# Patient Record
Sex: Female | Born: 1995
Health system: Southern US, Community
[De-identification: ages and names within clinical notes are randomized; demographics above are authoritative.]

## PROBLEM LIST (undated history)

## (undated) ENCOUNTER — Ambulatory Visit (HOSPITAL_COMMUNITY): Disposition: A | Discharge status: Home / Self Care | Payer: Medicaid Other

## (undated) DIAGNOSIS — M2142 Flat foot [pes planus] (acquired), left foot: Secondary | ICD-10-CM

## (undated) DIAGNOSIS — N83209 Unspecified ovarian cyst, unspecified side: Secondary | ICD-10-CM

## (undated) DIAGNOSIS — F4310 Post-traumatic stress disorder, unspecified: Secondary | ICD-10-CM

## (undated) DIAGNOSIS — F329 Major depressive disorder, single episode, unspecified: Secondary | ICD-10-CM

## (undated) DIAGNOSIS — F32A Depression, unspecified: Secondary | ICD-10-CM

## (undated) DIAGNOSIS — M2141 Flat foot [pes planus] (acquired), right foot: Secondary | ICD-10-CM

## (undated) DIAGNOSIS — Z9109 Other allergy status, other than to drugs and biological substances: Secondary | ICD-10-CM

## (undated) DIAGNOSIS — F1290 Cannabis use, unspecified, uncomplicated: Secondary | ICD-10-CM

## (undated) DIAGNOSIS — J4540 Moderate persistent asthma, uncomplicated: Secondary | ICD-10-CM

## (undated) DIAGNOSIS — R1115 Cyclical vomiting syndrome unrelated to migraine: Secondary | ICD-10-CM

## (undated) HISTORY — PX: NOSE SURGERY: SHX723

## (undated) HISTORY — DX: Flat foot (pes planus) (acquired), right foot: M21.41

## (undated) HISTORY — DX: Moderate persistent asthma, uncomplicated: J45.40

## (undated) HISTORY — DX: Flat foot (pes planus) (acquired), right foot: M21.42

## (undated) HISTORY — DX: Cannabis use, unspecified, uncomplicated: F12.90

## (undated) HISTORY — DX: Other allergy status, other than to drugs and biological substances: Z91.09

---
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
## 2014-05-07 ENCOUNTER — Emergency Department (HOSPITAL_COMMUNITY)
Admission: EM | Admit: 2014-05-07 | Discharge: 2014-05-07 | Disposition: A | Discharge status: Home / Self Care | Payer: Medicaid Other | Attending: Emergency Medicine | Admitting: Emergency Medicine

## 2014-05-07 ENCOUNTER — Encounter (HOSPITAL_COMMUNITY): Payer: Self-pay | Admitting: Emergency Medicine

## 2014-05-07 DIAGNOSIS — Z8739 Personal history of other diseases of the musculoskeletal system and connective tissue: Secondary | ICD-10-CM | POA: Diagnosis not present

## 2014-05-07 DIAGNOSIS — Z8659 Personal history of other mental and behavioral disorders: Secondary | ICD-10-CM | POA: Diagnosis not present

## 2014-05-07 DIAGNOSIS — Y939 Activity, unspecified: Secondary | ICD-10-CM | POA: Diagnosis not present

## 2014-05-07 DIAGNOSIS — Y929 Unspecified place or not applicable: Secondary | ICD-10-CM | POA: Insufficient documentation

## 2014-05-07 DIAGNOSIS — Z87891 Personal history of nicotine dependence: Secondary | ICD-10-CM | POA: Insufficient documentation

## 2014-05-07 DIAGNOSIS — S70361A Insect bite (nonvenomous), right thigh, initial encounter: Secondary | ICD-10-CM

## 2014-05-07 DIAGNOSIS — J45909 Unspecified asthma, uncomplicated: Secondary | ICD-10-CM | POA: Insufficient documentation

## 2014-05-07 DIAGNOSIS — T148 Other injury of unspecified body region: Secondary | ICD-10-CM | POA: Diagnosis present

## 2014-05-07 DIAGNOSIS — S90569A Insect bite (nonvenomous), unspecified ankle, initial encounter: Secondary | ICD-10-CM | POA: Diagnosis not present

## 2014-05-07 DIAGNOSIS — W57XXXA Bitten or stung by nonvenomous insect and other nonvenomous arthropods, initial encounter: Principal | ICD-10-CM

## 2014-05-07 MED ORDER — SULFAMETHOXAZOLE-TRIMETHOPRIM 800-160 MG PO TABS: 1.0000 | ORAL_TABLET | Freq: Two times a day (BID) | ORAL | Status: DC

## 2014-05-07 MED ORDER — CEPHALEXIN 500 MG PO CAPS: ORAL_CAPSULE | ORAL | Status: DC

## 2014-05-07 NOTE — ED Notes (Signed)
Patient states she might have a spider bite to the right thigh at the tatoo  States it is bruising and it hurts

## 2014-05-07 NOTE — ED Provider Notes (Signed)
CSN: [id number]     Arrival date & time 05/07/14  1921 History   This chart was scribed for non-physician provider Allen DerryMercedes Camprubi-Soms, PA-C, working with Shanna CiscoMegan E Docherty, MD by Phillis HaggisGabriella Gaje, ED Scribe. This patient was seen in room TR08C/TR08C and patient care was started at 7:55 PM.     Chief Complaint  Patient presents with  . Insect Bite   Patient is a 18 y.o. female presenting with animal bite. The history is provided by the patient. No language interpreter was used.  Animal Bite Contact animal:  Insect Location:  Leg Leg injury location:  R upper leg Time since incident:  1 day Pain details:    Quality:  Stinging   Severity:  Mild   Timing:  Constant   Progression:  Worsening Incident location:  Unable to specify Relieved by:  None tried Worsened by:  Nothing tried Ineffective treatments: alcohol rub. Associated symptoms: no fever, no numbness, no rash and no swelling    HPI Comments: Mia Johnson is a 18 y.o. female who presents to the Emergency Department complaining of an insect bite on her right upper thigh onset one day ago, reporting some redness and tenderness to the area. She reports that she does not remember being bit, and noticed the area yesterday, although the pain started only today. She said that the area has spread slightly over the past day. She reports the pain is 5/10, feels like it is "stinging", non-radiating. She states that the pain is only worsened when the area is palpated. She states that she rubbed alcohol on the affected area to no relief. She denies fevers/chills, abd pain, N/V/D, numbness, weakness, headache, blurry vision, myalgias or arthralgias. Denies warmth to the area or itching.  She has a tattoo at the area and was concerned that it might be infected. She denies any other medical problems.    Past Medical History  Diagnosis Date  . Asthma, moderate persistent   . Pes planus (flat feet)   . Environmental allergies   . Marijuana  smoker   . Abdominal pain   . Depression   . PTSD (post-traumatic stress disorder)    Past Surgical History  Procedure Laterality Date  . Nose surgery     Family History  Problem Relation Age of Onset  . Obesity Mother    History  Substance Use Topics  . Smoking status: Former Smoker    Quit date: 03/07/2014  . Smokeless tobacco: Never Used     Comment: smokes marajuana at times  . Alcohol Use: Yes     Comment: ocassionally   OB History   Grav Para Term Preterm Abortions TAB SAB Ect Mult Living                 Review of Systems  Constitutional: Negative for fever and chills.  Eyes: Negative for pain and visual disturbance.  Respiratory: Negative for chest tightness, shortness of breath and wheezing.   Cardiovascular: Negative for chest pain and leg swelling.  Gastrointestinal: Negative for nausea, vomiting, abdominal pain and diarrhea.  Musculoskeletal: Negative for arthralgias, back pain, joint swelling and myalgias.  Skin: Negative for rash.  Neurological: Negative for weakness, numbness and headaches.      Allergies  Shellfish-derived products  Home Medications   Prior to Admission medications   Medication Sig Start Date End Date Taking? Authorizing Provider  cephALEXin (KEFLEX) 500 MG capsule 2 caps po bid x 7 days 05/07/14   Donnita FallsMercedes Strupp Camprubi-Soms, PA-C  pseudoephedrine (  SUDAFED) 60 MG tablet Take 1 tablet (60 mg total) by mouth every 6 (six) hours as needed for congestion. 12/14/13   Vale HavenKeli L Beck, MD  sulfamethoxazole-trimethoprim (BACTRIM DS,SEPTRA DS) 800-160 MG per tablet Take 1 tablet by mouth 2 (two) times daily. 05/07/14   Benedetto Ryder Strupp Camprubi-Soms, PA-C   BP 146/78  Pulse 57  Temp(Src) 98.7 F (37.1 C) (Oral)  Resp 18  Ht 5\' 9"  (1.753 m)  Wt 132 lb 6 oz (60.045 kg)  BMI 19.54 kg/m2  SpO2 98%  LMP 04/22/2014 Physical Exam  Nursing note and vitals reviewed. Constitutional: She is oriented to person, place, and time. Vital signs are  normal. She appears well-developed and well-nourished. No distress.  HENT:  Head: Normocephalic and atraumatic.  Mouth/Throat: Mucous membranes are normal.  Eyes: Conjunctivae and EOM are normal. Pupils are equal, round, and reactive to light.  Neck: Normal range of motion. Neck supple.  Cardiovascular: Normal rate and normal pulses.   Pulmonary/Chest: Effort normal. No respiratory distress. She exhibits no tenderness.  Abdominal: Normal appearance.  Musculoskeletal: Normal range of motion.  FROM intact in all extremities  Neurological: She is alert and oriented to person, place, and time. She has normal strength. No sensory deficit.  Skin: Skin is warm, dry and intact. There is erythema.     Small area <1cm on mid-upper right thigh of trace erythema, with no edema, heat or TTP. No fluctuance or induration. No urticaria, pustules, or vesicles. No other rashes over exposed areas. No bite mark or puncture marks.  Psychiatric: She has a normal mood and affect. Her behavior is normal.    ED Course  Procedures (including critical care time) DIAGNOSTIC STUDIES: Oxygen Saturation is 98% on room air, normal by my interpretation.    COORDINATION OF CARE: 8:00 PM-Discussed treatment plan which includes antibiotics and f/u with PCP with pt at bedside and pt agreed to plan.    Labs Review Labs Reviewed - No data to display  Imaging Review No results found.   EKG Interpretation None      MDM   Final diagnoses:  Insect bite of thigh, right, initial encounter    Mia Johnson is a 18 y.o. female presenting s/p unknown insect bite to R thigh 1 day ago. Subjective reports of increased redness. Exam unremarkable for puncture wound, there is mild trace erythema to area with no TTP and with no obvious abscess. Will tx as cellulitis given pt concern with infection due to location near tattoo.Will have pt f/up with PCP. Pt not itching, therefore doubt scabies, and no need for  hydrocortisone now. I explained the diagnosis and have given explicit precautions to return to the ER including for any other new or worsening symptoms. The patient understands and accepts the medical plan as it's been dictated and I have answered their questions. Discharge instructions concerning home care and prescriptions have been given. The patient is STABLE and is discharged to home in good condition.  I personally performed the services described in this documentation, which was scribed in my presence. The recorded information has been reviewed and is accurate.  BP 146/78  Pulse 57  Temp(Src) 98.7 F (37.1 C) (Oral)  Resp 18  Ht 5\' 9"  (1.753 m)  Wt 132 lb 6 oz (60.045 kg)  BMI 19.54 kg/m2  SpO2 98%  LMP 04/22/2014    Donnita FallsMercedes Strupp Camprubi-Soms, PA-C 05/08/14 [phone]

## 2014-05-07 NOTE — Discharge Instructions (Signed)
Keep area clean and dry. Apply warm compresses to affected area throughout the day. Take antibiotic until it is finished. Followup with your Primary Care doctor in 7 days for wound recheck.  Return to emergency department for emergent changing or worsening symptoms.   Insect Bite Mosquitoes, flies, fleas, bedbugs, and other insects can bite. Insect bites are different from insect stings. The bite may be red, puffy (swollen), and itchy for 2 to 4 days. Most bites get better on their own. HOME CARE   Do not scratch the bite.  Keep the bite clean and dry. Wash the bite with soap and water.  Put ice on the bite.  Put ice in a plastic bag.  Place a towel between your skin and the bag.  Leave the ice on for 20 minutes, 4 times a day. Do this for the first 2 to 3 days, or as told by your doctor.  You may use medicated lotions or creams to lessen itching as told by your doctor.  Only take medicines as told by your doctor.  If you are given medicines (antibiotics), take them as told. Finish them even if you start to feel better. You may need a tetanus shot if:  You cannot remember when you had your last tetanus shot.  You have never had a tetanus shot.  The injury broke your skin. If you need a tetanus shot and you choose not to have one, you may get tetanus. Sickness from tetanus can be serious. GET HELP RIGHT AWAY IF:   You have more pain, redness, or puffiness.  You see a red line on the skin coming from the bite.  You have a fever.  You have joint pain.  You have a headache or neck pain.  You feel weak.  You have a rash.  You have chest pain, or you are short of breath.  You have belly (abdominal) pain.  You feel sick to your stomach (nauseous) or throw up (vomit).  You feel very tired or sleepy. MAKE SURE YOU:   Understand these instructions.  Will watch your condition.  Will get help right away if you are not doing well or get worse. Document Released:  10/18/2000 Document Revised: 01/13/2012 Document Reviewed: 05/22/2011 [facility] Patient Information 2015 [employer] [place]. This information is not intended to replace advice given to you by your health care provider. Make sure you discuss any questions you have with your health care provider.

## 2014-05-08 ENCOUNTER — Telehealth: Payer: Self-pay | Admitting: Family Medicine

## 2014-05-08 NOTE — ED Provider Notes (Signed)
Medical screening examination/treatment/procedure(s) were performed by non-physician practitioner and as supervising physician I was immediately available for consultation/collaboration.  Shanna CiscoMegan E Kilynn Fitzsimmons, MD 05/08/14 [phone]

## 2014-05-08 NOTE — Telephone Encounter (Signed)
Mia Johnson is a 18 y.o. female calls because she feels her tattoo may be infected. She states her tattoo is 531 1/19 years old and she gave it to herself.  She states she was seen in the ED yesterday and was told it was not infected and then given 2 antibiotics. She has concerns that she has advanced to a blood stream infection because it is starting to "bruise" through her tattoo (more than when seen yesterday). She endorses no appetite, with nausea, vomit when she attempts to eat and chills. She denies diarrhea.  - With new onset of symptoms mild concern for infection advancement. encouraged pt to be seen in an urgent care or ED and they will perform blood work and re-evaluate her skin infection.  Kuneff, Renee DO

## 2014-07-20 ENCOUNTER — Encounter: Payer: Self-pay | Admitting: Family Medicine

## 2014-08-02 ENCOUNTER — Ambulatory Visit (INDEPENDENT_AMBULATORY_CARE_PROVIDER_SITE_OTHER): Payer: Medicaid Other | Admitting: Family Medicine

## 2014-08-02 ENCOUNTER — Encounter: Payer: Self-pay | Admitting: Family Medicine

## 2014-08-02 VITALS — BP 106/64 | HR 87 | Temp 98.2°F | Wt 126.0 lb

## 2014-08-02 DIAGNOSIS — Z23 Encounter for immunization: Secondary | ICD-10-CM | POA: Diagnosis not present

## 2014-08-02 DIAGNOSIS — G43909 Migraine, unspecified, not intractable, without status migrainosus: Secondary | ICD-10-CM

## 2014-08-02 DIAGNOSIS — Z0000 Encounter for general adult medical examination without abnormal findings: Secondary | ICD-10-CM | POA: Diagnosis not present

## 2014-08-02 DIAGNOSIS — Z00129 Encounter for routine child health examination without abnormal findings: Secondary | ICD-10-CM

## 2014-08-02 NOTE — Progress Notes (Signed)
Patient ID: Mia Johnson, female   DOB: 05/17/1996, 18 y.o.   MRN: [id number]   Subjective:    Patient ID: Mia Johnson, female    DOB: 09/09/1996, 18 y.o.   MRN: [id number]  HPI  CC: to be "checked all over"  # Annual:  Due for flu shot  Sexually active with female only (girlfriend), declines STD testing as she says she was negative before dating her current GF and they are "on the same page" so she is not worried about infection ROS: no CP, no SOB, +myalgias x 1 day back of legs, no dysuria  # Headaches / eyes rolling  Has long history of migraines and seen before for this  Location: primarily right front behind eye.  Associated symptoms: right ear tinnitus, photophobia, phonophobia  Currently takes only ibuprofen  Had episode 2 days ago where her "eyes rolled in the back of my head" when she was trying to go to sleep  2 weeks ago had "lazy" eye: girlfriend says her left eye deviated left and when she told patient it returned to normal. Patient denies any diplopia or vision changes when this occurred. ROS: no slurred speech, no syncope, no seizure, no weakness/numbness of extremities, no changes in vision, +possible change in taste (no longer enjoys sweets)  Review of Systems   See HPI for ROS. All other systems reviewed and are negative.  Past medical history, surgical, family, and social history reviewed and updated in the EMR as appropriate. Objective:  BP 106/64  Pulse 87  Temp(Src) 98.2 F (36.8 C) (Oral)  Wt 126 lb (57.153 kg)  LMP 07/14/2014 Vitals reviewed  General: NAD, thin appearing AA female HEENT: PERRL, EOMI. MMM.  Neck: supple, FROM CV: RRR, normal s1 and s2, no murmurs. 2+ radial and PT pulses bilat Resp: CTAB, normal effort Abdomen: thin, soft, nontender, nondistended, normal bowel sounds Ext: no edema or cyanosis, WWP. Neuro: alert and oriented. CN2-12 normal. Finger to nose, heel-to-shin normal. No pronator drift. Strength 5/5  bilaterally in: arm flexion/extension, finger abduction/adduction, leg extension/flexion. Gait is normal.  Assessment & Plan:  See Problem List Documentation

## 2014-08-02 NOTE — Assessment & Plan Note (Signed)
Pt with strong history of migraine and endorsing continued symptoms, with also some recent ocular sequelae that may be related. She is concerned about possible brain tumor, however her neuro exam is completely normal today. I have asked her to take a daily headache log and f/u in 1 month to discuss this and possibly initiate daily prophylaxis. On chart review it appears she has taken imitrex once in the past and trazodone as well.

## 2014-08-02 NOTE — Patient Instructions (Signed)
Headache diary:  Date, time of headache Location of headache, type of pain Associated symptoms (ear ringing, changes in vision, etc) How long it lasts Medications you take or things that help make it better Anything that brings it on  Do this for about 1 month and then reschedule a visit with me.

## 2014-08-12 ENCOUNTER — Ambulatory Visit (INDEPENDENT_AMBULATORY_CARE_PROVIDER_SITE_OTHER): Payer: Medicaid Other | Admitting: Family Medicine

## 2014-08-12 ENCOUNTER — Telehealth: Payer: Self-pay | Admitting: Family Medicine

## 2014-08-12 ENCOUNTER — Encounter: Payer: Self-pay | Admitting: Family Medicine

## 2014-08-12 VITALS — BP 125/47 | HR 73 | Temp 98.4°F | Ht 69.0 in | Wt 126.0 lb

## 2014-08-12 DIAGNOSIS — N898 Other specified noninflammatory disorders of vagina: Secondary | ICD-10-CM | POA: Diagnosis not present

## 2014-08-12 DIAGNOSIS — R300 Dysuria: Secondary | ICD-10-CM

## 2014-08-12 LAB — POCT URINALYSIS DIPSTICK
Bilirubin, UA: NEGATIVE
Blood, UA: NEGATIVE
GLUCOSE UA: NEGATIVE
Ketones, UA: NEGATIVE
Leukocytes, UA: NEGATIVE
Nitrite, UA: NEGATIVE
PH UA: 6.5
Protein, UA: NEGATIVE
SPEC GRAV UA: 1.02
Urobilinogen, UA: 1

## 2014-08-12 LAB — POCT WET PREP (WET MOUNT): CLUE CELLS WET PREP WHIFF POC: NEGATIVE

## 2014-08-12 MED ORDER — METRONIDAZOLE 500 MG PO TABS: 500.0000 mg | ORAL_TABLET | Freq: Two times a day (BID) | ORAL | Status: DC

## 2014-08-12 NOTE — Assessment & Plan Note (Signed)
Patient with negative urinalysis today. Wet prep collected, will call patient with results.

## 2014-08-12 NOTE — Telephone Encounter (Signed)
Please cal pt, she has a mild bacterial infection. I have called in metronidazole for her to take for 7 days. Thank you.

## 2014-08-12 NOTE — Progress Notes (Signed)
   Subjective:    Patient ID: Mia Johnson, female    DOB: 07/21/1996, 18 y.o.   MRN: [id number]  HPI Mia Johnson ia a 18 y.o. female presents to family medicine clinic for same-day visit  Dysuria: Patient presents today with 5 days of dysuria. She states that it feels like a pressure pain when she attempts to void. She is voiding more often, with incomplete emptying of bladder. She has a history of UTIs, and feels this feels the same as her other UTIs. She has attempted to increase her volume of water intake, but this has not helped. She denies fever, but endorses right sided abdominal pain. She states that her pain hurts worse when she is running. She denies changes in color of her urine, but states that she feels it has an odor. She is eating and drinking okay, it has been slightly diminished because she states she is feeling more depressed lately. She is currently on her menses. Patient's last menstrual period was 08/10/2014. Patient's mother has history of kidney stones.  Review of Systems Per history of present illness    Objective:   Physical Exam BP 125/47  Pulse 73  Temp(Src) 98.4 F (36.9 C) (Oral)  Ht 5\' 9"  (1.753 m)  Wt 117 lb 12.8 oz (53.434 kg)  BMI 17.39 kg/m2  LMP 07/14/2014 Gen: Afebrile, pleasant, cooperative female, no acute distress, nontoxic in appearance, well-developed, well-nourished. Abd: Soft. Flat NTND. BS present. No Masses palpated.  MSK: No CVA tenderness bilaterally  GYN:  External genitalia within normal limits.  Vaginal mucosa pink, moist, normal rugae.  Nonfriable cervix without lesions, no discharge. Patient currently on her menses.   Bimanual exam revealed normal, nongravid uterus.  No cervical motion tenderness. No adnexal masses bilaterally.        Assessment & Plan:

## 2014-08-12 NOTE — Telephone Encounter (Signed)
Spoke with patient and informed her of below 

## 2014-08-12 NOTE — Patient Instructions (Signed)
Safe Sex Safe sex is about reducing the risk of giving or getting a sexually transmitted disease (STD). STDs are spread through sexual contact involving the genitals, mouth, or rectum. Some STDs can be cured and others cannot. Safe sex can also prevent unintended pregnancies.  WHAT ARE SOME SAFE SEX PRACTICES?  Limit your sexual activity to only one partner who is having sex with only you.  Talk to your partner about his or her past partners, past STDs, and drug use.  Use a condom every time you have sexual intercourse. This includes vaginal, oral, and anal sexual activity. Both females and males should wear condoms during oral sex. Only use latex or polyurethane condoms and water-based lubricants. Using petroleum-based lubricants or oils to lubricate a condom will weaken the condom and increase the chance that it will break. The condom should be in place from the beginning to the end of sexual activity. Wearing a condom reduces, but does not completely eliminate, your risk of getting or giving an STD. STDs can be spread by contact with infected body fluids and skin.  Get vaccinated for hepatitis B and HPV.  Avoid alcohol and recreational drugs, which can affect your judgment. You may forget to use a condom or participate in high-risk sex.  For females, avoid douching after sexual intercourse. Douching can spread an infection farther into the reproductive tract.  Check your body for signs of sores, blisters, rashes, or unusual discharge. See your health care provider if you notice any of these signs.  Avoid sexual contact if you have symptoms of an infection or are being treated for an STD. If you or your partner has herpes, avoid sexual contact when blisters are present. Use condoms at all other times.  If you are at risk of being infected with HIV, it is recommended that you take a prescription medicine daily to prevent HIV infection. This is called pre-exposure prophylaxis (PrEP). You are  considered at risk if:  You are a man who has sex with other men (MSM).  You are a heterosexual man or woman who is sexually active with more than one partner.  You take drugs by injection.  You are sexually active with a partner who has HIV.  Talk with your health care provider about whether you are at high risk of being infected with HIV. If you choose to begin PrEP, you should first be tested for HIV. You should then be tested every 3 months for as long as you are taking PrEP.  See your health care provider for regular screenings, exams, and tests for other STDs. Before having sex with a new partner, each of you should be screened for STDs and should talk about the results with each other. WHAT ARE THE BENEFITS OF SAFE SEX?   There is less chance of getting or giving an STD.  You can prevent unwanted or unintended pregnancies.  By discussing safe sex concerns with your partner, you may increase feelings of intimacy, comfort, trust, and honesty between the two of you. Document Released: 11/28/2004 Document Revised: 03/07/2014 Document Reviewed: 04/13/2012 [facility] [employer] Patient Information 2015 [employer] [place]. This information is not intended to replace advice given to you by your health care provider. Make sure you discuss any questions you have with your health care provider.  Your urine looked completely normal today. We will call you with the results of your wet prep this afternoon.

## 2014-08-18 ENCOUNTER — Emergency Department (HOSPITAL_COMMUNITY): Payer: Medicaid Other

## 2014-08-18 ENCOUNTER — Emergency Department (HOSPITAL_COMMUNITY)
Admission: EM | Admit: 2014-08-18 | Discharge: 2014-08-18 | Disposition: A | Discharge status: Home / Self Care | Payer: Medicaid Other | Attending: Emergency Medicine | Admitting: Emergency Medicine

## 2014-08-18 ENCOUNTER — Encounter (HOSPITAL_COMMUNITY): Payer: Self-pay | Admitting: Emergency Medicine

## 2014-08-18 DIAGNOSIS — Z8659 Personal history of other mental and behavioral disorders: Secondary | ICD-10-CM | POA: Diagnosis not present

## 2014-08-18 DIAGNOSIS — S0011XA Contusion of right eyelid and periocular area, initial encounter: Secondary | ICD-10-CM

## 2014-08-18 DIAGNOSIS — S0993XA Unspecified injury of face, initial encounter: Secondary | ICD-10-CM | POA: Insufficient documentation

## 2014-08-18 DIAGNOSIS — R6884 Jaw pain: Secondary | ICD-10-CM

## 2014-08-18 DIAGNOSIS — Z87891 Personal history of nicotine dependence: Secondary | ICD-10-CM | POA: Insufficient documentation

## 2014-08-18 DIAGNOSIS — J45909 Unspecified asthma, uncomplicated: Secondary | ICD-10-CM | POA: Insufficient documentation

## 2014-08-18 DIAGNOSIS — S0010XA Contusion of unspecified eyelid and periocular area, initial encounter: Secondary | ICD-10-CM | POA: Insufficient documentation

## 2014-08-18 MED ORDER — IBUPROFEN 800 MG PO TABS: 800.0000 mg | ORAL_TABLET | Freq: Three times a day (TID) | ORAL | Status: DC
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
## 2014-08-21 NOTE — ED Provider Notes (Signed)
Medical screening examination/treatment/procedure(s) were performed by non-physician practitioner and as supervising physician I was immediately available for consultation/collaboration.     Suzi RootsKevin E Lelah Rennaker, MD 08/21/14 [id number]

## 2014-08-22 ENCOUNTER — Telehealth: Payer: Self-pay | Admitting: Family Medicine
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
## 2014-09-16 ENCOUNTER — Other Ambulatory Visit: Payer: Self-pay | Admitting: Family Medicine

## 2014-09-16 NOTE — Telephone Encounter (Signed)
Refill request for nebulizer solution and albuterol inhaler. Pls call mother once sent to pharmacy.

## 2014-09-19 MED ORDER — ALBUTEROL SULFATE (2.5 MG/3ML) 0.083% IN NEBU: 2.5000 mg | INHALATION_SOLUTION | Freq: Four times a day (QID) | RESPIRATORY_TRACT | Status: DC | PRN

## 2014-09-19 MED ORDER — ALBUTEROL SULFATE HFA 108 (90 BASE) MCG/ACT IN AERS: 2.0000 | INHALATION_SPRAY | Freq: Four times a day (QID) | RESPIRATORY_TRACT | Status: DC | PRN

## 2014-09-19 NOTE — Telephone Encounter (Signed)
It does not appear asthma has been an active issue for patient (Epic chart reviewed up to 2012, there is no mention of asthma and it is not listed as a problem. Under history it is listed there, though). Refill given for 1 month, she will need an appointment for further refills as this issue has not been discussed for >3 years. -Dr. Waynetta SandyWight

## 2014-09-20 NOTE — Telephone Encounter (Signed)
LM for patient to call back.  Please inform of refill for 1 month.  Please help her schedule an appt to follow up with MD. Burnard HawthorneJazmin Hartsell,CMA

## 2014-09-22 ENCOUNTER — Emergency Department (HOSPITAL_COMMUNITY)
Admission: EM | Admit: 2014-09-22 | Discharge: 2014-09-22 | Disposition: A | Discharge status: Home / Self Care | Payer: Medicaid Other | Attending: Emergency Medicine | Admitting: Emergency Medicine

## 2014-09-22 ENCOUNTER — Emergency Department (HOSPITAL_COMMUNITY): Payer: Medicaid Other

## 2014-09-22 ENCOUNTER — Encounter (HOSPITAL_COMMUNITY): Payer: Self-pay | Admitting: Emergency Medicine

## 2014-09-22 DIAGNOSIS — Z8739 Personal history of other diseases of the musculoskeletal system and connective tissue: Secondary | ICD-10-CM | POA: Insufficient documentation

## 2014-09-22 DIAGNOSIS — R0789 Other chest pain: Secondary | ICD-10-CM | POA: Insufficient documentation

## 2014-09-22 DIAGNOSIS — R079 Chest pain, unspecified: Secondary | ICD-10-CM | POA: Diagnosis present

## 2014-09-22 DIAGNOSIS — R42 Dizziness and giddiness: Secondary | ICD-10-CM | POA: Insufficient documentation

## 2014-09-22 DIAGNOSIS — Z8659 Personal history of other mental and behavioral disorders: Secondary | ICD-10-CM | POA: Insufficient documentation

## 2014-09-22 DIAGNOSIS — J4540 Moderate persistent asthma, uncomplicated: Secondary | ICD-10-CM | POA: Diagnosis not present

## 2014-09-22 DIAGNOSIS — Z87891 Personal history of nicotine dependence: Secondary | ICD-10-CM | POA: Insufficient documentation

## 2014-09-22 DIAGNOSIS — Z79899 Other long term (current) drug therapy: Secondary | ICD-10-CM | POA: Insufficient documentation

## 2014-09-22 LAB — BASIC METABOLIC PANEL
Anion gap: 12 (ref 5–15)
BUN: 12 mg/dL (ref 6–23)
CALCIUM: 9.4 mg/dL (ref 8.4–10.5)
CO2: 26 mEq/L (ref 19–32)
Chloride: 103 mEq/L (ref 96–112)
Creatinine, Ser: 0.83 mg/dL (ref 0.50–1.10)
GFR calc Af Amer: >90 mL/min (ref >90)
Glucose, Bld: 96 mg/dL (ref 70–99)
POTASSIUM: 4 meq/L (ref 3.7–5.3)
SODIUM: 141 meq/L (ref 137–147)

## 2014-09-22 LAB — CBC
HCT: 38.5 % (ref 36.0–46.0)
Hemoglobin: 12.8 g/dL (ref 12.0–15.0)
MCH: 27.7 pg (ref 26.0–34.0)
MCHC: 33.2 g/dL (ref 30.0–36.0)
MCV: 83.3 fL (ref 78.0–100.0)
PLATELETS: 235 10*3/uL (ref 150–400)
RBC: 4.62 MIL/uL (ref 3.87–5.11)
RDW: 12.6 % (ref 11.5–15.5)
WBC: 4.9 10*3/uL (ref 4.0–10.5)

## 2014-09-22 LAB — I-STAT TROPONIN, ED: Troponin i, poc: 0 ng/mL (ref 0.00–0.08)

## 2014-09-22 MED ORDER — IBUPROFEN 800 MG PO TABS: 800.0000 mg | ORAL_TABLET | Freq: Three times a day (TID) | ORAL | Status: DC | PRN

## 2014-09-22 MED ORDER — IBUPROFEN 800 MG PO TABS
800.0000 mg | ORAL_TABLET | Freq: Once | ORAL | Status: AC
  Administered 2014-09-22: 800 mg via ORAL
  Filled 2014-09-22: qty 1

## 2014-09-22 NOTE — ED Notes (Signed)
Patient states started having chest pain x 20 minutes ago.   Patient states no radiation.   Patient states she did get dizzy when she started having chest pain but denies other symptoms.

## 2014-09-22 NOTE — ED Provider Notes (Signed)
TIME SEEN: 3:45 PM  CHIEF COMPLAINT: Chest pain  HPI: Pt is a 18 y.o. F with h/o of asthma who presents to the emergency department with left sharp chest pain that started around 2:15 today. Patient denies any aggravating or relieving factors. No radiation of pain. No shortness of breath, nausea, vomiting or diarrhea. No diaphoresis. She did have one episode of lightheadedness. She also reports earlier today she was experiencing a "allergy attack". She states that she felt congested, sneezed multiple times, throat felt itchy and felt like it was closing. She is not sure what causes this but took 50 mg of Benadryl and reports feeling better.  Denies any recent fevers, cough or illness.  Patient denies a history of PE or DVT, recent prolonged immobilization such as long flight or hospitalization, fracture, surgery, trauma, exogenous hormone use or tobacco use. No prior history of hypertension, diabetes, hyperlipidemia or family history of premature CAD.  ROS: See HPI Constitutional: no fever  Eyes: no drainage  ENT: no runny nose   Cardiovascular:   chest pain  Resp: no SOB  GI: no vomiting GU: no dysuria Integumentary: no rash  Allergy: no hives  Musculoskeletal: no leg swelling  Neurological: no slurred speech ROS otherwise negative  PAST MEDICAL HISTORY/PAST SURGICAL HISTORY:  Past Medical History  Diagnosis Date  . Asthma, moderate persistent   . Pes planus (flat feet)   . Environmental allergies   . Marijuana smoker   . Abdominal pain   . Depression   . PTSD (post-traumatic stress disorder)     MEDICATIONS:  Prior to Admission medications   Medication Sig Start Date End Date Taking? Authorizing Provider  albuterol (PROVENTIL HFA;VENTOLIN HFA) 108 (90 BASE) MCG/ACT inhaler Inhale 2 puffs into the lungs every 6 (six) hours as needed for wheezing or shortness of breath. 09/19/14   Nani RavensAndrew M Wight, MD  albuterol (PROVENTIL) (2.5 MG/3ML) 0.083% nebulizer solution Take 3 mLs (2.5 mg  total) by nebulization every 6 (six) hours as needed for wheezing or shortness of breath. 09/19/14   Nani RavensAndrew M Wight, MD  ibuprofen (ADVIL,MOTRIN) 800 MG tablet Take 1 tablet (800 mg total) by mouth 3 (three) times daily. 08/18/14   Santiago GladHeather Laisure, PA-C    ALLERGIES:  Allergies  Allergen Reactions  . Shellfish-Derived Products     Positive allergy test    SOCIAL HISTORY:  History  Substance Use Topics  . Smoking status: Former Smoker    Quit date: 03/07/2014  . Smokeless tobacco: Never Used     Comment: smokes marajuana at times  . Alcohol Use: Yes     Comment: ocassionally    FAMILY HISTORY: Family History  Problem Relation Age of Onset  . Obesity Mother     EXAM: BP 118/57 mmHg  Pulse 72  Temp(Src) 98.7 F (37.1 C) (Oral)  Resp 22  SpO2 99%  LMP 08/13/2014 CONSTITUTIONAL: Alert and oriented and responds appropriately to questions. Well-appearing; well-nourished HEAD: Normocephalic EYES: Conjunctivae clear, PERRL ENT: normal nose; no rhinorrhea; moist mucous membranes; pharynx without lesions noted NECK: Supple, no meningismus, no LAD  CARD: RRR; S1 and S2 appreciated; no murmurs, no clicks, no rubs, no gallops CHEST:  Patient's chest wall is tender to palpation over the left side without crepitus or ecchymosis or deformity, no lesions noted over the chest wall RESP: Normal chest excursion without splinting or tachypnea; breath sounds clear and equal bilaterally; no wheezes, no rhonchi, no rales, no hypoxia or respiratory distress, lungs are clear to auscultation with  good aeration diffusely ABD/GI: Normal bowel sounds; non-distended; soft, non-tender, no rebound, no guarding BACK:  The back appears normal and is non-tender to palpation, there is no CVA tenderness EXT: Normal ROM in all joints; non-tender to palpation; no edema; normal capillary refill; no cyanosis; no calf tenderness or swelling    SKIN: Normal color for age and race; warm NEURO: Moves all  extremities equally PSYCH: The patient's mood and manner are appropriate. Grooming and personal hygiene are appropriate.  MEDICAL DECISION MAKING: Pt here with what appears to be chest wall pain. She has no risk factors for ACS or pulmonary embolus. She is PERC negative and has a HEART score of 0.  EKG does show some J-point elevation and mild ST elevation may be secondary to early repolarization versus pericarditis. She has no friction rub exam, denies any recent URI symptoms, no change in pain with changing of position. Discussed with patient that this could be pericarditis versus chest wall pain but that either way both are treated with high-dose NSAIDs. I feel she is safe to be discharged home. Labs ordered in triage of been unremarkable. EKG is nonischemic.  ED PROGRESS: Chest x-ray clear. We'll discharge home with prescription for ibuprofen, return precautions and supportive care instructions. She verbalized standing and is comfortable with plan.     EKG Interpretation  Date/Time:  Thursday September 22 2014 14:55:00 EST Ventricular Rate:  84 PR Interval:  128 QRS Duration: 94 QT Interval:  362 QTC Calculation: 427 R Axis:   90 Text Interpretation:  Normal sinus rhythm Rightward axis ST elevation, consider early repolarization, pericarditis, or injury Abnormal ECG No old tracing to compare Confirmed by Vaudie Engebretsen,  Mia Johnson, Adore Kithcart [phone] on 09/22/2014 3:42:05 PM         Mia MawKristen N Dianey Suchy, Mia Johnson 09/22/14 1640

## 2014-09-22 NOTE — Discharge Instructions (Signed)
Your labs and chest x-ray were normal today. Her pain may be secondary to chest wall pain from a muscle strain versus pericarditis. Both of these conditions may cause chest pain and her treated with high-dose anti-inflammatories such as ibuprofen.   Chest Wall Pain Chest wall pain is pain in or around the bones and muscles of your chest. It may take up to 6 weeks to get better. It may take longer if you must stay physically active in your work and activities.  CAUSES  Chest wall pain may happen on its own. However, it may be caused by:  A viral illness like the flu.  Injury.  Coughing.  Exercise.  Arthritis.  Fibromyalgia.  Shingles. HOME CARE INSTRUCTIONS   Avoid overtiring physical activity. Try not to strain or perform activities that cause pain. This includes any activities using your chest or your abdominal and side muscles, especially if heavy weights are used.  Put ice on the sore area.  Put ice in a plastic bag.  Place a towel between your skin and the bag.  Leave the ice on for 15-20 minutes per hour while awake for the first 2 days.  Only take over-the-counter or prescription medicines for pain, discomfort, or fever as directed by your caregiver. SEEK IMMEDIATE MEDICAL CARE IF:   Your pain increases, or you are very uncomfortable.  You have a fever.  Your chest pain becomes worse.  You have new, unexplained symptoms.  You have nausea or vomiting.  You feel sweaty or lightheaded.  You have a cough with phlegm (sputum), or you cough up blood. MAKE SURE YOU:   Understand these instructions.  Will watch your condition.  Will get help right away if you are not doing well or get worse. Document Released: 10/21/2005 Document Revised: 01/13/2012 Document Reviewed: 06/17/2011 [facility] Patient Information 2015 [employer], [place]. This information is not intended to replace advice given to you by your health care provider. Make sure you discuss any questions you  have with your health care provider.   Possible Pericarditis Pericarditis is swelling (inflammation) of the pericardium. The pericardium is a thin, double-layered, fluid-filled tissue sac that surrounds the heart. The purpose of the pericardium is to contain the heart in the chest cavity and keep the heart from overexpanding. Different types of pericarditis can occur, such as:  Acute pericarditis. Inflammation can develop suddenly in acute pericarditis.  Chronic pericarditis. Inflammation develops gradually and is long-lasting in chronic pericarditis.  Constrictive pericarditis. In this type of pericarditis, the layers of the pericardium stiffen and develop scar tissue. The scar tissue thickens and sticks together. This makes it difficult for the heart to pump and work as it normally does. CAUSES  Pericarditis can be caused from different conditions, such as:  A bacterial, fungal or viral infection.  After a heart attack (myocardial infarction).  After open-heart surgery (coronary bypass graft surgery).  Auto-immune conditions such as lupus, rheumatoid arthritis or scleroderma.  Kidney failure.  Low thyroid condition (hypothyroidism).  Cancer from another part of the body that has spread (metastasized) to the pericardium.  Chest injury or trauma.  After radiation treatment.  Certain medicines. SYMPTOMS  Symptoms of pericarditis can include:  Chest pain. Chest pain symptoms may increase when laying down and may be relieved when sitting up and leaning forward.  A chronic, dry cough.  Heart palpitations. These may feel like rapid, fluttering or pounding heart beats.  Chest pain may be worse when swallowing.  Dizziness or fainting.  Tiredness, fatigue  or lethargy.  Fever. DIAGNOSIS  Pericarditis is diagnosed by the following:  A physical exam. A heart sound called a pericardial friction rub may be heard when your caregiver listens to your heart.  Blood work. Blood  may be drawn to check for an infection and to look at your blood chemistry.  Electrocardiography. During electrocardiography your heart's electrical activity is monitored and recorded with a tracing on paper (electrocardiogram [ECG]).  Echocardiography.  Computed tomography (CT).  Magnetic resonance image (MRI). TREATMENT  To treat pericarditis, it is important to know the cause of it. The cause of pericarditis determines the treatment.   If the cause of pericarditis is due to an infection, treatment is based on the type of infection. If an infection is suspected in the pericardial fluid, a procedure called a pericardial fluid culture and biopsy may be done. This takes a sample of the pericardial fluid. The sample is sent to a lab which runs tests on the pericardial fluid to check for an infection.  If the autoimmune disease is the cause, treatment of the autoimmune condition will help improve the pericarditis.  If the cause of pericarditis is not known, anti-inflammatory medicines may be used to help decrease the inflammation.  Surgery may be needed. The following are types of surgeries or procedures that may be done to treat pericarditis:  Pericardial window. A pericardial window makes a cut (incision) into the pericardial sac. This allows excess fluid in the pericardium to drain.  Pericardiocentesis. A pericardiocentesis is also known as a pericardial tap. This procedure uses a needle that is guided by X-ray to drain (aspirate) excess fluid from the pericardium.  Pericardiectomy. A pericardiectomy removes part or all of the pericardium. HOME CARE INSTRUCTIONS   Do not smoke. If you smoke, quit. Your caregiver can help you quit smoking.  Maintain a healthy weight.  Follow an exercise program as told by your caregiver.  If you drink alcohol, do so in moderation.  Eat a heart healthy diet. A registered dietician can help you learn about healthy food choices.  Keep a list of all  your medicines with you at all times. Include the name, dose, how often it is taken and how it is taken. SEEK IMMEDIATE MEDICAL CARE IF:   You have chest pain or feelings of chest pressure.  You have sweating (diaphoresis) when at rest.  You have irregular heartbeats (palpitations).  You have rapid, racing heart beats.  You have unexplained fainting episodes.  You feel sick to your stomach (nausea) or vomiting without cause.  You have unexplained weakness. If you develop any of the symptoms which originally made you seek care, call for local emergency medical help. Do not drive yourself to the hospital. Document Released: 04/16/2001 Document Revised: 01/13/2012 Document Reviewed: 10/23/2011 [facility] Patient Information 2015 [place], [place]. This information is not intended to replace advice given to you by your health care provider. Make sure you discuss any questions you have with your health care provider.

## 2014-09-27 NOTE — Telephone Encounter (Signed)
Mother is calling because her daughter needs a epi pen. She also wanted the doctor to go over some test results that were done in the ER. Her mother wants to know if her daughter has fluid around her heart or what the issue is. Mia Jacobsonjw

## 2014-09-27 NOTE — Telephone Encounter (Signed)
Will forward to MD.  Pt has an appt next week.  Bhavya Grand,CMA

## 2014-09-28 ENCOUNTER — Encounter: Payer: Self-pay | Admitting: Family Medicine

## 2014-09-28 ENCOUNTER — Other Ambulatory Visit: Payer: Self-pay | Admitting: *Deleted

## 2014-09-28 NOTE — Telephone Encounter (Signed)
Pt needs a refill on her epipen.  I don't see one on her medication list. Jazmin Hartsell,CMA

## 2014-09-28 NOTE — Progress Notes (Signed)
[facility]  She needs this before going back to school on Monday.

## 2014-09-28 NOTE — Progress Notes (Signed)
Form placed in provider's box.  Mclean Moya,CMA  

## 2014-09-28 NOTE — Progress Notes (Signed)
Mother dropped off forms to be filled out for meds to be given at school.  She needs this by Monday or will not be able to go to school.  Please call when completed.

## 2014-09-28 NOTE — Progress Notes (Signed)
Will forward to MD.  Refill encounter made. Migdalia Olejniczak,CMA

## 2014-09-30 MED ORDER — EPINEPHRINE 0.3 MG/0.3ML IJ SOAJ: 0.3000 mg | Freq: Once | INTRAMUSCULAR | Status: DC

## 2014-10-03 NOTE — Telephone Encounter (Signed)
I called mother and let her know epi pen prescription sent, school forms filled out. Mother of patient had multiple questions regarding recent visits which were answered to best of my ability (I was not the provider for these encounters), and mother agreed we could discuss this at their upcoming visits. Forms for epipen and albuterol for school filled out and left with Tamika. -Dr. Waynetta SandyWight

## 2014-10-03 NOTE — Progress Notes (Signed)
Forms placed up front for parent pick up. Clovis PuMartin, Callee Rohrig L, RN'

## 2014-10-03 NOTE — Progress Notes (Signed)
Forms filled out, I called patient's mother to let her know they were available for pickup, forms left with Tamika. -Dr. Waynetta SandyWight

## 2014-10-04 ENCOUNTER — Ambulatory Visit: Payer: Self-pay | Admitting: Family Medicine

## 2014-10-05 ENCOUNTER — Ambulatory Visit: Payer: Self-pay | Admitting: Family Medicine

## 2014-10-12 ENCOUNTER — Ambulatory Visit (INDEPENDENT_AMBULATORY_CARE_PROVIDER_SITE_OTHER): Payer: Medicaid Other | Admitting: Family Medicine

## 2014-10-12 VITALS — BP 114/72 | HR 60 | Temp 98.1°F | Ht 69.0 in | Wt 124.0 lb

## 2014-10-12 DIAGNOSIS — M79645 Pain in left finger(s): Secondary | ICD-10-CM

## 2014-10-12 DIAGNOSIS — M79646 Pain in unspecified finger(s): Secondary | ICD-10-CM | POA: Insufficient documentation

## 2014-10-12 DIAGNOSIS — R0782 Intercostal pain: Secondary | ICD-10-CM

## 2014-10-12 DIAGNOSIS — R079 Chest pain, unspecified: Secondary | ICD-10-CM | POA: Insufficient documentation

## 2014-10-12 NOTE — Progress Notes (Signed)
   Subjective:    Patient ID: Mia Johnson, female    DOB: 05/25/1996, 18 y.o.   MRN: [id number]  HPI  CC: thumb pain  # Left thumb pain:  Started a few days ago, pulled out hangnail yesterday and it has gotten worse.  Feels warm, very tender, can feel pulse in it, some numbness  Has this issue with same thumb often, at one time had it I&D'd. She does not want to do that today  Did not bleed after pulling out nail, no drainage or pus ROS: no fevers or chills, no nausea/vomiting.  # ED follow up  Went to ED for worsened chest pain (has chronic issue). Did EKG and though musculoskeletal or pericarditis.  Pain is much improved now  Left side of upper chest and radiates underneath left breast  Does not know anything that makes it better or worse ROS: no SOB, no difficulty breathing, no heartburn  Review of Systems   See HPI for ROS. All other systems reviewed and are negative.  Past medical history, surgical, family, and social history reviewed and updated in the EMR as appropriate. Objective:  BP 114/72 mmHg  Pulse 60  Temp(Src) 98.1 F (36.7 C) (Oral)  Ht 5\' 9"  (1.753 m)  Wt 124 lb (56.246 kg)  BMI 18.30 kg/m2  LMP 10/10/2014 (Exact Date) Vitals reviewed  General: NAD CV: RRR, normal heart sounds, no murmurs. 2+ radial and PT pulses bilaterally Resp: clear bilaterally, normal effort Abdomen: soft, nontender, nodistended Ext: left thumb mildly warm and erythematous and tender over medial edge of nail and palmar surface of thumb. No fluctuance, no streaking.        Assessment & Plan:  See Problem List Documentation

## 2014-10-12 NOTE — Assessment & Plan Note (Signed)
This is a chronic issue for her. She likely has musculoskeletal pain or costochondritis. Improved since ED visit. Continue current NSAIDs for pain relief. Consider repeat EKG at next visit to evaluate likely j-point elevation seen on EKG in ED.

## 2014-10-12 NOTE — Patient Instructions (Signed)
I think your thumb is inflammed from pulling the fingernail. This can be very painful for the first 3-5 days. I would recommend trying scheduled tylenol or ibuprofen (meaning take them every 4-6 hours, not necessarily as needed). Also try icing the area (don't put ice directly on the skin) for 15 minutes at a time every few hours.  Reasons to come back more soon include: red streaking coming down the thumb into the hand, you develop severe pain.

## 2014-10-12 NOTE — Assessment & Plan Note (Signed)
Left thumb appears inflammed following hang nail removal yesterday. Does not appear to be acutely infected with abscess that could be drained. Will treat conservatively with tylenol/ibuprofen for pain, cold compresses to help with swelling and inflammation. F/u in 1 week if not improving, or sooner if red flag symptoms appear that were discussed with patient.

## 2014-11-08 ENCOUNTER — Encounter (HOSPITAL_COMMUNITY): Payer: Self-pay

## 2014-11-08 ENCOUNTER — Emergency Department (HOSPITAL_COMMUNITY): Payer: Medicaid Other

## 2014-11-08 ENCOUNTER — Emergency Department (HOSPITAL_COMMUNITY)
Admission: EM | Admit: 2014-11-08 | Discharge: 2014-11-08 | Disposition: A | Discharge status: Home / Self Care | Payer: Medicaid Other | Attending: Emergency Medicine | Admitting: Emergency Medicine

## 2014-11-08 DIAGNOSIS — Z3202 Encounter for pregnancy test, result negative: Secondary | ICD-10-CM | POA: Insufficient documentation

## 2014-11-08 DIAGNOSIS — Z8659 Personal history of other mental and behavioral disorders: Secondary | ICD-10-CM | POA: Diagnosis not present

## 2014-11-08 DIAGNOSIS — J45909 Unspecified asthma, uncomplicated: Secondary | ICD-10-CM | POA: Insufficient documentation

## 2014-11-08 DIAGNOSIS — Z87891 Personal history of nicotine dependence: Secondary | ICD-10-CM | POA: Insufficient documentation

## 2014-11-08 DIAGNOSIS — R1031 Right lower quadrant pain: Secondary | ICD-10-CM | POA: Diagnosis present

## 2014-11-08 DIAGNOSIS — Z79899 Other long term (current) drug therapy: Secondary | ICD-10-CM | POA: Insufficient documentation

## 2014-11-08 DIAGNOSIS — N390 Urinary tract infection, site not specified: Secondary | ICD-10-CM | POA: Diagnosis not present

## 2014-11-08 DIAGNOSIS — N8320 Unspecified ovarian cysts: Secondary | ICD-10-CM | POA: Diagnosis not present

## 2014-11-08 DIAGNOSIS — R52 Pain, unspecified: Secondary | ICD-10-CM

## 2014-11-08 DIAGNOSIS — N83201 Unspecified ovarian cyst, right side: Secondary | ICD-10-CM

## 2014-11-08 LAB — PREGNANCY, URINE: PREG TEST UR: NEGATIVE

## 2014-11-08 LAB — COMPREHENSIVE METABOLIC PANEL
ALBUMIN: 4.6 g/dL (ref 3.5–5.2)
ALT: 17 U/L (ref 0–35)
AST: 20 U/L (ref 0–37)
Alkaline Phosphatase: 80 U/L (ref 39–117)
Anion gap: 6 (ref 5–15)
BILIRUBIN TOTAL: 0.7 mg/dL (ref 0.3–1.2)
BUN: 8 mg/dL (ref 6–23)
CHLORIDE: 106 meq/L (ref 96–112)
CO2: 28 mmol/L (ref 19–32)
Calcium: 9.8 mg/dL (ref 8.4–10.5)
Creatinine, Ser: 0.84 mg/dL (ref 0.50–1.10)
GFR calc Af Amer: >90 mL/min (ref >90)
GLUCOSE: 91 mg/dL (ref 70–99)
Potassium: 4.3 mmol/L (ref 3.5–5.1)
Sodium: 140 mmol/L (ref 135–145)
Total Protein: 7.3 g/dL (ref 6.0–8.3)

## 2014-11-08 LAB — URINALYSIS, ROUTINE W REFLEX MICROSCOPIC
Bilirubin Urine: NEGATIVE
Glucose, UA: NEGATIVE mg/dL
HGB URINE DIPSTICK: NEGATIVE
KETONES UR: NEGATIVE mg/dL
LEUKOCYTES UA: NEGATIVE
Nitrite: NEGATIVE
PH: 6.5 (ref 5.0–8.0)
PROTEIN: 30 mg/dL — AB
Specific Gravity, Urine: 1.024 (ref 1.005–1.030)
Urobilinogen, UA: 1 mg/dL (ref 0.0–1.0)

## 2014-11-08 LAB — URINE MICROSCOPIC-ADD ON

## 2014-11-08 LAB — CBC WITH DIFFERENTIAL/PLATELET
Basophils Absolute: 0 10*3/uL (ref 0.0–0.1)
Basophils Relative: 1 % (ref 0–1)
EOS ABS: 0.1 10*3/uL (ref 0.0–0.7)
Eosinophils Relative: 2 % (ref 0–5)
HCT: 42.6 % (ref 36.0–46.0)
Hemoglobin: 14.3 g/dL (ref 12.0–15.0)
LYMPHS ABS: 1.6 10*3/uL (ref 0.7–4.0)
Lymphocytes Relative: 28 % (ref 12–46)
MCH: 27.9 pg (ref 26.0–34.0)
MCHC: 33.6 g/dL (ref 30.0–36.0)
MCV: 83.2 fL (ref 78.0–100.0)
Monocytes Absolute: 0.5 10*3/uL (ref 0.1–1.0)
Monocytes Relative: 8 % (ref 3–12)
Neutro Abs: 3.7 10*3/uL (ref 1.7–7.7)
Neutrophils Relative %: 63 % (ref 43–77)
Platelets: 285 10*3/uL (ref 150–400)
RBC: 5.12 MIL/uL — AB (ref 3.87–5.11)
RDW: 12.9 % (ref 11.5–15.5)
WBC: 5.9 10*3/uL (ref 4.0–10.5)

## 2014-11-08 MED ORDER — CEPHALEXIN 250 MG PO CAPS
500.0000 mg | ORAL_CAPSULE | Freq: Once | ORAL | Status: AC
  Administered 2014-11-08: 500 mg via ORAL
  Filled 2014-11-08: qty 2

## 2014-11-08 MED ORDER — IBUPROFEN 800 MG PO TABS: 800.0000 mg | ORAL_TABLET | Freq: Three times a day (TID) | ORAL | Status: DC | PRN

## 2014-11-08 MED ORDER — HYDROMORPHONE HCL 1 MG/ML IJ SOLN
0.5000 mg | Freq: Once | INTRAMUSCULAR | Status: AC
  Administered 2014-11-08: 0.5 mg via INTRAVENOUS
  Filled 2014-11-08: qty 1

## 2014-11-08 MED ORDER — SODIUM CHLORIDE 0.9 % IV BOLUS (SEPSIS)
1000.0000 mL | Freq: Once | INTRAVENOUS | Status: AC
  Administered 2014-11-08: 1000 mL via INTRAVENOUS

## 2014-11-08 MED ORDER — ONDANSETRON HCL 4 MG/2ML IJ SOLN
4.0000 mg | Freq: Once | INTRAMUSCULAR | Status: AC
  Administered 2014-11-08: 4 mg via INTRAVENOUS
  Filled 2014-11-08: qty 2

## 2014-11-08 MED ORDER — CEPHALEXIN 500 MG PO CAPS: 500.0000 mg | ORAL_CAPSULE | Freq: Four times a day (QID) | ORAL | Status: DC

## 2014-11-08 MED ORDER — IOHEXOL 300 MG/ML  SOLN
80.0000 mL | Freq: Once | INTRAMUSCULAR | Status: AC | PRN
  Administered 2014-11-08: 80 mL via INTRAVENOUS

## 2014-11-08 MED ORDER — IOHEXOL 300 MG/ML  SOLN
25.0000 mL | Freq: Once | INTRAMUSCULAR | Status: AC | PRN
  Administered 2014-11-08: 25 mL via ORAL

## 2014-11-08 MED ORDER — ONDANSETRON 4 MG PO TBDP: ORAL_TABLET | ORAL | Status: DC

## 2014-11-08 MED ORDER — HYDROCODONE-ACETAMINOPHEN 5-325 MG PO TABS
1.0000 | ORAL_TABLET | Freq: Once | ORAL | Status: AC
  Administered 2014-11-08: 1 via ORAL
  Filled 2014-11-08: qty 1

## 2014-11-08 NOTE — ED Provider Notes (Signed)
CSN: [id number]     Arrival date & time 11/08/14  1503 History   First MD Initiated Contact with Patient 11/08/14 1830     Chief Complaint  Patient presents with  . Abdominal Pain     (Consider location/radiation/quality/duration/timing/severity/associated sxs/prior Treatment) Patient is a 19 y.o. female presenting with abdominal pain. The history is provided by the patient (the pt complains of right lower quadrant pain for one week.).  Abdominal Pain Pain location:  RLQ Pain quality: aching   Pain radiates to:  Does not radiate Pain severity:  Moderate Onset quality:  Gradual Timing:  Constant Progression:  Waxing and waning Chronicity:  New Associated symptoms: no chest pain, no cough, no diarrhea, no fatigue and no hematuria     Past Medical History  Diagnosis Date  . Asthma, moderate persistent   . Pes planus (flat feet)   . Environmental allergies   . Marijuana smoker   . Abdominal pain   . Depression   . PTSD (post-traumatic stress disorder)    Past Surgical History  Procedure Laterality Date  . Nose surgery     Family History  Problem Relation Age of Onset  . Obesity Mother    History  Substance Use Topics  . Smoking status: Former Smoker    Quit date: 03/07/2014  . Smokeless tobacco: Never Used     Comment: smokes marajuana at times  . Alcohol Use: Yes     Comment: ocassionally   OB History    No data available     Review of Systems  Constitutional: Negative for appetite change and fatigue.  HENT: Negative for congestion, ear discharge and sinus pressure.   Eyes: Negative for discharge.  Respiratory: Negative for cough.   Cardiovascular: Negative for chest pain.  Gastrointestinal: Positive for abdominal pain. Negative for diarrhea.  Genitourinary: Negative for frequency and hematuria.  Musculoskeletal: Negative for back pain.  Skin: Negative for rash.  Neurological: Negative for seizures and headaches.  Psychiatric/Behavioral: Negative for  hallucinations.      Allergies  Shellfish-derived products  Home Medications   Prior to Admission medications   Medication Sig Start Date End Date Taking? Authorizing Provider  albuterol (PROVENTIL HFA;VENTOLIN HFA) 108 (90 BASE) MCG/ACT inhaler Inhale 2 puffs into the lungs every 6 (six) hours as needed for wheezing or shortness of breath. 09/19/14  Yes Nani RavensAndrew M Wight, MD  albuterol (PROVENTIL) (2.5 MG/3ML) 0.083% nebulizer solution Take 3 mLs (2.5 mg total) by nebulization every 6 (six) hours as needed for wheezing or shortness of breath. 09/19/14  Yes Nani RavensAndrew M Wight, MD  EPINEPHrine (EPIPEN 2-PAK) 0.3 mg/0.3 mL IJ SOAJ injection Inject 0.3 mLs (0.3 mg total) into the muscle once. 09/30/14  Yes Nani RavensAndrew M Wight, MD  ibuprofen (ADVIL,MOTRIN) 800 MG tablet Take 1 tablet (800 mg total) by mouth every 8 (eight) hours as needed. 11/08/14   Benny LennertJoseph L Shandell Jallow, MD   BP 127/81 mmHg  Pulse 60  Temp(Src) 98 F (36.7 C) (Oral)  Resp 16  Ht 5\' 9"  (1.753 m)  Wt 124 lb (56.246 kg)  BMI 18.30 kg/m2  SpO2 100%  LMP 11/05/2014 Physical Exam  Constitutional: She is oriented to person, place, and time. She appears well-developed.  HENT:  Head: Normocephalic.  Eyes: Conjunctivae and EOM are normal. No scleral icterus.  Neck: Neck supple. No thyromegaly present.  Cardiovascular: Normal rate and regular rhythm.  Exam reveals no gallop and no friction rub.   No murmur heard. Pulmonary/Chest: No stridor. She has no  wheezes. She has no rales. She exhibits no tenderness.  Abdominal: She exhibits no distension. There is tenderness. There is no rebound.  Mild tender rlq  Musculoskeletal: Normal range of motion. She exhibits no edema.  Lymphadenopathy:    She has no cervical adenopathy.  Neurological: She is oriented to person, place, and time. She exhibits normal muscle tone. Coordination normal.  Skin: No rash noted. No erythema.  Psychiatric: She has a normal mood and affect. Her behavior is normal.     ED Course  Procedures (including critical care time) Labs Review Labs Reviewed  CBC WITH DIFFERENTIAL - Abnormal; Notable for the following:    RBC 5.12 (*)    All other components within normal limits  URINALYSIS, ROUTINE W REFLEX MICROSCOPIC - Abnormal; Notable for the following:    APPearance CLOUDY (*)    Protein, ur 30 (*)    All other components within normal limits  URINE MICROSCOPIC-ADD ON - Abnormal; Notable for the following:    Bacteria, UA MANY (*)    All other components within normal limits  COMPREHENSIVE METABOLIC PANEL  PREGNANCY, URINE    Imaging Review Ct Abdomen Pelvis W Contrast  11/08/2014   CLINICAL DATA:  Right lower quadrant pain for 5 days  EXAM: CT ABDOMEN AND PELVIS WITH CONTRAST  TECHNIQUE: Multidetector CT imaging of the abdomen and pelvis was performed using the standard protocol following bolus administration of intravenous contrast.  CONTRAST:  80mL OMNIPAQUE IOHEXOL 300 MG/ML  SOLN  COMPARISON:  01/09/2012  FINDINGS: Lung bases are free of acute infiltrate or sizable effusion.  The liver, spleen, adrenal glands and pancreas are within normal limits. The gallbladder is well distended and demonstrates some dependent density likely related to gallbladder sludge. No definitive stones are seen. The kidneys are well visualized bilaterally without renal calculi or obstructive changes.  The appendix is not well visualized although no right lower quadrant inflammatory changes to suggest appendicitis are noted. A 12 mm hypodensity is seen in the region of the right ovary likely representing a small cyst. Fluid is noted within the endometrial canal related to of the patient's current menstrual status. No significant free pelvic fluid is noted. No acute bony abnormality is seen.  IMPRESSION: Hypodensity in the region of the right adnexae likely representing a small right ovarian cyst.  The appendix is not well visualized although no inflammatory changes are seen.  Likely  gallbladder sludge.  No other focal abnormality is seen.   Electronically Signed   By: Alcide Clever M.D.   On: 11/08/2014 21:30     EKG Interpretation None      MDM   Final diagnoses:  Pain  Cyst of right ovary   Ovarian cyst,  Uti,   tx with keflex,  Motrin and zofran     Benny Lennert, MD 11/08/14 2159

## 2014-11-08 NOTE — Discharge Instructions (Signed)
[facility]

## 2014-11-08 NOTE — ED Notes (Signed)
Pt has had RLQ pain for the past 5 days and has not been able to keep any liquids down. Pt also reports some diarrhea with it today. Reports her stool has been black the past few days except for today.

## 2014-11-09 ENCOUNTER — Telehealth: Payer: Self-pay | Admitting: Family Medicine

## 2014-11-09 NOTE — Telephone Encounter (Signed)
error 

## 2014-11-10 ENCOUNTER — Encounter: Payer: Self-pay | Admitting: Family Medicine

## 2014-11-10 ENCOUNTER — Telehealth: Payer: Self-pay | Admitting: Family Medicine

## 2014-11-10 ENCOUNTER — Ambulatory Visit (INDEPENDENT_AMBULATORY_CARE_PROVIDER_SITE_OTHER): Payer: Medicaid Other | Admitting: Family Medicine

## 2014-11-10 VITALS — BP 112/72 | HR 69 | Temp 97.5°F | Ht 69.0 in | Wt 126.1 lb

## 2014-11-10 DIAGNOSIS — N83201 Unspecified ovarian cyst, right side: Secondary | ICD-10-CM | POA: Insufficient documentation

## 2014-11-10 DIAGNOSIS — N390 Urinary tract infection, site not specified: Secondary | ICD-10-CM | POA: Insufficient documentation

## 2014-11-10 DIAGNOSIS — N8320 Unspecified ovarian cysts: Secondary | ICD-10-CM

## 2014-11-10 LAB — URINE CULTURE: Colony Count: 5000

## 2014-11-10 NOTE — Patient Instructions (Signed)
It was nice to meet you today. For the pain from your ovarian cyst is important to take the ibuprofen regularly. Ovarian cysts will get better on their own.  As we discussed, if your pain greatly worsens and continues to escalate is important to seek medical care.  Finish Keflex as prescribed for urinary tract infection.  Ovarian Cyst An ovarian cyst is a fluid-filled sac that forms on an ovary. The ovaries are small organs that produce eggs in women. Various types of cysts can form on the ovaries. Most are not cancerous. Many do not cause problems, and they often go away on their own. Some may cause symptoms and require treatment. Common types of ovarian cysts include:  Functional cysts--These cysts may occur every month during the menstrual cycle. This is normal. The cysts usually go away with the next menstrual cycle if the woman does not get pregnant. Usually, there are no symptoms with a functional cyst.  Endometrioma cysts--These cysts form from the tissue that lines the uterus. They are also called "chocolate cysts" because they become filled with blood that turns brown. This type of cyst can cause pain in the lower abdomen during intercourse and with your menstrual period.  Cystadenoma cysts--This type develops from the cells on the outside of the ovary. These cysts can get very big and cause lower abdomen pain and pain with intercourse. This type of cyst can twist on itself, cut off its blood supply, and cause severe pain. It can also easily rupture and cause a lot of pain.  Dermoid cysts--This type of cyst is sometimes found in both ovaries. These cysts may contain different kinds of body tissue, such as skin, teeth, hair, or cartilage. They usually do not cause symptoms unless they get very big.  Theca lutein cysts--These cysts occur when too much of a certain hormone (human chorionic gonadotropin) is produced and overstimulates the ovaries to produce an egg. This is most common after  procedures used to assist with the conception of a baby (in vitro fertilization). CAUSES   Fertility drugs can cause a condition in which multiple large cysts are formed on the ovaries. This is called ovarian hyperstimulation syndrome.  A condition called polycystic ovary syndrome can cause hormonal imbalances that can lead to nonfunctional ovarian cysts. SIGNS AND SYMPTOMS  Many ovarian cysts do not cause symptoms. If symptoms are present, they may include:  Pelvic pain or pressure.  Pain in the lower abdomen.  Pain during sexual intercourse.  Increasing girth (swelling) of the abdomen.  Abnormal menstrual periods.  Increasing pain with menstrual periods.  Stopping having menstrual periods without being pregnant. DIAGNOSIS  These cysts are commonly found during a routine or annual pelvic exam. Tests may be ordered to find out more about the cyst. These tests may include:  Ultrasound.  X-ray of the pelvis.  CT scan.  MRI.  Blood tests. TREATMENT  Many ovarian cysts go away on their own without treatment. Your health care provider may want to check your cyst regularly for 2-3 months to see if it changes. For women in menopause, it is particularly important to monitor a cyst closely because of the higher rate of ovarian cancer in menopausal women. When treatment is needed, it may include any of the following:  A procedure to drain the cyst (aspiration). This may be done using a long needle and ultrasound. It can also be done through a laparoscopic procedure. This involves using a thin, lighted tube with a tiny camera on the  end (laparoscope) inserted through a small incision.  Surgery to remove the whole cyst. This may be done using laparoscopic surgery or an open surgery involving a larger incision in the lower abdomen.  Hormone treatment or birth control pills. These methods are sometimes used to help dissolve a cyst. HOME CARE INSTRUCTIONS   Only take over-the-counter or  prescription medicines as directed by your health care provider.  Follow up with your health care provider as directed.  Get regular pelvic exams and Pap tests. SEEK MEDICAL CARE IF:   Your periods are late, irregular, or painful, or they stop.  Your pelvic pain or abdominal pain does not go away.  Your abdomen becomes larger or swollen.  You have pressure on your bladder or trouble emptying your bladder completely.  You have pain during sexual intercourse.  You have feelings of fullness, pressure, or discomfort in your stomach.  You lose weight for no apparent reason.  You feel generally ill.  You become constipated.  You lose your appetite.  You develop acne.  You have an increase in body and facial hair.  You are gaining weight, without changing your exercise and eating habits.  You think you are pregnant. SEEK IMMEDIATE MEDICAL CARE IF:   You have increasing abdominal pain.  You feel sick to your stomach (nauseous), and you throw up (vomit).  You develop a fever that comes on suddenly.  You have abdominal pain during a bowel movement.  Your menstrual periods become heavier than usual. MAKE SURE YOU:  Understand these instructions.  Will watch your condition.  Will get help right away if you are not doing well or get worse. Document Released: 10/21/2005 Document Revised: 10/26/2013 Document Reviewed: 06/28/2013 [facility] Patient Information 2015 [place], [place]. This information is not intended to replace advice given to you by your health care provider. Make sure you discuss any questions you have with your health care provider.

## 2014-11-10 NOTE — Telephone Encounter (Signed)
Spoke with the patient.  She denies that she is upset with her care. She agrees to take ibuprofen for pain control.  She would like me to call her mother and talk to her about the care. She reports that I should likely call tomorrow evening though. Mother's phone number is [phone].  Will attempt to call patient's mother tomorrow evening.

## 2014-11-10 NOTE — Telephone Encounter (Signed)
Mother called upset about the care daughter received recently from provider.  Daughter came in to see someone regarding pain from cyst to ovary.  Was told to take ibuprofen for pain.  Mom upset because the medication isn't helping with pain.  Want to have someone call her to say what pain med can be given to daughter to remedy the problem

## 2014-11-10 NOTE — Progress Notes (Signed)
   Subjective:   Mia Johnson is a 19 y.o. female with a history of irregular menses here for ED follow-up.  Seen in ED on 11/08/14 for RLQ pain x1 wk. Found to have small (12mm) R ovarian cyst on CT and UTI (tx with keflex).  Reprots ongoing pain in RLQ that is intermittent.  Took Ibuprofen 800mg  once but not since.  N/V that was present prior to presentation to ED has resolved.  Denies dysuria, urinary frequency, urinary urgency.  Review of Systems:  Per HPI. All other systems reviewed and are negative.   PMH, PSH, Medications, Allergies, and FmHx reviewed and updated in EMR.  Social History: former smoker  Objective:  LMP 11/05/2014  Gen:  19 y.o. female in NAD Resp: Non-labored Abd: Soft, ND, BS present, no guarding or organomegaly, mildly TTP in RLQ Ext: WWP, no edema Neuro: Alert and oriented, speech normal   UCx: inadequate growth  Assessment:     Mia Johnson is a 19 y.o. female here for ED follow-up for right ovarian cyst and UTI.    Plan:     See problem list for problem-specific plans.   Shirlee LatchAngela Montrae Braithwaite, MD PGY-1,  [facility] 11/10/2014  1:46 PM

## 2014-11-10 NOTE — Assessment & Plan Note (Signed)
Urine culture with < 5000 colonies Patient reports she was symptomatic prior to ED visit Finish 5 day course of Keflex

## 2014-11-10 NOTE — Assessment & Plan Note (Signed)
Anticipatory guidance given Expects this will resolve on its own over time Advised patient to take ibuprofen regularly for better pain control No indication for narcotic pain medications [facility] Advised patient that if she is feeling better she may not need to keep this appointment

## 2014-11-12 NOTE — Telephone Encounter (Signed)
Called mother. No answer. Left VM stating that I spoke with patient and she was ok with plan of care.  Advised mom to call clinic with any further questions.  Shirlee LatchAngela Bacigalupo, MD, MPH PGY-1,  [facility] 11/12/2014 9:50 AM

## 2014-11-15 ENCOUNTER — Emergency Department (HOSPITAL_COMMUNITY): Payer: Medicaid Other

## 2014-11-15 ENCOUNTER — Emergency Department (HOSPITAL_COMMUNITY)
Admission: EM | Admit: 2014-11-15 | Discharge: 2014-11-15 | Disposition: A | Discharge status: Home / Self Care | Payer: Medicaid Other | Attending: Emergency Medicine | Admitting: Emergency Medicine

## 2014-11-15 ENCOUNTER — Encounter (HOSPITAL_COMMUNITY): Payer: Self-pay

## 2014-11-15 DIAGNOSIS — Z3202 Encounter for pregnancy test, result negative: Secondary | ICD-10-CM | POA: Insufficient documentation

## 2014-11-15 DIAGNOSIS — Z8742 Personal history of other diseases of the female genital tract: Secondary | ICD-10-CM | POA: Insufficient documentation

## 2014-11-15 DIAGNOSIS — Z87891 Personal history of nicotine dependence: Secondary | ICD-10-CM | POA: Diagnosis not present

## 2014-11-15 DIAGNOSIS — Z8659 Personal history of other mental and behavioral disorders: Secondary | ICD-10-CM | POA: Insufficient documentation

## 2014-11-15 DIAGNOSIS — Z79899 Other long term (current) drug therapy: Secondary | ICD-10-CM | POA: Diagnosis not present

## 2014-11-15 DIAGNOSIS — R1031 Right lower quadrant pain: Secondary | ICD-10-CM | POA: Insufficient documentation

## 2014-11-15 DIAGNOSIS — J4540 Moderate persistent asthma, uncomplicated: Secondary | ICD-10-CM | POA: Diagnosis not present

## 2014-11-15 DIAGNOSIS — Z8739 Personal history of other diseases of the musculoskeletal system and connective tissue: Secondary | ICD-10-CM | POA: Diagnosis not present

## 2014-11-15 HISTORY — DX: Unspecified ovarian cyst, unspecified side: N83.209

## 2014-11-15 LAB — WET PREP, GENITAL
Clue Cells Wet Prep HPF POC: NONE SEEN
Trich, Wet Prep: NONE SEEN
YEAST WET PREP: NONE SEEN

## 2014-11-15 LAB — URINALYSIS, ROUTINE W REFLEX MICROSCOPIC
Bilirubin Urine: NEGATIVE
Glucose, UA: NEGATIVE mg/dL
Hgb urine dipstick: NEGATIVE
Ketones, ur: NEGATIVE mg/dL
Leukocytes, UA: NEGATIVE
NITRITE: NEGATIVE
Protein, ur: NEGATIVE mg/dL
SPECIFIC GRAVITY, URINE: 1.014 (ref 1.005–1.030)
UROBILINOGEN UA: 0.2 mg/dL (ref 0.0–1.0)
pH: 7.5 (ref 5.0–8.0)

## 2014-11-15 LAB — PREGNANCY, URINE: Preg Test, Ur: NEGATIVE

## 2014-11-15 MED ORDER — NAPROXEN 250 MG PO TABS: 250.0000 mg | ORAL_TABLET | Freq: Two times a day (BID) | ORAL | Status: DC

## 2014-11-15 MED ORDER — KETOROLAC TROMETHAMINE 60 MG/2ML IM SOLN
30.0000 mg | Freq: Once | INTRAMUSCULAR | Status: AC
Start: 2014-11-15 — End: 2014-11-15
  Administered 2014-11-15: 30 mg via INTRAMUSCULAR
  Filled 2014-11-15: qty 2

## 2014-11-15 NOTE — ED Provider Notes (Signed)
CSN: [id number]     Arrival date & time 11/15/14  9562 History   First MD Initiated Contact with Patient 11/15/14 1000     Chief Complaint  Patient presents with  . Abdominal Pain   Mia Johnson is a 19 y.o. female who presents emergency department complaining of right lower quadrant abdominal pain for the past 2 and half weeks. The patient was seen in the emergency department on 11/08/2014 and diagnosed with a right ovarian cyst and a UTI. The patient was started on a five-day course of Keflex which is completed. Patient reports that she had some vaginal discharge until 2 days ago. Patient reports continued dysuria and urinary frequency since her last ER visit. The patient rates her pain at 10 out of 10 and worse with movement and walking. The patient's last Clovis Riley cycle was 10/30/2014. Patient reports she followed up with her primary care provider on 11/05/2014 for continued pain and was advised to continue with ibuprofen for pain. The patient reports she is sexually active with one female partner. Patient denies history of STDs. Patient denies fevers, chills, nausea, vomiting, diarrhea, urinary urgency, hematuria, hematochezia, numbness, tingling or weakness.  (Consider location/radiation/quality/duration/timing/severity/associated sxs/prior Treatment) HPI  Past Medical History  Diagnosis Date  . Asthma, moderate persistent   . Pes planus (flat feet)   . Environmental allergies   . Marijuana smoker   . Abdominal pain   . Depression   . PTSD (post-traumatic stress disorder)   . Ovarian cyst    Past Surgical History  Procedure Laterality Date  . Nose surgery     Family History  Problem Relation Age of Onset  . Obesity Mother    History  Substance Use Topics  . Smoking status: Former Smoker    Quit date: 03/07/2014  . Smokeless tobacco: Never Used     Comment: smokes marajuana at times  . Alcohol Use: Yes     Comment: ocassionally   OB History    No data available      Review of Systems  Constitutional: Negative for fever, chills and appetite change.  HENT: Negative for congestion and sore throat.   Eyes: Negative for visual disturbance.  Respiratory: Negative for cough, shortness of breath and wheezing.   Cardiovascular: Negative for chest pain and palpitations.  Gastrointestinal: Positive for abdominal pain. Negative for nausea, vomiting, diarrhea and blood in stool.  Genitourinary: Positive for dysuria and frequency. Negative for hematuria, flank pain, vaginal bleeding, difficulty urinating and genital sores.  Musculoskeletal: Negative for back pain and neck pain.  Skin: Negative for rash.  Neurological: Negative for weakness, light-headedness and headaches.      Allergies  Shellfish-derived products  Home Medications   Prior to Admission medications   Medication Sig Start Date End Date Taking? Authorizing Provider  albuterol (PROVENTIL HFA;VENTOLIN HFA) 108 (90 BASE) MCG/ACT inhaler Inhale 2 puffs into the lungs every 6 (six) hours as needed for wheezing or shortness of breath. 09/19/14   Nani Ravens, MD  albuterol (PROVENTIL) (2.5 MG/3ML) 0.083% nebulizer solution Take 3 mLs (2.5 mg total) by nebulization every 6 (six) hours as needed for wheezing or shortness of breath. 09/19/14   Nani Ravens, MD  cephALEXin (KEFLEX) 500 MG capsule Take 1 capsule (500 mg total) by mouth 4 (four) times daily. Patient not taking: Reported on 11/15/2014 11/08/14   Benny Lennert, MD  EPINEPHrine (EPIPEN 2-PAK) 0.3 mg/0.3 mL IJ SOAJ injection Inject 0.3 mLs (0.3 mg total) into the muscle once. 09/30/14  Nani Ravens, MD  naproxen (NAPROSYN) 250 MG tablet Take 1 tablet (250 mg total) by mouth 2 (two) times daily with a meal. 11/15/14   Einar Gip Alverta Caccamo, PA-C  ondansetron (ZOFRAN ODT) 4 MG disintegrating tablet  ODT q4 hours prn nausea/vomit Patient not taking: Reported on 11/15/2014 11/08/14   Benny Lennert, MD   BP 123/55 mmHg  Pulse 63   Temp(Src) 97.9 F (36.6 C) (Oral)  Resp 20  Ht  (1.753 m)  Wt 126 lb (57.153 kg)  BMI 18.60 kg/m2  SpO2 100%  LMP 11/05/2014 Physical Exam  Constitutional: She appears well-developed and well-nourished. No distress.  HENT:  Head: Normocephalic and atraumatic.  Right Ear: External ear normal.  Left Ear: External ear normal.  Mouth/Throat: Oropharynx is clear and moist. No oropharyngeal exudate.  Eyes: Conjunctivae are normal. Pupils are equal, round, and reactive to light. Right eye exhibits no discharge. Left eye exhibits no discharge.  Neck: Neck supple.  Cardiovascular: Normal rate, regular rhythm, normal heart sounds and intact distal pulses.  Exam reveals no gallop and no friction rub.   No murmur heard. Pulmonary/Chest: Effort normal and breath sounds normal. No respiratory distress. She has no wheezes. She has no rales.  Abdominal: Soft. Bowel sounds are normal. She exhibits no distension and no mass. There is tenderness. There is no rebound and no guarding. Hernia confirmed negative in the right inguinal area and confirmed negative in the left inguinal area.  Abdomen is soft. Bowel sounds are present. Patient has right lower quadrant abdominal tenderness to palpation. Negative Rovsing sign. Negative psoas and obturator sign  Genitourinary: There is no rash, tenderness, lesion or injury on the right labia. There is no rash, tenderness, lesion or injury on the left labia. Uterus is not deviated, not enlarged, not fixed and not tender. Cervix exhibits no motion tenderness and no friability. Right adnexum displays no mass, no tenderness and no fullness. Left adnexum displays no mass, no tenderness and no fullness. No erythema, tenderness or bleeding in the vagina. No foreign body around the vagina. No signs of injury around the vagina. No vaginal discharge found.  Pelvic exam performed by me with female RN chaperone. No external lesions or rashes noted. There is no vaginal bleeding  noted. Patient has a normal amount of white vaginal discharge. There is no adnexal tenderness or fullness noted. The patient's cervix is closed. There is no cervical motion tenderness. Patient's uterus is nontender and not enlarged.  Musculoskeletal: She exhibits no edema.  Lymphadenopathy:    She has no cervical adenopathy.       Right: No inguinal adenopathy present.       Left: No inguinal adenopathy present.  Neurological: She is alert. Coordination normal.  Skin: Skin is warm and dry. No rash noted. She is not diaphoretic. No erythema. No pallor.  Psychiatric: She has a normal mood and affect. Her behavior is normal.  Nursing note and vitals reviewed.   ED Course  Procedures (including critical care time) Labs Review Labs Reviewed  WET PREP, GENITAL - Abnormal; Notable for the following:    WBC, Wet Prep HPF POC FEW (*)    All other components within normal limits  GC/CHLAMYDIA PROBE AMP  URINALYSIS, ROUTINE W REFLEX MICROSCOPIC  PREGNANCY, URINE  HIV ANTIBODY (ROUTINE TESTING)  RPR    Imaging Review US Transvaginal Non-ob  11/15/2014   CLINICAL DATA:  Right lower quadrant abdominal pain x12 days  EXAM: TRANSABDOMINAL AND TRANSVAGINAL ULTRASOUND OF PELVIS  TECHNIQUE: Both transabdominal and transvaginal ultrasound examinations of the pelvis were performed. Transabdominal technique was performed for global imaging of the pelvis including uterus, ovaries, adnexal regions, and pelvic cul-de-sac. It was necessary to proceed with endovaginal exam following the transabdominal exam to visualize the endometrium.  COMPARISON:  None  FINDINGS: Uterus  Measurements: 6.3 x 2.8 x 3.6 cm. No fibroids or other mass visualized.  Endometrium  Thickness: 13 mm.  No focal abnormality visualized.  Right ovary  Measurements: 3.3 x 1.9 x 2.9 cm. 1.7 x 1.5 x 1.6 cm involuting corpus luteal cyst.  Left ovary  Measurements: 3.9 x 2.0 x 1.2 cm. Normal appearance/no adnexal mass.  Other findings  Small volume  pelvic ascites.  IMPRESSION: Negative pelvic ultrasound.   Electronically Signed   By: Charline BillsSriyesh  Krishnan M.D.   On: 11/15/2014 11:31   [place] Pelvis Complete  11/15/2014   CLINICAL DATA:  Right lower quadrant abdominal pain x12 days  EXAM: TRANSABDOMINAL AND TRANSVAGINAL ULTRASOUND OF PELVIS  TECHNIQUE: Both transabdominal and transvaginal ultrasound examinations of the pelvis were performed. Transabdominal technique was performed for global imaging of the pelvis including uterus, ovaries, adnexal regions, and pelvic cul-de-sac. It was necessary to proceed with endovaginal exam following the transabdominal exam to visualize the endometrium.  COMPARISON:  None  FINDINGS: Uterus  Measurements: 6.3 x 2.8 x 3.6 cm. No fibroids or other mass visualized.  Endometrium  Thickness: 13 mm.  No focal abnormality visualized.  Right ovary  Measurements: 3.3 x 1.9 x 2.9 cm. 1.7 x 1.5 x 1.6 cm involuting corpus luteal cyst.  Left ovary  Measurements: 3.9 x 2.0 x 1.2 cm. Normal appearance/no adnexal mass.  Other findings  Small volume pelvic ascites.  IMPRESSION: Negative pelvic ultrasound.   Electronically Signed   By: Charline BillsSriyesh  Krishnan M.D.   On: 11/15/2014 11:31     EKG Interpretation None      Filed Vitals:   11/15/14 1130 11/15/14 1215 11/15/14 1300 11/15/14 1314  BP: 120/57 124/60 123/53 123/55  Pulse:  64 71 63  Temp:      TempSrc:      Resp:    20  Height:      Weight:      SpO2:  100% 100% 100%    MDM   Meds given in ED:  Medications  ketorolac (TORADOL) injection 30 mg (30 mg Intramuscular Given 11/15/14 1129)    Discharge Medication List as of 11/15/2014  1:06 PM    START taking these medications   Details  naproxen (NAPROSYN) 250 MG tablet Take 1 tablet (250 mg total) by mouth 2 (two) times daily with a meal., Starting 11/15/2014, Until Discontinued, Print        Final diagnoses:  Right lower quadrant abdominal pain   Visit 19 year old female who presents [facility] complaining  of intermittent right lower quadrant abdominal pain for the past 2 weeks. Patient was seen in the emergency department on 11/08/2014 and diagnosed with a right ovarian cyst. Patient is also diagnosed the UTI and completed a 5 day course of Keflex. The patient reports continued pain, worse while walking. Patient has been taking ibuprofen with moderate relief. Patient had vaginal discharge until 2 days ago. The patient is afebrile and nontoxic-appearing. The patient's urinalysis is unremarkable. The patient has a negative urine pregnancy test. Patient's wet prep is unremarkable. The patient's pelvic exam is unremarkable. The patient's pelvic ultrasound is also unremarkable. Patient is likely having continued pain from her ovarian cyst. Reevaluation  the patient reports her pain is almost completely resolved with Toradol. Will discharge patient Naprosyn for pain control. Patient has a follow-up appointment with her OB/GYN on 11/23/2014. Advised her to keep this appointment. Strict return precautions are provided. I advised patient to return to the emergency department with new or worsening symptoms or new concerns. Patient verbalized understanding and agreement with plan.  This patient was discussed with and evaluated by Dr. Adriana Simas who agrees with assessment and plan.     Lawana Chambers, PA-C 11/15/14 1815  Donnetta Hutching, MD 11/19/14 [phone]

## 2014-11-15 NOTE — ED Notes (Signed)
Pt here for ovarian cyst last week and returns for the same pain to the right lower abd. Went to the follow up and wasn't given any other medication. No nausea or vomiting.

## 2014-11-15 NOTE — Discharge Instructions (Signed)
Abdominal Pain, Women  Abdominal (stomach, pelvic, or belly) pain can be caused by many things. It is important to tell your doctor:   The location of the pain.   Does it come and go or is it present all the time?   Are there things that start the pain (eating certain foods, exercise)?   Are there other symptoms associated with the pain (fever, nausea, vomiting, diarrhea)?  All of this is helpful to know when trying to find the cause of the pain.  CAUSES    Stomach: virus or bacteria infection, or ulcer.   Intestine: appendicitis (inflamed appendix), regional ileitis (Crohn's disease), ulcerative colitis (inflamed colon), irritable bowel syndrome, diverticulitis (inflamed diverticulum of the colon), or cancer of the stomach or intestine.   Gallbladder disease or stones in the gallbladder.   Kidney disease, kidney stones, or infection.   Pancreas infection or cancer.   Fibromyalgia (pain disorder).   Diseases of the female organs:   Uterus: fibroid (non-cancerous) tumors or infection.   Fallopian tubes: infection or tubal pregnancy.   Ovary: cysts or tumors.   Pelvic adhesions (scar tissue).   Endometriosis (uterus lining tissue growing in the pelvis and on the pelvic organs).   Pelvic congestion syndrome (female organs filling up with blood just before the menstrual period).   Pain with the menstrual period.   Pain with ovulation (producing an egg).   Pain with an IUD (intrauterine device, birth control) in the uterus.   Cancer of the female organs.   Functional pain (pain not caused by a disease, may improve without treatment).   Psychological pain.   Depression.  DIAGNOSIS   Your doctor will decide the seriousness of your pain by doing an examination.   Blood tests.   X-rays.   Ultrasound.   CT scan (computed tomography, special type of X-ray).   MRI (magnetic resonance imaging).   Cultures, for infection.   Barium enema (dye inserted in the large intestine, to better view it with  X-rays).   Colonoscopy (looking in intestine with a lighted tube).   Laparoscopy (minor surgery, looking in abdomen with a lighted tube).   Major abdominal exploratory surgery (looking in abdomen with a large incision).  TREATMENT   The treatment will depend on the cause of the pain.    Many cases can be observed and treated at home.   Over-the-counter medicines recommended by your caregiver.   Prescription medicine.   Antibiotics, for infection.   Birth control pills, for painful periods or for ovulation pain.   Hormone treatment, for endometriosis.   Nerve blocking injections.   Physical therapy.   Antidepressants.   Counseling with a psychologist or psychiatrist.   Minor or major surgery.  HOME CARE INSTRUCTIONS    Do not take laxatives, unless directed by your caregiver.   Take over-the-counter pain medicine only if ordered by your caregiver. Do not take aspirin because it can cause an upset stomach or bleeding.   Try a clear liquid diet (broth or water) as ordered by your caregiver. Slowly move to a bland diet, as tolerated, if the pain is related to the stomach or intestine.   Have a thermometer and take your temperature several times a day, and record it.   Bed rest and sleep, if it helps the pain.   Avoid sexual intercourse, if it causes pain.   Avoid stressful situations.   Keep your follow-up appointments and tests, as your caregiver orders.   If the pain does   not go away with medicine or surgery, you may try:   Acupuncture.   Relaxation exercises (yoga, meditation).   Group therapy.   Counseling.  SEEK MEDICAL CARE IF:    You notice certain foods cause stomach pain.   Your home care treatment is not helping your pain.   You need stronger pain medicine.   You want your IUD removed.   You feel faint or lightheaded.   You develop nausea and vomiting.   You develop a rash.   You are having side effects or an allergy to your medicine.  SEEK IMMEDIATE MEDICAL CARE IF:    Your  pain does not go away or gets worse.   You have a fever.   Your pain is felt only in portions of the abdomen. The right side could possibly be appendicitis. The left lower portion of the abdomen could be colitis or diverticulitis.   You are passing blood in your stools (bright red or black tarry stools, with or without vomiting).   You have blood in your urine.   You develop chills, with or without a fever.   You pass out.  MAKE SURE YOU:    Understand these instructions.   Will watch your condition.   Will get help right away if you are not doing well or get worse.  Document Released: 08/18/2007 Document Revised: 03/07/2014 Document Reviewed: 09/07/2009  ExitCare Patient Information 2015 [employer]. This information is not intended to replace advice given to you by your health care provider. Make sure you discuss any questions you have with your health care provider.          Ovarian Cyst  An ovarian cyst is a fluid-filled sac that forms on an ovary. The ovaries are small organs that produce eggs in women. Various types of cysts can form on the ovaries. Most are not cancerous. Many do not cause problems, and they often go away on their own. Some may cause symptoms and require treatment. Common types of ovarian cysts include:   Functional cysts--These cysts may occur every month during the menstrual cycle. This is normal. The cysts usually go away with the next menstrual cycle if the woman does not get pregnant. Usually, there are no symptoms with a functional cyst.   Endometrioma cysts--These cysts form from the tissue that lines the uterus. They are also called "chocolate cysts" because they become filled with blood that turns brown. This type of cyst can cause pain in the lower abdomen during intercourse and with your menstrual period.   Cystadenoma cysts--This type develops from the cells on the outside of the ovary. These cysts can get very big and cause lower abdomen pain and pain with  intercourse. This type of cyst can twist on itself, cut off its blood supply, and cause severe pain. It can also easily rupture and cause a lot of pain.   Dermoid cysts--This type of cyst is sometimes found in both ovaries. These cysts may contain different kinds of body tissue, such as skin, teeth, hair, or cartilage. They usually do not cause symptoms unless they get very big.   Theca lutein cysts--These cysts occur when too much of a certain hormone (human chorionic gonadotropin) is produced and overstimulates the ovaries to produce an egg. This is most common after procedures used to assist with the conception of a baby (in vitro fertilization).  CAUSES    Fertility drugs can cause a condition in which multiple large cysts are formed on the   ovaries. This is called ovarian hyperstimulation syndrome.   A condition called polycystic ovary syndrome can cause hormonal imbalances that can lead to nonfunctional ovarian cysts.  SIGNS AND SYMPTOMS   Many ovarian cysts do not cause symptoms. If symptoms are present, they may include:   Pelvic pain or pressure.   Pain in the lower abdomen.   Pain during sexual intercourse.   Increasing girth (swelling) of the abdomen.   Abnormal menstrual periods.   Increasing pain with menstrual periods.   Stopping having menstrual periods without being pregnant.  DIAGNOSIS   These cysts are commonly found during a routine or annual pelvic exam. Tests may be ordered to find out more about the cyst. These tests may include:   Ultrasound.   X-ray of the pelvis.   CT scan.   MRI.   Blood tests.  TREATMENT   Many ovarian cysts go away on their own without treatment. Your health care provider may want to check your cyst regularly for 2-3 months to see if it changes. For women in menopause, it is particularly important to monitor a cyst closely because of the higher rate of ovarian cancer in menopausal women. When treatment is needed, it may include any of the following:   A  procedure to drain the cyst (aspiration). This may be done using a long needle and ultrasound. It can also be done through a laparoscopic procedure. This involves using a thin, lighted tube with a tiny camera on the end (laparoscope) inserted through a small incision.   Surgery to remove the whole cyst. This may be done using laparoscopic surgery or an open surgery involving a larger incision in the lower abdomen.   Hormone treatment or birth control pills. These methods are sometimes used to help dissolve a cyst.  HOME CARE INSTRUCTIONS    Only take over-the-counter or prescription medicines as directed by your health care provider.   Follow up with your health care provider as directed.   Get regular pelvic exams and Pap tests.  SEEK MEDICAL CARE IF:    Your periods are late, irregular, or painful, or they stop.   Your pelvic pain or abdominal pain does not go away.   Your abdomen becomes larger or swollen.   You have pressure on your bladder or trouble emptying your bladder completely.   You have pain during sexual intercourse.   You have feelings of fullness, pressure, or discomfort in your stomach.   You lose weight for no apparent reason.   You feel generally ill.   You become constipated.   You lose your appetite.   You develop acne.   You have an increase in body and facial hair.   You are gaining weight, without changing your exercise and eating habits.   You think you are pregnant.  SEEK IMMEDIATE MEDICAL CARE IF:    You have increasing abdominal pain.   You feel sick to your stomach (nauseous), and you throw up (vomit).   You develop a fever that comes on suddenly.   You have abdominal pain during a bowel movement.   Your menstrual periods become heavier than usual.  MAKE SURE YOU:   Understand these instructions.   Will watch your condition.   Will get help right away if you are not doing well or get worse.  Document Released: 10/21/2005 Document Revised: 10/26/2013 Document  Reviewed: 06/28/2013  [employer] Patient Information 2015 [employer]. This information is not intended to replace advice given to you by   your health care provider. Make sure you discuss any questions you have with your health care provider.

## 2014-11-15 NOTE — ED Notes (Signed)
NAD at this time. Pt is stable and taking a cab home.

## 2014-11-16 LAB — HIV ANTIBODY (ROUTINE TESTING W REFLEX)
HIV 1/HIV 2 AB: NONREACTIVE
HIV 1/O/2 Abs-Index Value: <1 (ref <1.00)

## 2014-11-16 LAB — GC/CHLAMYDIA PROBE AMP
CT PROBE, AMP APTIMA: NEGATIVE
GC PROBE AMP APTIMA: NEGATIVE

## 2014-11-16 LAB — RPR: RPR Ser Ql: NONREACTIVE

## 2014-11-23 ENCOUNTER — Encounter: Payer: Self-pay | Admitting: Obstetrics & Gynecology

## 2014-11-23 ENCOUNTER — Ambulatory Visit (INDEPENDENT_AMBULATORY_CARE_PROVIDER_SITE_OTHER): Payer: Medicaid Other | Admitting: Obstetrics & Gynecology

## 2014-11-23 VITALS — BP 134/59 | HR 75 | Temp 97.9°F | Ht 69.0 in | Wt 129.5 lb

## 2014-11-23 DIAGNOSIS — N83201 Unspecified ovarian cyst, right side: Secondary | ICD-10-CM

## 2014-11-23 DIAGNOSIS — N8320 Unspecified ovarian cysts: Secondary | ICD-10-CM

## 2014-11-23 NOTE — Progress Notes (Signed)
Patient ID: Mia Johnson, female   DOB: [birth year], 19 y.o.   MRN: [id number]  Chief Complaint  Patient presents with  . Follow-up    ED F/u from 11/15/13 for right ovarian cyst    HPI Mia Johnson is a 19 y.o. female.  No obstetric history on file. G0. Sharp RLQ pain since 11/04/14 when running. CT showed 1.9 cm ov cyst which resolved on [place] 11/15/14. Pain worse with exertion and certain positions help. H/o MS pain in past  HPI  Past Medical History  Diagnosis Date  . Asthma, moderate persistent   . Pes planus (flat feet)   . Environmental allergies   . Marijuana smoker   . Abdominal pain   . Depression   . PTSD (post-traumatic stress disorder)   . Ovarian cyst     Past Surgical History  Procedure Laterality Date  . Nose surgery      Family History  Problem Relation Age of Onset  . Obesity Mother     Social History History  Substance Use Topics  . Smoking status: Former Smoker    Quit date: 03/07/2014  . Smokeless tobacco: Never Used     Comment: smokes marajuana at times  . Alcohol Use: Yes     Comment: ocassionally    Allergies  Allergen Reactions  . Shellfish-Derived Products Nausea Only and Other (See Comments)    Positive allergy test    Current Outpatient Prescriptions  Medication Sig Dispense Refill  . albuterol (PROVENTIL HFA;VENTOLIN HFA) 108 (90 BASE) MCG/ACT inhaler Inhale 2 puffs into the lungs every 6 (six) hours as needed for wheezing or shortness of breath. 1 Inhaler 0  . albuterol (PROVENTIL) (2.5 MG/3ML) 0.083% nebulizer solution Take 3 mLs (2.5 mg total) by nebulization every 6 (six) hours as needed for wheezing or shortness of breath. 75 mL 0  . EPINEPHrine (EPIPEN 2-PAK) 0.3 mg/0.3 mL IJ SOAJ injection Inject 0.3 mLs (0.3 mg total) into the muscle once. 1 Device 1  . naproxen (NAPROSYN) 250 MG tablet Take 1 tablet (250 mg total) by mouth 2 (two) times daily with a meal. (Patient not taking: Reported on 11/23/2014) 30 tablet 0  .  ondansetron (ZOFRAN ODT) 4 MG disintegrating tablet  ODT q4 hours prn nausea/vomit (Patient not taking: Reported on 11/15/2014) 12 tablet 0   No current facility-administered medications for this visit.    Review of Systems Review of Systems  Constitutional: Negative.   HENT: Negative.   Genitourinary: Positive for pelvic pain. Negative for menstrual problem.    Blood pressure 134/59, pulse 75, temperature 97.9 F (36.6 C), temperature source Oral, height  (1.753 m), weight 129 lb 8 oz (58.741 kg), last menstrual period 11/05/2014.  Physical Exam Physical Exam  Constitutional: She appears well-developed. No distress.  Pulmonary/Chest: Effort normal. No respiratory distress.  Abdominal: Soft. She exhibits no mass. There is no tenderness.  Genitourinary: No vaginal discharge found.  No mass or tenderness  Skin: Skin is warm and dry.  Psychiatric: She has a normal mood and affect. Her behavior is normal.    Data Reviewed  CLINICAL DATA: Right lower quadrant abdominal pain x12 days  EXAM: TRANSABDOMINAL AND TRANSVAGINAL ULTRASOUND OF PELVIS  TECHNIQUE: Both transabdominal and transvaginal ultrasound examinations of the pelvis were performed. Transabdominal technique was performed for global imaging of the pelvis including uterus, ovaries, adnexal regions, and pelvic cul-de-sac. It was necessary to proceed with endovaginal exam following the transabdominal exam to visualize the endometrium.  COMPARISON: None  FINDINGS:  Uterus  Measurements: 6.3 x 2.8 x 3.6 cm. No fibroids or other mass visualized.  Endometrium  Thickness: 13 mm. No focal abnormality visualized.  Right ovary  Measurements: 3.3 x 1.9 x 2.9 cm. 1.7 x 1.5 x 1.6 cm involuting corpus luteal cyst.  Left ovary  Measurements: 3.9 x 2.0 x 1.2 cm. Normal appearance/no adnexal mass.  Other findings  Small volume pelvic ascites.  IMPRESSION: Negative pelvic  ultrasound.   Electronically Signed  By: Charline BillsSriyesh Krishnan M.D.  On: 11/15/2014 11:31  Assessment    No ovarian cyst, possilbe MS abd pain     Plan    Expectant management for now, ibuprofen prn, report if sx not improving        ARNOLD,[name] 11/23/2014, 1:55 PM

## 2014-11-23 NOTE — Patient Instructions (Signed)
Pelvic Pain Pelvic pain is pain felt below the belly button and between your hips. It can be caused by many different things. It is important to get help right away. This is especially true for severe, sharp, or unusual pain that comes on suddenly.  HOME CARE  Only take medicine as told by your doctor.  Rest as told by your doctor.  Eat a healthy diet, such as fruits, vegetables, and lean meats.  Drink enough fluids to keep your pee (urine) clear or pale yellow, or as told.  Avoid sex (intercourse) if it causes pain.  Apply warm or cold packs to your lower belly (abdomen). Use the type of pack that helps the pain.  Avoid situations that cause you stress.  Keep a journal to track your pain. Write down:  When the pain started.  Where it is located.  If there are things that seem to be related to the pain, such as food or your period.  Follow up with your doctor as told. GET HELP RIGHT AWAY IF:   You have heavy bleeding from the vagina.  You have more pelvic pain.  You feel lightheaded or pass out (faint).  You have chills.  You have pain when you pee or have blood in your pee.  You cannot stop having watery poop (diarrhea).  You cannot stop throwing up (vomiting).  You have a fever or lasting symptoms for more than 3 days.  You have a fever and your symptoms suddenly get worse.  You are being physically or sexually abused.  Your medicine does not help your pain.  You have fluid (discharge) coming from your vagina that is not normal. MAKE SURE YOU:  Understand these instructions.  Will watch your condition.  Will get help if you are not doing well or get worse. Document Released: 04/08/2008 Document Revised: 04/21/2012 Document Reviewed: 02/10/2012 ExitCare Patient Information 2015 [employer]. This information is not intended to replace advice given to you by your health care provider. Make sure you discuss any questions you have with your health care  provider.  

## 2014-12-05 ENCOUNTER — Encounter: Payer: Self-pay | Admitting: Internal Medicine

## 2014-12-08 ENCOUNTER — Encounter: Payer: Self-pay | Admitting: Internal Medicine

## 2014-12-08 ENCOUNTER — Ambulatory Visit (INDEPENDENT_AMBULATORY_CARE_PROVIDER_SITE_OTHER): Payer: Medicaid Other | Admitting: Internal Medicine

## 2014-12-08 VITALS — BP 137/81 | HR 69 | Ht 69.0 in | Wt 126.6 lb

## 2014-12-08 DIAGNOSIS — G43A0 Cyclical vomiting, not intractable: Secondary | ICD-10-CM

## 2014-12-08 DIAGNOSIS — R1115 Cyclical vomiting syndrome unrelated to migraine: Secondary | ICD-10-CM

## 2014-12-08 DIAGNOSIS — R1011 Right upper quadrant pain: Secondary | ICD-10-CM

## 2014-12-08 DIAGNOSIS — G43109 Migraine with aura, not intractable, without status migrainosus: Secondary | ICD-10-CM | POA: Diagnosis not present

## 2014-12-08 DIAGNOSIS — G8929 Other chronic pain: Secondary | ICD-10-CM

## 2014-12-08 DIAGNOSIS — R1010 Upper abdominal pain, unspecified: Secondary | ICD-10-CM | POA: Diagnosis not present

## 2014-12-08 MED ORDER — ONDANSETRON HCL 8 MG PO TABS: ORAL_TABLET | ORAL | Status: DC

## 2014-12-08 MED ORDER — DICYCLOMINE HCL 20 MG PO TABS: ORAL_TABLET | ORAL | Status: DC

## 2014-12-08 MED ORDER — RIZATRIPTAN BENZOATE 10 MG PO TBDP: 10.0000 mg | ORAL_TABLET | ORAL | Status: DC | PRN

## 2014-12-08 MED ORDER — AMITRIPTYLINE HCL 25 MG PO TABS: 25.0000 mg | ORAL_TABLET | Freq: Every day | ORAL | Status: DC

## 2014-12-08 NOTE — Progress Notes (Signed)
Subjective:    Patient ID: Mia Johnson, female    DOB: February 12, 1996, 19 y.o.   MRN: [id number]  HPI she returns to adolescent clinic for the first time since 2013 Her primary complaints today are ongoing right upper quadrant abdominal pain along with frequent early morning nausea and vomiting that sometimes persists throughout the day. She can have the nausea and vomiting without the pain and the pain without any nausea. She describes a poor appetite recently with some worsening of her abdominal pain after she eats but no clear association with postprandial vomiting. She does not describe reflux. She occasionally wakes at night with abdominal pain but not with any esophageal distress or reflux. Her pain in the abdomen is made worse by running or jumping. This has resulted in her being unable to keep her commitment to the basketball team at school. Because many of these episodes occur in the morning it also has caused her to miss multiple days of school since the fall of 2015. Part of this is when she waits to get through throwing the buses artery left and she has no other mode of transportation for school. Review our past notes from 2013 about her problems with cyclic vomiting and missing school. Somehow she is managed to accrue enough credits in the intervening time that she is on track to graduate from [employer] this spring. She was enrolled at [employer] to get a high school equivalency but was asked to come back to [employer] to graduate because  overall GPA is 3.6. Now she is being threatened with losing credits in the classes she is currently passing because of missed school days. She has early morning nausea and vomiting at least 3 days a week. She does have occasional days where she is asymptomatic and feels good enough to do everything.  Recent evaluations of this right-sided abdominal pain in the emergency room and with GYN have ruled out pelvic causes. Transvaginal ultrasound was negative. CT of the abdomen  and pelvis was essentially negative except for noticing gallbladder sludge. Chest x-ray was consistent with her history of asthma. CBC and metabolic profiles have remained normal Although the symptoms have been present for several years her weight has remained stable. Her limitations on activity have been musculoskeletal and not from easy fatigability. Her stools vary from diarrhea to constipation without clear association with any foods other than lactose intolerance or with any particular anxiety which is frequently present in her life.  Patient Active Problem List--- this is what has accumulated in her electronic record    Diagnosis Date Noted  . Right ovarian cyst-----this was minimal at most  11/10/2014  . UTI (urinary tract infection)--- subsequent UAs clear  11/10/2014  . Thumb pain 10/12/2014  . Chest pain 10/12/2014  . Dysuria 08/12/2014  . Rotator cuff tendonitis 08/16/2013  . ETOH abuse----she no longer drinks  04/07/2013  . PTSD (post-traumatic stress disorder)----- this is a significant reason for her intermittent anxiety  09/17/2012  . Insomnia----related to psychosocial situation, living situation, occasional abdominal pain etc.  07/17/2012  . Adolescent problems----not many people could withstand her upbringing without serious developmental problems during adolescence  03/19/2012  . Nonallopathic lesion of lumbosacral region 11/15/2011  . Adolescent depression 09/18/2011  . Genu valgus, congenital 08/28/2011  . Cyclic vomiting syndrome 12/26/2010  . MIGRAINE HEADACHE--- this history is very significant given her current complaints of abdominal pain of unknown etiology --- she has a long history of migraine headaches she has an are which  is vague-a sense that something bad is about to happen -followed by left frontotemporal headache associated with photophobia and nausea but no vomiting, requiring sleep or lying down in a dark room to resolve. She's never had an evaluation that I  can find in the electronic record for her migraine headaches . 10/09/2010  . PES PLANUS 04/25/2010  . IRREGULAR MENSES 03/06/2010  . UNEQUAL LEG LENGTH 07/13/2009  . FOOT DEFORMITY, CONGENITAL 05/11/2007  . RHINITIS, ALLERGIC 01/01/2007   Past Surgical History  Procedure Laterality Date  . Nose surgery       Social history-this is significant because of her alienation from her family and her mother in particular. She lives with her current girlfriend who has a job to support them. This is not the same girlfriend she had during her clinic visits in 2013. She sees a future with college at [employer] and then working. She would love the opportunity to play competitive basketball again. She is significantly behind on her adolescent psychosocial milestones.  In past year she was a marijuana user but upon using with a substance that was laced with something else causing her to develop hallucinations she can no longer tolerate marijuana and so as not use this drug at least 8 months. She died in use other illegal substances or alcohol (except on occasion).  Review of Systems  Constitutional: Negative for fever, diaphoresis, fatigue and unexpected weight change.  HENT: Negative for trouble swallowing.   Eyes: Negative for visual disturbance.  Respiratory:       She has not required asthma medication in several months  Cardiovascular: Negative for chest pain, palpitations and leg swelling.  Gastrointestinal: Negative for blood in stool, anal bleeding and rectal pain.  Genitourinary: Negative for difficulty urinating and menstrual problem.       Recent urinary symptoms have cleared after treatment  Neurological: Negative for tremors, speech difficulty and weakness.       Dizziness is not associated with her abdominal syndrome  Hematological: Negative for adenopathy. Does not bruise/bleed easily.  Psychiatric/Behavioral: Negative for suicidal ideas, self-injury and decreased concentration.        She has a terrible sleep cycle with frequent difficulty falling asleep and frequent wakening with an irregular bedtime often altered by social events or interrupted by pain in her abdomen. There are times when she is hypersomnolent but this is not predictable. There is no snoring or no observed apnea.       Objective:   Physical Exam BP 137/81 mmHg  Pulse 69  Ht  (1.753 m)  Wt 126 lb 9.6 oz (57.425 kg)  BMI 18.69 kg/m2  LMP 11/05/2014 Somewhat thin but athletic in appearance Alert and in no acute distress PERRLA with conjugate EOMs Remainder of ENT clear including no thyromegaly or lymphadenopathy Heart regular without murmur//lungs clear including forced expiration The abdomen is soft and not distended. She has mild tenderness in the right upper quadrant and the right middle quadrant and periumbilical area but no rebound guarding or percussion tenderness. The lower anterior and  axillary rib margins are not tender to palpation Lower extremities are clear with full peripheral pulses and no edema Cranial nerves II through XII are intact Deep tendon reflexes are symmetrical There are no distal sensory or motor losses Cerebellar is intact Her mood is good today and she engages in this evaluation in search of an etiology of her problem Her affect is appropriately concerned Her thought content is normal Her judgment is affected by  her upbringing with parenting that did not give her coping skills and a current living situation this far from stable and a current academic situation that holds no clarity for the future She is not suicidal, not currently depressed, has an appreciably good attitude about being able to finish school and acknowledges anxiety in certain situations but is deciding to overcome this by being strong-willed.      Assessment & Plan:  Non-intractable cyclical vomiting with nausea  Migraine with aura and without status migrainosus, not  intractable  Abdominal pain, chronic, right upper quadrant  She easily meets criteria for cyclic vomiting syndrome Her neurologic exam is normal even though she has a strong history of migraines Migraine headaches are associated with cyclic vomiting syndrome Her cyclic vomiting cannot be explained by cannabis use All pathophysiologic abnormalities seem to have been ruled out with current studies with the exception of a nonfunctional gallbladder, some sort of dysfunctional gastric syndrome like a funny gastroparesis or esophageal stricture. In her psychosocial environment irritable bowel syndrome can be a diagnosis only of exclusion  Meds ordered this encounter  Medications  . amitriptyline (ELAVIL) 25 MG tablet will be started to try to afford a regular sleep cycle, as a prevention medicine for migraine headaches, and as an off label medicines for cyclic vomiting     Sig: Take 1-2 tablets (25-50 mg total) by mouth at bedtime.    Dispense:  60 tablet    Refill:  0  . rizatriptan (MAXALT-MLT) 10 MG disintegrating tablet--to be taken during her aura or within 15 minutes of the onset of her headache . This may help establish a migraine that is sensitive to treatment .    Sig: Take 1 tablet (10 mg total) by mouth as needed for migraine. May repeat in 2 hours if needed. No more than 4 pills in one week    Dispense:  8 tablet    Refill:  0  . dicyclomine (BENTYL) 20 MG tablet--- this may provide short-term relief     Sig: One at bedtime, one in am and 1 before dinner    Dispense:  40 tablet    Refill:  0  . ondansetron (ZOFRAN) 8 MG tablet--- this may also provide short-term relief and perhaps better school attendance     Sig: One each morning    Dispense:  20 tablet    Refill:  0    HIDA scan will be needed to rule out nonfunctional gallbladder. If this is normal she needs endoscopy and perhaps gastric emptying study. I have written a letter to school saying that I would be willing to vouch  for all of her absences based on her diagnosis. Her possibility for successful future will be greatly enhanced with a high school diploma She will follow-up in 2-3 weeks for reevaluation  This consultation was a 50 minute office visit

## 2014-12-12 ENCOUNTER — Encounter: Payer: Self-pay | Admitting: *Deleted

## 2014-12-15 ENCOUNTER — Encounter (HOSPITAL_COMMUNITY): Payer: Self-pay | Admitting: Emergency Medicine

## 2014-12-15 ENCOUNTER — Emergency Department (HOSPITAL_COMMUNITY)
Admission: EM | Admit: 2014-12-15 | Discharge: 2014-12-15 | Discharge status: Against Medical Advice | Payer: Medicaid Other | Attending: Emergency Medicine | Admitting: Emergency Medicine

## 2014-12-15 DIAGNOSIS — R109 Unspecified abdominal pain: Secondary | ICD-10-CM | POA: Diagnosis not present

## 2014-12-15 DIAGNOSIS — J45909 Unspecified asthma, uncomplicated: Secondary | ICD-10-CM | POA: Diagnosis not present

## 2014-12-15 DIAGNOSIS — R1110 Vomiting, unspecified: Secondary | ICD-10-CM | POA: Insufficient documentation

## 2014-12-15 LAB — COMPREHENSIVE METABOLIC PANEL
ALT: 15 U/L (ref 0–35)
AST: 19 U/L (ref 0–37)
Albumin: 3.9 g/dL (ref 3.5–5.2)
Alkaline Phosphatase: 76 U/L (ref 39–117)
Anion gap: 9 (ref 5–15)
BILIRUBIN TOTAL: 0.8 mg/dL (ref 0.3–1.2)
BUN: 10 mg/dL (ref 6–23)
CO2: 26 mmol/L (ref 19–32)
CREATININE: 0.83 mg/dL (ref 0.50–1.10)
Calcium: 9 mg/dL (ref 8.4–10.5)
Chloride: 104 mmol/L (ref 96–112)
GFR calc Af Amer: >90 mL/min (ref >90)
GFR calc non Af Amer: >90 mL/min (ref >90)
GLUCOSE: 97 mg/dL (ref 70–99)
Potassium: 3.9 mmol/L (ref 3.5–5.1)
SODIUM: 139 mmol/L (ref 135–145)
Total Protein: 6.8 g/dL (ref 6.0–8.3)

## 2014-12-15 LAB — CBC WITH DIFFERENTIAL/PLATELET
BASOS ABS: 0 10*3/uL (ref 0.0–0.1)
Basophils Relative: 0 % (ref 0–1)
EOS ABS: 0.1 10*3/uL (ref 0.0–0.7)
EOS PCT: 2 % (ref 0–5)
HEMATOCRIT: 40.7 % (ref 36.0–46.0)
Hemoglobin: 14 g/dL (ref 12.0–15.0)
Lymphocytes Relative: 33 % (ref 12–46)
Lymphs Abs: 1.1 10*3/uL (ref 0.7–4.0)
MCH: 28.3 pg (ref 26.0–34.0)
MCHC: 34.4 g/dL (ref 30.0–36.0)
MCV: 82.2 fL (ref 78.0–100.0)
MONO ABS: 0.3 10*3/uL (ref 0.1–1.0)
Monocytes Relative: 11 % (ref 3–12)
Neutro Abs: 1.8 10*3/uL (ref 1.7–7.7)
Neutrophils Relative %: 54 % (ref 43–77)
Platelets: 217 10*3/uL (ref 150–400)
RBC: 4.95 MIL/uL (ref 3.87–5.11)
RDW: 12.6 % (ref 11.5–15.5)
WBC: 3.2 10*3/uL — ABNORMAL LOW (ref 4.0–10.5)

## 2014-12-15 LAB — POC URINE PREG, ED: Preg Test, Ur: NEGATIVE

## 2014-12-15 NOTE — ED Notes (Signed)
Patient didn't want to wait.   Patient left without getting room after triage.

## 2014-12-15 NOTE — ED Notes (Signed)
Pt c/o abd pain and vomiting x 2 days; pt with hx of same an cyclic vomiting issues in past

## 2014-12-22 ENCOUNTER — Encounter: Payer: Self-pay | Admitting: Internal Medicine

## 2014-12-22 ENCOUNTER — Ambulatory Visit (INDEPENDENT_AMBULATORY_CARE_PROVIDER_SITE_OTHER): Payer: Medicaid Other | Admitting: Internal Medicine

## 2014-12-22 VITALS — BP 118/69 | Ht 69.0 in | Wt 124.0 lb

## 2014-12-22 DIAGNOSIS — R1115 Cyclical vomiting syndrome unrelated to migraine: Secondary | ICD-10-CM

## 2014-12-22 DIAGNOSIS — G43A0 Cyclical vomiting, not intractable: Secondary | ICD-10-CM

## 2014-12-22 DIAGNOSIS — R109 Unspecified abdominal pain: Secondary | ICD-10-CM | POA: Insufficient documentation

## 2014-12-22 DIAGNOSIS — G43C0 Periodic headache syndromes in child or adult, not intractable: Secondary | ICD-10-CM

## 2014-12-22 DIAGNOSIS — R079 Chest pain, unspecified: Secondary | ICD-10-CM

## 2014-12-22 MED ORDER — CILIDINIUM-CHLORDIAZEPOXIDE 2.5-5 MG PO CAPS: 1.0000 | ORAL_CAPSULE | Freq: Every day | ORAL | Status: DC

## 2014-12-22 NOTE — Progress Notes (Signed)
Follow-up in adolescent medicine clinic  Periodic headache syndrome, not intractable--migraine with occasional aura She continues with several headaches a week that have variable intensity She did not take Maxalt in a way that would let us predict whether it was helpful She only took amitriptyline 1 night.  Non-intractable cyclical vomiting with nausea with Recurrent abdominal pain This is mainly in the right middle quadrant and periumbilic area. This continues to occur at different times throughout the day without definite provocation. On the first night she took amitriptyline it helped her sleep very well but she awoke with early morning nausea and abdominal pain and when she to look her morning medications, Bentyl and Zofran and went to school, she felt very dizzy and then she threw up and had to leave school. As her discomfort lasted into the afternoon she tried to go to the emergency room but waited 3 hours and left after they had done blood work before she had been seen. CBC and metabolic profile were normal.If she misses school bus because of her early morning nausea she has no way to get school. She still does not give a consistent history of reflux, and has not responded to PPIs in the past.  Chest pain, unspecified chest pain type This is a separate problem from her stomach pain and rarely do these things occur at the same time This tends to occur after she runs, tries to be active, or has to pick up something heavy. It is located in the right axillary area along the lower and middle ribs and sometimes into the posterior thoracic area on the lower right.  Exam BP 118/69 mmHg  Ht 5\' 9"  (1.753 m)  Wt 124 lb (56.246 kg)  BMI 18.30 kg/m2 She is asymptomatic today HEENT clear Abdomen benign Chest range of motion good without pain Her mood is good though anxious to find a solution for her problems  Impression Problem #1 her cyclic vomiting syndrome with recurrent abdominal pain is now  been present more than 3 years. She has had every test except for HIDA scan which is scheduled for tomorrow, and endoscopy- which may be our next referral. She can discontinue Bentyl as it made her too dizzy. She will start a trial of Librax at bedtime to see if it prevents morning problems. She can use Zofran for nausea.  Problem #2 migraine headaches She will retry amitriptyline at bedtime both for sleep and for headache prevention She will try Maxalt at the onset of her headache  Problem #3 chest wall pain We will address her other problems first  Meds ordered this encounter  Medications  . clidinium-chlordiazePOXIDE (LIBRAX) 5-2.5 MG per capsule    Sig: Take 1 capsule by mouth at bedtime.    Dispense:  30 capsule    Refill:  0   Follow-up 3 weeks

## 2014-12-28 ENCOUNTER — Ambulatory Visit (HOSPITAL_COMMUNITY)
Admission: RE | Admit: 2014-12-28 | Discharge: 2014-12-28 | Disposition: A | Discharge status: Home / Self Care | Payer: Medicaid Other | Source: Ambulatory Visit | Attending: Internal Medicine | Admitting: Internal Medicine

## 2014-12-28 DIAGNOSIS — R1115 Cyclical vomiting syndrome unrelated to migraine: Secondary | ICD-10-CM

## 2014-12-28 DIAGNOSIS — G43A0 Cyclical vomiting, not intractable: Secondary | ICD-10-CM | POA: Insufficient documentation

## 2014-12-28 DIAGNOSIS — R1011 Right upper quadrant pain: Secondary | ICD-10-CM

## 2014-12-28 DIAGNOSIS — R1010 Upper abdominal pain, unspecified: Secondary | ICD-10-CM | POA: Diagnosis present

## 2014-12-28 DIAGNOSIS — G8929 Other chronic pain: Secondary | ICD-10-CM | POA: Diagnosis not present

## 2014-12-28 MED ORDER — TECHNETIUM TC 99M MEBROFENIN IV KIT
5.3000 | PACK | Freq: Once | INTRAVENOUS | Status: AC | PRN
  Administered 2014-12-28: 5 via INTRAVENOUS

## 2014-12-28 MED ORDER — TECHNETIUM TC 99M MEBROFENIN IV KIT: 5.3000 | PACK | Freq: Once | INTRAVENOUS | Status: AC | PRN

## 2014-12-29 ENCOUNTER — Other Ambulatory Visit: Payer: Self-pay | Admitting: *Deleted

## 2014-12-29 ENCOUNTER — Telehealth: Payer: Self-pay | Admitting: *Deleted

## 2014-12-29 DIAGNOSIS — R1115 Cyclical vomiting syndrome unrelated to migraine: Secondary | ICD-10-CM

## 2014-12-29 NOTE — Telephone Encounter (Signed)
-----   Message from Tonye Pearsonobert P Doolittle, MD sent at 12/28/2014  6:17 PM EST ----- Let her know the gallbladder is completely normal. The next step is to schedule evaluation with gastroenterology to consider endoscopy.--Refer her to anyone at [facility]

## 2014-12-29 NOTE — Telephone Encounter (Signed)
Referral is in for [facility].  Primary care needs to call for this referral appt since the patient has [place] [facility].  Patient is aware of gallbladder study results and will wait to hear about appt at [facility]

## 2015-01-10 ENCOUNTER — Other Ambulatory Visit: Payer: Self-pay | Admitting: *Deleted

## 2015-01-12 ENCOUNTER — Ambulatory Visit (INDEPENDENT_AMBULATORY_CARE_PROVIDER_SITE_OTHER): Payer: Medicaid Other | Admitting: Internal Medicine

## 2015-01-12 ENCOUNTER — Encounter: Payer: Self-pay | Admitting: Internal Medicine

## 2015-01-12 VITALS — BP 122/64 | Ht 69.0 in | Wt 124.0 lb

## 2015-01-12 DIAGNOSIS — R109 Unspecified abdominal pain: Secondary | ICD-10-CM

## 2015-01-12 DIAGNOSIS — F32A Depression, unspecified: Secondary | ICD-10-CM

## 2015-01-12 DIAGNOSIS — K5900 Constipation, unspecified: Secondary | ICD-10-CM

## 2015-01-12 DIAGNOSIS — F4310 Post-traumatic stress disorder, unspecified: Secondary | ICD-10-CM

## 2015-01-12 DIAGNOSIS — G43A0 Cyclical vomiting, not intractable: Secondary | ICD-10-CM

## 2015-01-12 DIAGNOSIS — R1115 Cyclical vomiting syndrome unrelated to migraine: Secondary | ICD-10-CM

## 2015-01-12 DIAGNOSIS — F329 Major depressive disorder, single episode, unspecified: Secondary | ICD-10-CM

## 2015-01-12 MED ORDER — POLYETHYLENE GLYCOL 3350 17 GM/SCOOP PO POWD: ORAL | Status: DC

## 2015-01-12 NOTE — Addendum Note (Signed)
Addended by: Tonye PearsonOLITTLE, Jeffrie Stander P on: 01/12/2015 10:19 PM   Modules accepted: Level of Service

## 2015-01-12 NOTE — Progress Notes (Addendum)
Subjective:    Mia Johnson is a 19 y.o. old female here with for chronic recurrent abdominal pain and constipation.    HPI  Describes return of constipation after recent HIDA scan. She described initially having diarrhea after the scan which has now transitioned to constipation. Has peri-umbilical abdominal pain that is relieved by passing gas. Pain present when pushing. Has urge to stool but nothing coming out. Tried an OTC laxative clean-out. Threw up afterwards. Does feel like food gets stuck in chest if she doesn't take a drink. Trouble is with solid foods and not soft foods.  Describes chronic right sided pain abdominal pain with walking, located in right flank and groin. Rates pain as 8/10. Last time she was sick was two days ago. Started throwing up two days ago while sitting outside drinking water. Threw up food contents in the grass, no food contents. Did not have nausea. Does not drink a lot of water. Drinks one soda daily and drinks gatorade.  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 Lost her job during illness. She has missed several days of school due to her abdominal pain but says she still is doing very well from a grade standpoint. There may be some question about attendance that requires medical excuses which she will let us know about.  Sleep is okay Anxiety and depression issues aren't active at this point  She describes a plan for the future after high school of attending [employer] this summer for an architecture type course--- still hoping to play basketball 1 day.  Review of Systems  History and Problem List: Jalani has Migraine; RHINITIS, ALLERGIC; IRREGULAR MENSES; PES PLANUS; UNEQUAL LEG LENGTH; FOOT DEFORMITY, CONGENITAL; Cyclic vomiting syndrome; Genu valgus, congenital; Adolescent depression; Nonallopathic lesion of lumbosacral region; Adolescent problems; Insomnia; PTSD (post-traumatic stress disorder); ETOH abuse; Rotator cuff  tendonitis; Dysuria; Thumb pain; Chest pain; Right ovarian cyst; UTI (urinary tract infection); and Recurrent abdominal pain on her problem list.  Karrina  has a past medical history of Asthma, moderate persistent; Pes planus (flat feet); Environmental allergies; Marijuana smoker; Abdominal pain; Depression; PTSD (post-traumatic stress disorder); and Ovarian cyst.     Objective:    BP 122/64 mmHg  Ht  (1.753 m)  Wt 124 lb (56.246 kg)  BMI 18.30 kg/m2  LMP 12/28/2014 Physical Exam  Constitutional: She is oriented to person, place, and time. She appears well-developed and well-nourished. No distress.  Eyes: Conjunctivae are normal. Pupils are equal, round, and reactive to light.  Cardiovascular: Regular rhythm and normal heart sounds.  Bradycardia present.   No murmur heard. Pulmonary/Chest: Effort normal and breath sounds normal. No respiratory distress.  Abdominal: Soft. She exhibits no abdominal bruit. Bowel sounds are decreased. There is tenderness in the right upper quadrant, right lower quadrant and epigastric area. There is no guarding, no tenderness at McBurney's point and negative Murphy's sign.  Palpable stool in left quadrants.  Neurological: She is alert and oriented to person, place, and time.  Skin: Skin is warm and dry. No rash noted.       Assessment and Plan:     Kirstine was seen today for chronic abdominal pain and recurrent vomiting. She has had a return of constipation after her HIDA scan. Will treat with daily miralax in Gatorade (4 scoops in 32 ounces) because she did not tolerate her constipation clean out. She has not filled her new medication which is not very important. It is important that she is able to  be evaluated by GI for chronic abdominal pain and the sensation of food sticking in her throat and her vomiting without nausea. Notably her HIDA scan was normal, effectively ruling out a gallbladder problem and the etiology of this issue. - Miralax 4 caps  daily, titrate to effect (reviewed titration principles with patient) - ensure GI referral gets through - she may return as necessary but does not have a scheduled follow-up at this time  -She still needs a GI consult to see if endoscopy is indicated because of the chronicity of her problems - Follow-up 1-2 months  Theresia LoPitts, Lady GaryBrian Hardy, MD Peds [facility]     I have participated in the care of this patient with the pediatric resident and agree with Diagnosis and Plan as documented. Robert P. Merla Richesoolittle, M.D.

## 2015-01-24 NOTE — Progress Notes (Signed)
I have gotten [facility] a [facility] app[facility], 01/[facility]/15.  I spoke with [name] and she is aware.

## 2015-01-26 ENCOUNTER — Emergency Department (HOSPITAL_COMMUNITY)
Admission: EM | Admit: 2015-01-26 | Discharge: 2015-01-26 | Disposition: A | Discharge status: Home / Self Care | Payer: Medicaid Other | Attending: Emergency Medicine | Admitting: Emergency Medicine

## 2015-01-26 ENCOUNTER — Encounter (HOSPITAL_COMMUNITY): Payer: Self-pay | Admitting: Emergency Medicine

## 2015-01-26 ENCOUNTER — Emergency Department (HOSPITAL_COMMUNITY): Payer: Medicaid Other

## 2015-01-26 DIAGNOSIS — J4540 Moderate persistent asthma, uncomplicated: Secondary | ICD-10-CM | POA: Insufficient documentation

## 2015-01-26 DIAGNOSIS — Z8742 Personal history of other diseases of the female genital tract: Secondary | ICD-10-CM | POA: Insufficient documentation

## 2015-01-26 DIAGNOSIS — Y9367 Activity, basketball: Secondary | ICD-10-CM | POA: Insufficient documentation

## 2015-01-26 DIAGNOSIS — Y92310 Basketball court as the place of occurrence of the external cause: Secondary | ICD-10-CM | POA: Insufficient documentation

## 2015-01-26 DIAGNOSIS — S93601A Unspecified sprain of right foot, initial encounter: Secondary | ICD-10-CM

## 2015-01-26 DIAGNOSIS — Z87891 Personal history of nicotine dependence: Secondary | ICD-10-CM | POA: Insufficient documentation

## 2015-01-26 DIAGNOSIS — X58XXXA Exposure to other specified factors, initial encounter: Secondary | ICD-10-CM | POA: Insufficient documentation

## 2015-01-26 DIAGNOSIS — Z8739 Personal history of other diseases of the musculoskeletal system and connective tissue: Secondary | ICD-10-CM | POA: Insufficient documentation

## 2015-01-26 DIAGNOSIS — Z8659 Personal history of other mental and behavioral disorders: Secondary | ICD-10-CM | POA: Diagnosis not present

## 2015-01-26 DIAGNOSIS — S99921A Unspecified injury of right foot, initial encounter: Secondary | ICD-10-CM | POA: Diagnosis present

## 2015-01-26 DIAGNOSIS — Y998 Other external cause status: Secondary | ICD-10-CM | POA: Insufficient documentation

## 2015-01-26 DIAGNOSIS — Z79899 Other long term (current) drug therapy: Secondary | ICD-10-CM | POA: Insufficient documentation

## 2015-01-26 MED ORDER — IBUPROFEN 400 MG PO TABS
400.0000 mg | ORAL_TABLET | Freq: Once | ORAL | Status: AC
  Administered 2015-01-26: 400 mg via ORAL
  Filled 2015-01-26: qty 1

## 2015-01-26 MED ORDER — IBUPROFEN 400 MG PO TABS: 400.0000 mg | ORAL_TABLET | Freq: Four times a day (QID) | ORAL | Status: DC | PRN

## 2015-01-26 NOTE — ED Provider Notes (Signed)
CSN: [id number]     Arrival date & time 01/26/15  2016 History  This chart was scribed for non-physician practitioner, Fayrene Helper, PA-C working with Lorre Nick, MD by Angelene Giovanni, ED Scribe. The patient was seen in room TR05C/TR05C and the patient's care was started at 10:04 PM    Chief Complaint  Patient presents with  . Foot Injury   The history is provided by the patient. No language interpreter was used.   HPI Comments: Mia Johnson is a 19 y.o. female with a hx of pes planus who presents to the Emergency Department complaining of a right foot injury while playing basketball about an hour and a half ago where she twisted her ankle inward, falling to the ground. She reports associated right foot pain that radiates up her right leg and gait problems. Pain is moderate in intensity.  She denies that she took any medication PTA. She denies any other pain or injury.denies any knee or ankle pain.  No numbness or weakness.  Past Medical History  Diagnosis Date  . Asthma, moderate persistent   . Pes planus (flat feet)   . Environmental allergies   . Marijuana smoker   . Abdominal pain   . Depression   . PTSD (post-traumatic stress disorder)   . Ovarian cyst    Past Surgical History  Procedure Laterality Date  . Nose surgery     Family History  Problem Relation Age of Onset  . Obesity Mother    History  Substance Use Topics  . Smoking status: Former Smoker    Quit date: 03/07/2014  . Smokeless tobacco: Never Used     Comment: smokes marajuana at times  . Alcohol Use: Yes     Comment: ocassionally   OB History    No data available     Review of Systems  Constitutional: Negative for fever.  Musculoskeletal: Positive for arthralgias (right foot) and gait problem.      Allergies  Shellfish-derived products  Home Medications   Prior to Admission medications   Medication Sig Start Date End Date Taking? Authorizing Provider  albuterol (PROVENTIL  HFA;VENTOLIN HFA) 108 (90 BASE) MCG/ACT inhaler Inhale 2 puffs into the lungs every 6 (six) hours as needed for wheezing or shortness of breath. 09/19/14  Yes Nani Ravens, MD  clidinium-chlordiazePOXIDE (LIBRAX) 5-2.5 MG per capsule Take 1 capsule by mouth at bedtime. 12/22/14  Yes [name], MD  EPINEPHrine (EPIPEN 2-PAK) 0.3 mg/0.3 mL IJ SOAJ injection Inject 0.3 mLs (0.3 mg total) into the muscle once. 09/30/14  Yes Nani Ravens, MD  rizatriptan (MAXALT-MLT) 10 MG disintegrating tablet Take 1 tablet (10 mg total) by mouth as needed for migraine. May repeat in 2 hours if needed. No more than 4 pills in one week 12/08/14  Yes [name], MD  ondansetron [facility]) 8 MG tablet One each morning Patient not taking: Reported on 01/26/2015 12/08/14   [name], MD  polyethylene glycol powder (GLYCOLAX/MIRALAX) powder Mix 4 capfuls or 4 packets in each 32 ounce gatorade bottle Patient not taking: Reported on 01/26/2015 01/12/15   Vanessa Ralphs, MD   BP 133/62 mmHg  Pulse 79  Temp(Src) 98.1 F (36.7 C) (Oral)  Resp 18  Ht  (1.753 m)  Wt 124 lb (56.246 kg)  BMI 18.30 kg/m2  SpO2 100%  LMP 01/24/2015 Physical Exam  Constitutional: She is oriented to person, place, and time. She appears well-developed and well-nourished. No distress.  HENT:  Head: Normocephalic and atraumatic.  Eyes: Conjunctivae and EOM are normal.  Neck: Neck supple. No tracheal deviation present.  Cardiovascular: Normal rate.   Pulmonary/Chest: Effort normal. No respiratory distress.  Musculoskeletal: Normal range of motion.  R foot: Point tenderness to medial aspect of the right foot, inferior to medial malleolus region. Brisk cap refills to all toes.  Normal dorsiflexion and plantarflexion. Pain with foot inversion  R ankle and R knee non TTP  Neurological: She is alert and oriented to person, place, and time.  Skin: Skin is warm and dry.  Psychiatric: She has a normal mood and affect. Her  behavior is normal.  Nursing note and vitals reviewed.   ED Course  Procedures (including critical care time) DIAGNOSTIC STUDIES: Oxygen Saturation is 100% on RA, normal by my interpretation.    COORDINATION OF CARE: 10:07 PM- Pt with right foot pain. Pt was made aware of her negative X-ray.  Pt advised of plan for treatment and pt agrees.   Suspect R foot sprain from mechanical injury.  RICE therapy discussed.     Labs Review Labs Reviewed - No data to display  Imaging Review Dg Ankle Complete Right  01/26/2015   CLINICAL DATA:  Rolled right ankle while playing basketball. Felt pop. Initial encounter.  EXAM: RIGHT ANKLE - COMPLETE 3+ VIEW  COMPARISON:  Right ankle radiographs performed 05/11/2007  FINDINGS: There is no evidence of fracture or dislocation. The ankle mortise is intact; the interosseous space is within normal limits. No talar tilt or subluxation is seen.  The joint spaces are preserved. No significant soft tissue abnormalities are seen.  IMPRESSION: No evidence of fracture or dislocation.   Electronically Signed   By: Roanna RaiderJeffery  Chang M.D.   On: 01/26/2015 21:48     EKG Interpretation None      MDM   Final diagnoses:  Right foot sprain, initial encounter   BP 133/62 mmHg  Pulse 79  Temp(Src) 98.1 F (36.7 C) (Oral)  Resp 18  Ht 5\' 9"  (1.753 m)  Wt 124 lb (56.246 kg)  BMI 18.30 kg/m2  SpO2 100%  LMP 01/24/2015  I have reviewed nursing notes and vital signs. I personally reviewed the imaging tests through PACS system  I reviewed available ER/hospitalization records thought the EMR  I personally performed the services described in this documentation, which was scribed in my presence. The recorded information has been reviewed and is accurate.     Fayrene HelperBowie Alysandra Lobue, PA-C 01/26/15 2230  Lorre NickAnthony Allen, MD 01/29/15 [phone]

## 2015-01-26 NOTE — Discharge Instructions (Signed)
Foot Sprain The muscles and cord like structures which attach muscle to bone (tendons) that surround the feet are made up of units. A foot sprain can occur at the weakest spot in any of these units. This condition is most often caused by injury to or overuse of the foot, as from playing contact sports, or aggravating a previous injury, or from poor conditioning, or obesity. SYMPTOMS  Pain with movement of the foot.  Tenderness and swelling at the injury site.  Loss of strength is present in moderate or severe sprains. THE THREE GRADES OR SEVERITY OF FOOT SPRAIN ARE:  Mild (Grade I): Slightly pulled muscle without tearing of muscle or tendon fibers or loss of strength.  Moderate (Grade II): Tearing of fibers in a muscle, tendon, or at the attachment to bone, with small decrease in strength.  Severe (Grade III): Rupture of the muscle-tendon-bone attachment, with separation of fibers. Severe sprain requires surgical repair. Often repeating (chronic) sprains are caused by overuse. Sudden (acute) sprains are caused by direct injury or over-use. DIAGNOSIS  Diagnosis of this condition is usually by your own observation. If problems continue, a caregiver may be required for further evaluation and treatment. X-rays may be required to make sure there are not breaks in the bones (fractures) present. Continued problems may require physical therapy for treatment. PREVENTION  Use strength and conditioning exercises appropriate for your sport.  Warm up properly prior to working out.  Use athletic shoes that are made for the sport you are participating in.  Allow adequate time for healing. Early return to activities makes repeat injury more likely, and can lead to an unstable arthritic foot that can result in prolonged disability. Mild sprains generally heal in 3 to 10 days, with moderate and severe sprains taking 2 to 10 weeks. Your caregiver can help you determine the proper time required for  healing. HOME CARE INSTRUCTIONS   Apply ice to the injury for 15-20 minutes, 03-04 times per day. Put the ice in a plastic bag and place a towel between the bag of ice and your skin.  An elastic wrap (like an Ace bandage) may be used to keep swelling down.  Keep foot above the level of the heart, or at least raised on a footstool, when swelling and pain are present.  Try to avoid use other than gentle range of motion while the foot is painful. Do not resume use until instructed by your caregiver. Then begin use gradually, not increasing use to the point of pain. If pain does develop, decrease use and continue the above measures, gradually increasing activities that do not cause discomfort, until you gradually achieve normal use.  Use crutches if and as instructed, and for the length of time instructed.  Keep injured foot and ankle wrapped between treatments.  Massage foot and ankle for comfort and to keep swelling down. Massage from the toes up towards the knee.  Only take over-the-counter or prescription medicines for pain, discomfort, or fever as directed by your caregiver. SEEK IMMEDIATE MEDICAL CARE IF:   Your pain and swelling increase, or pain is not controlled with medications.  You have loss of feeling in your foot or your foot turns cold or blue.  You develop new, unexplained symptoms, or an increase of the symptoms that brought you to your caregiver. MAKE SURE YOU:   Understand these instructions.  Will watch your condition.  Will get help right away if you are not doing well or get worse. Document Released:   04/12/2002 Document Revised: 01/13/2012 Document Reviewed: 06/09/2008 ExitCare Patient Information 2015 [employer]. This information is not intended to replace advice given to you by your health care provider. Make sure you discuss any questions you have with your health care provider.  

## 2015-01-26 NOTE — ED Notes (Signed)
Rolled right ankle laterally this afternoon-- having pain in ankle-- no swelling, pedal pulse present

## 2015-01-31 ENCOUNTER — Other Ambulatory Visit: Payer: Self-pay | Admitting: Physician Assistant

## 2015-01-31 DIAGNOSIS — R1011 Right upper quadrant pain: Secondary | ICD-10-CM

## 2015-02-01 ENCOUNTER — Other Ambulatory Visit: Payer: Self-pay | Admitting: Gastroenterology

## 2015-02-06 ENCOUNTER — Ambulatory Visit
Admission: RE | Admit: 2015-02-06 | Discharge: 2015-02-06 | Disposition: A | Discharge status: Home / Self Care | Payer: Medicaid Other | Source: Ambulatory Visit | Attending: Physician Assistant | Admitting: Physician Assistant

## 2015-02-06 DIAGNOSIS — R1011 Right upper quadrant pain: Secondary | ICD-10-CM

## 2015-02-16 ENCOUNTER — Ambulatory Visit (INDEPENDENT_AMBULATORY_CARE_PROVIDER_SITE_OTHER): Payer: Medicaid Other | Admitting: Internal Medicine

## 2015-02-16 DIAGNOSIS — R1115 Cyclical vomiting syndrome unrelated to migraine: Secondary | ICD-10-CM

## 2015-02-16 DIAGNOSIS — G43A0 Cyclical vomiting, not intractable: Secondary | ICD-10-CM

## 2015-02-16 DIAGNOSIS — R079 Chest pain, unspecified: Secondary | ICD-10-CM

## 2015-02-16 DIAGNOSIS — G43C0 Periodic headache syndromes in child or adult, not intractable: Secondary | ICD-10-CM | POA: Diagnosis not present

## 2015-02-16 DIAGNOSIS — F4310 Post-traumatic stress disorder, unspecified: Secondary | ICD-10-CM

## 2015-02-16 MED ORDER — TRIAZOLAM 0.25 MG PO TABS: 0.2500 mg | ORAL_TABLET | Freq: Every evening | ORAL | Status: DC | PRN

## 2015-02-16 MED ORDER — SUCRALFATE 1 GM/10ML PO SUSP: 1.0000 g | Freq: Three times a day (TID) | ORAL | Status: DC

## 2015-02-16 NOTE — Progress Notes (Signed)
Subjective:    Patient ID: Mia SnideSamantha Johnson, female    DOB: 10/31/1996, 19 y.o.   MRN: [id number]  HPI  19 year old returns to [facility] for follow-up of chronic problems Has completed GI workup for cyclic vomiting and endoscopy revealed only gastritis She continues to have problems with nausea and vomiting sporadically throughout the day prompted by eating but also prompted when empty. Stress is certainly a factor. She has been unable to go to school for the past week because she is angry with her teachers who have been penaltizing her for days lost and changing her As [facility].  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 Chest pain 10/12/2014    Priority: Medium  . PTSD (post-traumatic stress disorder) 09/17/2012    Priority: Medium  . Cyclic vomiting syndrome 12/26/2010    endocopy [facility]  . Migraine 10/09/2010    Priority: Medium  . Recurrent abdominal pain 12/22/2014  . Right ovarian cyst 11/10/2014  . UTI (urinary tract infection) 11/10/2014  . Thumb pain 10/12/2014  . Dysuria 08/12/2014  . Rotator cuff tendonitis 08/16/2013  . ETOH abuse 04/07/2013  . Insomnia 07/17/2012  . Adolescent problems 03/19/2012  . Nonallopathic lesion of lumbosacral region 11/15/2011  . Adolescent depression 09/18/2011  . Genu valgus, congenital 08/28/2011  . PES PLANUS 04/25/2010  . IRREGULAR MENSES 03/06/2010  . UNEQUAL LEG LENGTH 07/13/2009  . FOOT  DEFORMITY, CONGENITAL 05/11/2007  . RHINITIS, ALLERGIC 01/01/2007    Not as constipated as before due to MiraLAX //no reflux //no nocturnal pain    Review of Systems  sleep cycle totally disrupts by lack of schedule  Denies depression but plenty of anxiety  No self injury  Past history of chest and musculoskeletal issues not prominent at this point  ROS otherwise noncontributory    Objective:   Physical Exam LMP 01/24/2015  HEENT clear Heart regular Abdomen benign issue usual Mood is appropriately upset with an appropriate active affect Thought content is normal/judgment is only age of being disrupted by her lifelong traumas       Wt Readings from Last 3 Encounters:  01/26/15 124 lb (56.246 kg) (43 %*, Z = -0.18)  01/12/15 124 lb (56.246 kg) (43 %*, Z = -0.18)  12/22/14 124 lb (56.246 kg) (43 %*, Z = -0.17)   * Growth percentiles are based on CDC 2-20 Years data.    Assessment & Plan:  PTSD (post-traumatic stress disorder)  Periodic headache syndrome, not intractable--more stable recently without medication  Non-intractable cyclical vomiting with nausea--made worse by her generalized anxiety//mild gastritis by endoscopy  Letter written to school asking for homebound instruction to complete the rest of semester Meds ordered this encounter  Medications  . sucralfate (CARAFATE) 1 GM/10ML suspension    Sig: Take 10 mLs (1 g total) by mouth 4 (four) times daily -  with meals and at bedtime.    Dispense:  420 mL  Refill:  3  . triazolam (HALCION) 0.25 MG tablet--- to try and reestablish her sleep cycle     Sig: Take 1 tablet (0.25 mg total) by mouth at bedtime as needed for sleep.    Dispense:  30 tablet    Refill:  0   Follow-up 2-3 weeks

## 2015-02-16 NOTE — Patient Instructions (Signed)
Find your librax and use it before you eat to see if you don't get as sick Take new med carafate Use new med halcion at bedtime

## 2015-03-02 ENCOUNTER — Ambulatory Visit (INDEPENDENT_AMBULATORY_CARE_PROVIDER_SITE_OTHER): Payer: Medicaid Other | Admitting: Internal Medicine

## 2015-03-02 DIAGNOSIS — G43A0 Cyclical vomiting, not intractable: Secondary | ICD-10-CM

## 2015-03-02 MED ORDER — QUETIAPINE FUMARATE 25 MG PO TABS: 25.0000 mg | ORAL_TABLET | Freq: Every day | ORAL | Status: DC

## 2015-03-02 MED ORDER — TRIAZOLAM 0.25 MG PO TABS: 0.2500 mg | ORAL_TABLET | Freq: Every evening | ORAL | Status: DC | PRN

## 2015-03-02 NOTE — Progress Notes (Signed)
Dropped off school form re HBI carafate plus this decision=no further vomiting!!!! She anticipates graduation!!! insur wouldn't approve triazolam so will try 25 seroquel to reset sleep cycle

## 2015-06-19 ENCOUNTER — Encounter (HOSPITAL_COMMUNITY): Payer: Self-pay | Admitting: Family Medicine

## 2015-06-19 ENCOUNTER — Emergency Department (HOSPITAL_COMMUNITY)
Admission: EM | Admit: 2015-06-19 | Discharge: 2015-06-19 | Disposition: A | Discharge status: Home / Self Care | Payer: Medicaid Other | Attending: Emergency Medicine | Admitting: Emergency Medicine

## 2015-06-19 DIAGNOSIS — Z87891 Personal history of nicotine dependence: Secondary | ICD-10-CM | POA: Diagnosis not present

## 2015-06-19 DIAGNOSIS — F329 Major depressive disorder, single episode, unspecified: Secondary | ICD-10-CM | POA: Diagnosis not present

## 2015-06-19 DIAGNOSIS — G4489 Other headache syndrome: Secondary | ICD-10-CM | POA: Diagnosis not present

## 2015-06-19 DIAGNOSIS — J4540 Moderate persistent asthma, uncomplicated: Secondary | ICD-10-CM | POA: Insufficient documentation

## 2015-06-19 DIAGNOSIS — Q6650 Congenital pes planus, unspecified foot: Secondary | ICD-10-CM | POA: Diagnosis not present

## 2015-06-19 DIAGNOSIS — J029 Acute pharyngitis, unspecified: Secondary | ICD-10-CM | POA: Diagnosis not present

## 2015-06-19 DIAGNOSIS — F4310 Post-traumatic stress disorder, unspecified: Secondary | ICD-10-CM | POA: Diagnosis not present

## 2015-06-19 DIAGNOSIS — R112 Nausea with vomiting, unspecified: Secondary | ICD-10-CM

## 2015-06-19 DIAGNOSIS — Z8742 Personal history of other diseases of the female genital tract: Secondary | ICD-10-CM | POA: Insufficient documentation

## 2015-06-19 DIAGNOSIS — Z79899 Other long term (current) drug therapy: Secondary | ICD-10-CM | POA: Diagnosis not present

## 2015-06-19 DIAGNOSIS — R51 Headache: Secondary | ICD-10-CM | POA: Diagnosis present

## 2015-06-19 LAB — LIPASE, BLOOD: Lipase: 17 U/L — ABNORMAL LOW (ref 22–51)

## 2015-06-19 LAB — COMPREHENSIVE METABOLIC PANEL
ALT: 15 U/L (ref 14–54)
AST: 18 U/L (ref 15–41)
Albumin: 4.2 g/dL (ref 3.5–5.0)
Alkaline Phosphatase: 69 U/L (ref 38–126)
Anion gap: 8 (ref 5–15)
BUN: 7 mg/dL (ref 6–20)
CO2: 24 mmol/L (ref 22–32)
CREATININE: 0.9 mg/dL (ref 0.44–1.00)
Calcium: 9.3 mg/dL (ref 8.9–10.3)
Chloride: 104 mmol/L (ref 101–111)
GFR calc Af Amer: >60 mL/min (ref >60)
GFR calc non Af Amer: >60 mL/min (ref >60)
Glucose, Bld: 103 mg/dL — ABNORMAL HIGH (ref 65–99)
Potassium: 3.8 mmol/L (ref 3.5–5.1)
Sodium: 136 mmol/L (ref 135–145)
Total Bilirubin: 0.7 mg/dL (ref 0.3–1.2)
Total Protein: 6.8 g/dL (ref 6.5–8.1)

## 2015-06-19 LAB — CBC
HEMATOCRIT: 40.8 % (ref 36.0–46.0)
HEMOGLOBIN: 13.9 g/dL (ref 12.0–15.0)
MCH: 28.5 pg (ref 26.0–34.0)
MCHC: 34.1 g/dL (ref 30.0–36.0)
MCV: 83.6 fL (ref 78.0–100.0)
Platelets: 215 10*3/uL (ref 150–400)
RBC: 4.88 MIL/uL (ref 3.87–5.11)
RDW: 12.5 % (ref 11.5–15.5)
WBC: 6.4 10*3/uL (ref 4.0–10.5)

## 2015-06-19 LAB — I-STAT BETA HCG BLOOD, ED (MC, WL, AP ONLY): I-stat hCG, quantitative: <5 m[IU]/mL (ref <5)

## 2015-06-19 MED ORDER — ONDANSETRON 4 MG PO TBDP
8.0000 mg | ORAL_TABLET | Freq: Once | ORAL | Status: AC
  Administered 2015-06-19: 8 mg via ORAL
  Filled 2015-06-19: qty 2

## 2015-06-19 MED ORDER — KETOROLAC TROMETHAMINE 30 MG/ML IJ SOLN
30.0000 mg | Freq: Once | INTRAMUSCULAR | Status: AC
  Administered 2015-06-19: 30 mg via INTRAMUSCULAR
  Filled 2015-06-19: qty 1

## 2015-06-19 NOTE — ED Notes (Signed)
Pt here for vomiting and migraine x 3 days. sts unable to hold down food or fluids.

## 2015-06-19 NOTE — Discharge Instructions (Signed)

## 2015-06-19 NOTE — ED Provider Notes (Signed)
CSN: [id number]     Arrival date & time 06/19/15  1317 History  This chart was scribed for Zadie Rhine, MD by Leone Payor, ED Scribe. This patient was seen in room TR03C/TR03C and the patient's care was started 2:44 PM.    Chief Complaint  Patient presents with  . Migraine  . Emesis    The history is provided by the patient. No language interpreter was used.     HPI Comments: Mia Johnson is a 19 y.o. female who presents to the Emergency Department complaining of 3 days of sudden onset, constant, unchanged HA with associated intermittent vomiting and sore throat, and bilateral ear pain. She reports history of migraines in the past but states this feels different as it feels like there is fluid in her head. She has only used throat lozenges and Chloraseptic spray for her throat; states she has been unable to swallow pills for her HA or nausea due to pain.  She has a history of cyclic vomiting syndrome and reports having associated mild chest pain and abdominal pain after episodes of vomiting. She denies history of CVA, brain aneurysms, brain injuries, or brain surgery. She denies fever, hematochezia, extremity weakness or numbness, epistaxis, vision loss or blindness.   Past Medical History  Diagnosis Date  . Asthma, moderate persistent   . Pes planus (flat feet)   . Environmental allergies   . Marijuana smoker   . Abdominal pain   . Depression   . PTSD (post-traumatic stress disorder)   . Ovarian cyst    Past Surgical History  Procedure Laterality Date  . Nose surgery     Family History  Problem Relation Age of Onset  . Obesity Mother    Social History  Substance Use Topics  . Smoking status: Former Smoker    Quit date: 03/07/2014  . Smokeless tobacco: Never Used     Comment: smokes marajuana at times  . Alcohol Use: Yes     Comment: ocassionally   OB History    No data available     Review of Systems  Constitutional: Positive for fatigue. Negative for fever.   HENT: Positive for ear pain and sore throat. Negative for trouble swallowing.   Eyes: Negative for visual disturbance.  Respiratory: Negative for shortness of breath.   Cardiovascular: Positive for chest pain ( with vomiting).  Gastrointestinal: Positive for nausea, vomiting and abdominal pain (with vomiting ).  Neurological: Positive for headaches. Negative for weakness and numbness.  All other systems reviewed and are negative.     Allergies  Shellfish-derived products  Home Medications   Prior to Admission medications   Medication Sig Start Date End Date Taking? Authorizing Provider  albuterol (PROVENTIL HFA;VENTOLIN HFA) 108 (90 BASE) MCG/ACT inhaler Inhale 2 puffs into the lungs every 6 (six) hours as needed for wheezing or shortness of breath. 09/19/14   Nani Ravens, MD  clidinium-chlordiazePOXIDE (LIBRAX) 5-2.5 MG per capsule Take 1 capsule by mouth at bedtime. 12/22/14   [name], MD  EPINEPHrine (EPIPEN 2-PAK) 0.3 mg/0.3 mL IJ SOAJ injection Inject 0.3 mLs (0.3 mg total) into the muscle once. 09/30/14   Nani Ravens, MD  ibuprofen (ADVIL,MOTRIN) 400 MG tablet Take 1 tablet (400 mg total) by mouth every 6 (six) hours as needed for moderate pain. 01/26/15   Fayrene Helper, PA-C  ondansetron [facility]) 8 MG tablet One each morning Patient not taking: Reported on 01/26/2015 12/08/14   [name], MD  polyethylene glycol powder [facility])  powder Mix 4 capfuls or 4 packets in each 32 ounce gatorade bottle Patient not taking: Reported on 01/26/2015 01/12/15   Vanessa Ralphs, MD  QUEtiapine (SEROQUEL) 25 MG tablet Take 1 tablet (25 mg total) by mouth at bedtime. 03/02/15   [name], MD  rizatriptan (MAXALT-MLT) 10 MG disintegrating tablet Take 1 tablet (10 mg total) by mouth as needed for migraine. May repeat in 2 hours if needed. No more than 4 pills in one week 12/08/14   [name], MD  sucralfate (CARAFATE) 1 GM/10ML suspension Take 10 mLs (1 g  total) by mouth 4 (four) times daily -  with meals and at bedtime. 02/16/15   [name], MD  triazolam (HALCION) 0.25 MG tablet Take 1 tablet (0.25 mg total) by mouth at bedtime as needed for sleep. For 5/14 03/02/15   [name], MD   BP 135/63 mmHg  Pulse 95  Temp(Src) 99.2 F (37.3 C) (Oral)  Resp 18  SpO2 100%  LMP 06/05/2015 Physical Exam  Nursing note and vitals reviewed.  CONSTITUTIONAL: Well developed/well nourished HEAD: Normocephalic/atraumatic EYES: EOMI/PERRL, no nystagmus, no ptosis ENMT: Mucous membranes moist, uvula midline, no erythema or exudates  NECK: supple no meningeal signs, no bruits CV: S1/S2 noted, no murmurs/rubs/gallops noted LUNGS: Lungs are clear to auscultation bilaterally, no apparent distress ABDOMEN: soft, nontender, no rebound or guarding GU:no cva tenderness NEURO:Awake/alert, facies symmetric, no arm or leg drift is noted Equal 5/5 strength with shoulder abduction, elbow flex/extension, wrist flex/extension in upper extremities and equal hand grips bilaterally Equal 5/5 strength with hip flexion,knee flex/extension, foot dorsi/plantar flexion Cranial nerves 3/4/5/6/05/12/09/11/12 tested and intact Gait normal without ataxia No past pointing Sensation to light touch intact in all extremities EXTREMITIES: pulses normal, full ROM SKIN: warm, color normal PSYCH: no abnormalities of mood noted, alert and oriented to situation   ED Course  Procedures   DIAGNOSTIC STUDIES: Oxygen Saturation is 100% on RA, normal by my interpretation.    COORDINATION OF CARE: 2:50 PM Discussed treatment plan with pt at bedside and pt agreed to plan.  pt improved She is well appearing, no neuro deficits She admits most of her pain is in frontal region also with sinus/dental pain and sore throat Doubt meningitis Doubt SAH or other acute neurologic process  Labs Review Labs Reviewed  LIPASE, BLOOD - Abnormal; Notable for the following:    Lipase  17 (*)    All other components within normal limits  COMPREHENSIVE METABOLIC PANEL - Abnormal; Notable for the following:    Glucose, Bld 103 (*)    All other components within normal limits  CBC  URINALYSIS, ROUTINE W REFLEX MICROSCOPIC (NOT AT [facility])  I-STAT BETA HCG BLOOD, ED ([facility], [facility], [facility] ONLY)    Medications  ondansetron (ZOFRAN-ODT) disintegrating tablet 8 mg (8 mg Oral Given 06/19/15 1517)  ketorolac (TORADOL) 30 MG/ML injection 30 mg (30 mg Intramuscular Given 06/19/15 1517)     MDM   Final diagnoses:  Other headache syndrome  Sore throat  Non-intractable vomiting with nausea, vomiting of unspecified type    Nursing notes including past medical history and social history reviewed and considered in documentation Labs/vital reviewed myself and considered during evaluation   I personally performed the services described in this documentation, which was scribed in my presence. The recorded information has been reviewed and is accurate.      Zadie Rhine, MD 06/19/15 [phone]

## 2015-06-26 ENCOUNTER — Emergency Department (HOSPITAL_COMMUNITY): Payer: Medicaid Other

## 2015-06-26 ENCOUNTER — Encounter (HOSPITAL_COMMUNITY): Payer: Self-pay | Admitting: Vascular Surgery

## 2015-06-26 ENCOUNTER — Emergency Department (HOSPITAL_COMMUNITY)
Admission: EM | Admit: 2015-06-26 | Discharge: 2015-06-26 | Disposition: A | Discharge status: Home / Self Care | Payer: Medicaid Other | Attending: Emergency Medicine | Admitting: Emergency Medicine

## 2015-06-26 DIAGNOSIS — Z8659 Personal history of other mental and behavioral disorders: Secondary | ICD-10-CM | POA: Diagnosis not present

## 2015-06-26 DIAGNOSIS — Z87891 Personal history of nicotine dependence: Secondary | ICD-10-CM | POA: Diagnosis not present

## 2015-06-26 DIAGNOSIS — S70212A Abrasion, left hip, initial encounter: Secondary | ICD-10-CM | POA: Insufficient documentation

## 2015-06-26 DIAGNOSIS — Z8739 Personal history of other diseases of the musculoskeletal system and connective tissue: Secondary | ICD-10-CM | POA: Insufficient documentation

## 2015-06-26 DIAGNOSIS — Z8742 Personal history of other diseases of the female genital tract: Secondary | ICD-10-CM | POA: Diagnosis not present

## 2015-06-26 DIAGNOSIS — Y999 Unspecified external cause status: Secondary | ICD-10-CM | POA: Diagnosis not present

## 2015-06-26 DIAGNOSIS — S00412A Abrasion of left ear, initial encounter: Secondary | ICD-10-CM | POA: Diagnosis not present

## 2015-06-26 DIAGNOSIS — S0993XA Unspecified injury of face, initial encounter: Secondary | ICD-10-CM | POA: Diagnosis present

## 2015-06-26 DIAGNOSIS — S0081XA Abrasion of other part of head, initial encounter: Secondary | ICD-10-CM | POA: Diagnosis not present

## 2015-06-26 DIAGNOSIS — Z79899 Other long term (current) drug therapy: Secondary | ICD-10-CM | POA: Diagnosis not present

## 2015-06-26 DIAGNOSIS — Y929 Unspecified place or not applicable: Secondary | ICD-10-CM | POA: Insufficient documentation

## 2015-06-26 DIAGNOSIS — Y9389 Activity, other specified: Secondary | ICD-10-CM | POA: Insufficient documentation

## 2015-06-26 DIAGNOSIS — S0083XA Contusion of other part of head, initial encounter: Secondary | ICD-10-CM | POA: Diagnosis not present

## 2015-06-26 DIAGNOSIS — J45909 Unspecified asthma, uncomplicated: Secondary | ICD-10-CM | POA: Insufficient documentation
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
## 2015-06-26 NOTE — Discharge Instructions (Signed)
Assault, General  Assault includes any behavior, whether intentional or reckless, which results in bodily injury to another person and/or damage to property. Included in this would be any behavior, intentional or reckless, that by its nature would be understood (interpreted) by a reasonable person as intent to harm another person or to damage his/her property. Threats may be oral or written. They may be communicated through regular mail, computer, fax, or phone. These threats may be direct or implied.  FORMS OF ASSAULT INCLUDE:  · Physically assaulting a person. This includes physical threats to inflict physical harm as well as:  ¨ Slapping.  ¨ Hitting.  ¨ Poking.  ¨ Kicking.  ¨ Punching.  ¨ Pushing.  · Arson.  · Sabotage.  · Equipment vandalism.  · Damaging or destroying property.  · Throwing or hitting objects.  · Displaying a weapon or an object that appears to be a weapon in a threatening manner.  ¨ Carrying a firearm of any kind.  ¨ Using a weapon to harm someone.  · Using greater physical size/strength to intimidate another.  ¨ Making intimidating or threatening gestures.  ¨ Bullying.  ¨ Hazing.  · Intimidating, threatening, hostile, or abusive language directed toward another person.  ¨ It communicates the intention to engage in violence against that person. And it leads a reasonable person to expect that violent behavior may occur.  · Stalking another person.  IF IT HAPPENS AGAIN:  · Immediately call for emergency help (911 in [place]  · If someone poses clear and immediate danger to you, seek legal authorities to have a protective or restraining order put in place.  · Less threatening assaults can at least be reported to authorities.  STEPS TO TAKE IF A SEXUAL ASSAULT HAS HAPPENED  · Go to an area of safety. This may include a shelter or staying with a friend. Stay away from the area where you have been attacked. A large percentage of sexual assaults are caused by a friend, relative or associate.  · If  medications were given by your caregiver, take them as directed for the full length of time prescribed.  · Only take over-the-counter or prescription medicines for pain, discomfort, or fever as directed by your caregiver.  · If you have come in contact with a sexual disease, find out if you are to be tested again. If your caregiver is concerned about the HIV/AIDS virus, he/she may require you to have continued testing for several months.  · For the protection of your privacy, test results can not be given over the phone. Make sure you receive the results of your test. If your test results are not back during your visit, make an appointment with your caregiver to find out the results. Do not assume everything is normal if you have not heard from your caregiver or the medical facility. It is important for you to follow up on all of your test results.  · File appropriate papers with authorities. This is important in all assaults, even if it has occurred in a family or by a friend.  SEEK MEDICAL CARE IF:  · You have new problems because of your injuries.  · You have problems that may be because of the medicine you are taking, such as:  ¨ Rash.  ¨ Itching.  ¨ Swelling.  ¨ Trouble breathing.  · You develop belly (abdominal) pain, feel sick to your stomach (nausea) or are vomiting.  · You begin to run a temperature.  · You   need supportive care or referral to a rape crisis center. These are centers with trained personnel who can help you get through this ordeal.  SEEK IMMEDIATE MEDICAL CARE IF:  · You are afraid of being threatened, beaten, or abused. In [place] call 911.  · You receive new injuries related to abuse.  · You develop severe pain in any area injured in the assault or have any change in your condition that concerns you.  · You faint or lose consciousness.  · You develop chest pain or shortness of breath.  Document Released: 10/21/2005 Document Revised: 01/13/2012 Document Reviewed: 06/08/2008  [employer]® Patient  Information ©2015 [employer]. This information is not intended to replace advice given to you by your health care provider. Make sure you discuss any questions you have with your health care provider.
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
## 2015-07-09 ENCOUNTER — Emergency Department (HOSPITAL_COMMUNITY)
Admission: EM | Admit: 2015-07-09 | Discharge: 2015-07-10 | Disposition: A | Discharge status: Home / Self Care | Payer: Medicaid Other | Attending: Emergency Medicine | Admitting: Emergency Medicine

## 2015-07-09 ENCOUNTER — Encounter (HOSPITAL_COMMUNITY): Payer: Self-pay | Admitting: Emergency Medicine

## 2015-07-09 DIAGNOSIS — J4540 Moderate persistent asthma, uncomplicated: Secondary | ICD-10-CM | POA: Diagnosis not present

## 2015-07-09 DIAGNOSIS — S46911A Strain of unspecified muscle, fascia and tendon at shoulder and upper arm level, right arm, initial encounter: Secondary | ICD-10-CM | POA: Diagnosis not present

## 2015-07-09 DIAGNOSIS — Z8742 Personal history of other diseases of the female genital tract: Secondary | ICD-10-CM | POA: Diagnosis not present

## 2015-07-09 DIAGNOSIS — X58XXXA Exposure to other specified factors, initial encounter: Secondary | ICD-10-CM | POA: Insufficient documentation

## 2015-07-09 DIAGNOSIS — Y9389 Activity, other specified: Secondary | ICD-10-CM | POA: Insufficient documentation

## 2015-07-09 DIAGNOSIS — F329 Major depressive disorder, single episode, unspecified: Secondary | ICD-10-CM | POA: Insufficient documentation

## 2015-07-09 DIAGNOSIS — Y998 Other external cause status: Secondary | ICD-10-CM | POA: Diagnosis not present

## 2015-07-09 DIAGNOSIS — Y9289 Other specified places as the place of occurrence of the external cause: Secondary | ICD-10-CM | POA: Diagnosis not present

## 2015-07-09 DIAGNOSIS — F4310 Post-traumatic stress disorder, unspecified: Secondary | ICD-10-CM | POA: Insufficient documentation

## 2015-07-09 DIAGNOSIS — Z720 Tobacco use: Secondary | ICD-10-CM | POA: Diagnosis not present

## 2015-07-09 DIAGNOSIS — R52 Pain, unspecified: Secondary | ICD-10-CM

## 2015-07-09 DIAGNOSIS — S4991XA Unspecified injury of right shoulder and upper arm, initial encounter: Secondary | ICD-10-CM | POA: Diagnosis present

## 2015-07-09 MED ORDER — KETOROLAC TROMETHAMINE 30 MG/ML IJ SOLN
30.0000 mg | Freq: Once | INTRAMUSCULAR | Status: AC
  Administered 2015-07-10: 30 mg via INTRAMUSCULAR
  Filled 2015-07-09: qty 1

## 2015-07-09 MED ORDER — METHOCARBAMOL 500 MG PO TABS
750.0000 mg | ORAL_TABLET | ORAL | Status: AC
  Administered 2015-07-10: 750 mg via ORAL
  Filled 2015-07-09: qty 2
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
## 2015-07-09 NOTE — ED Notes (Signed)
Pt states that she cannot feel pinkie finger on rt hand. Capillary response >3 seconds.
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
## 2015-07-09 NOTE — ED Provider Notes (Signed)
CSN: [id number]     Arrival date & time 07/09/15  2327 History   First MD Initiated Contact with Patient 07/09/15 2347     Chief Complaint  Patient presents with  . Elbow Pain  . Shoulder Pain     (Consider location/radiation/quality/duration/timing/severity/associated sxs/prior Treatment) HPI Comments: Patient states she was wrestling on the floor with her nephew and somehow her right arm got twisted.  She now has pain in her right shoulder, right rib area and elbow.  Has not taken any medication for her discomfort.  EMS was cold.  They placed a immobilizer sling without decrease in her discomfort  Patient is a 19 y.o. female presenting with shoulder pain. The history is provided by the patient.  Shoulder Pain Location:  Shoulder Shoulder location:  R shoulder Pain details:    Quality:  Aching   Radiates to:  R arm   Severity:  Moderate   Onset quality:  Sudden   Timing:  Constant   Progression:  Unchanged Chronicity:  New Handedness:  Right-handed Dislocation: no   Foreign body present:  No foreign bodies Prior injury to area:  No Relieved by:  None tried Worsened by:  Movement Ineffective treatments:  None tried Associated symptoms: no fever, no neck pain, no stiffness, no swelling and no tingling     Past Medical History  Diagnosis Date  . Asthma, moderate persistent   . Pes planus (flat feet)   . Environmental allergies   . Marijuana smoker   . Abdominal pain   . Depression   . PTSD (post-traumatic stress disorder)   . Ovarian cyst    Past Surgical History  Procedure Laterality Date  . Nose surgery     Family History  Problem Relation Age of Onset  . Obesity Mother    Social History  Substance Use Topics  . Smoking status: Light Tobacco Smoker -- 0.10 packs/day    Types: Cigarettes    Last Attempt to Quit: 03/07/2014  . Smokeless tobacco: Never Used     Comment: smokes marajuana at times  . Alcohol Use: Yes     Comment: ocassionally   OB History    No data available     Review of Systems  Constitutional: Negative for fever.  Musculoskeletal: Negative for joint swelling, stiffness and neck pain.  Skin: Negative for wound.  Neurological: Negative for weakness and numbness.  All other systems reviewed and are negative.     Allergies  Shellfish-derived products  Home Medications   Prior to Admission medications   Medication Sig Start Date End Date Taking? Authorizing Provider  albuterol (PROVENTIL HFA;VENTOLIN HFA) 108 (90 BASE) MCG/ACT inhaler Inhale 2 puffs into the lungs every 6 (six) hours as needed for wheezing or shortness of breath. 09/19/14  Yes Nani Ravens, MD  EPINEPHrine (EPIPEN 2-PAK) 0.3 mg/0.3 mL IJ SOAJ injection Inject 0.3 mLs (0.3 mg total) into the muscle once. 09/30/14  Yes Nani Ravens, MD  clidinium-chlordiazePOXIDE (LIBRAX) 5-2.5 MG per capsule Take 1 capsule by mouth at bedtime. Patient not taking: Reported on 06/26/2015 12/22/14   [name], MD  ibuprofen (ADVIL,MOTRIN) 800 MG tablet Take 1 tablet (800 mg total) by mouth 3 (three) times daily. 07/10/15   Elpidio Anis, PA-C  QUEtiapine (SEROQUEL) 25 MG tablet Take 1 tablet (25 mg total) by mouth at bedtime. Patient not taking: Reported on 06/26/2015 03/02/15   [name], MD  rizatriptan (MAXALT-MLT) 10 MG disintegrating tablet Take 1 tablet (10 mg total)  by mouth as needed for migraine. May repeat in 2 hours if needed. No more than 4 pills in one week Patient not taking: Reported on 06/26/2015 12/08/14   [name], MD  sucralfate (CARAFATE) 1 GM/10ML suspension Take 10 mLs (1 g total) by mouth 4 (four) times daily -  with meals and at bedtime. Patient not taking: Reported on 06/26/2015 02/16/15   [name], MD  triazolam (HALCION) 0.25 MG tablet Take 1 tablet (0.25 mg total) by mouth at bedtime as needed for sleep. For 5/14 Patient not taking: Reported on 06/26/2015 03/02/15   [name], MD   BP 118/62 mmHg  Pulse 75   Temp(Src) 98.1 F (36.7 C) (Oral)  Resp 14  Ht 5\' 9"  (1.753 m)  Wt 134 lb (60.782 kg)  BMI 19.78 kg/m2  SpO2 98%  LMP 06/05/2015 (Within Weeks) Physical Exam  Constitutional: She appears well-developed and well-nourished.  HENT:  Head: Normocephalic.  Eyes: Pupils are equal, round, and reactive to light.  Neck: Normal range of motion.  Pulmonary/Chest: Effort normal and breath sounds normal.   She exhibits tenderness.  Abdominal: Soft.  Musculoskeletal: She exhibits tenderness. She exhibits no edema.       Right shoulder: She exhibits tenderness and pain. She exhibits normal range of motion, no swelling, no effusion, no deformity, no laceration, no spasm, normal pulse and normal strength.       Right elbow: She exhibits normal range of motion, no swelling, no effusion, no deformity and no laceration. Tenderness found. Medial epicondyle tenderness noted.  Neurological: She is alert.  Skin: Skin is warm and dry.  Vitals reviewed.   ED Course  Procedures (including critical care time) Labs Review Labs Reviewed - No data to display  Imaging Review Dg Ribs Unilateral W/chest Right  07/10/2015   CLINICAL DATA:  Injury tonight. Pain under the shoulder blade and posterior right upper ribs.  EXAM: RIGHT RIBS AND CHEST - 3+ VIEW  COMPARISON:  09/22/2014  FINDINGS: Normal heart size and pulmonary vascularity. No focal airspace disease or consolidation in the lungs. No blunting of costophrenic angles. No pneumothorax. Mediastinal contours appear intact.  Right ribs appear intact. No evidence of acute fracture, displacement, or focal bone lesion.  IMPRESSION: No evidence of active pulmonary disease.  Right ribs appear intact.   Electronically Signed   By: Burman Nieves M.D.   On: 07/10/2015 01:50   Dg Shoulder Right  07/10/2015   CLINICAL DATA:  Pain under the shoulder blade and posterior right ribs after injury with a pop.  EXAM: RIGHT SHOULDER - 2+ VIEW  COMPARISON:  Right humerus  12/21/2006  FINDINGS: There is no evidence of fracture or dislocation. There is no evidence of arthropathy or other focal bone abnormality. Soft tissues are unremarkable.  IMPRESSION: Negative.   Electronically Signed   By: Burman Nieves M.D.   On: 07/10/2015 01:48   I have personally reviewed and evaluated these images and lab results as part of my medical decision-making.   EKG Interpretation None      MDM   Final diagnoses:  Right shoulder strain, initial encounter         Earley Favor, NP 07/10/15 1610  Zadie Rhine, MD 07/11/15 [phone]

## 2015-07-10 ENCOUNTER — Other Ambulatory Visit (HOSPITAL_COMMUNITY): Payer: Self-pay

## 2015-07-10 ENCOUNTER — Emergency Department (HOSPITAL_COMMUNITY): Payer: Medicaid Other

## 2015-07-10 MED ORDER — IBUPROFEN 800 MG PO TABS: 800.0000 mg | ORAL_TABLET | Freq: Three times a day (TID) | ORAL | Status: DC

## 2015-07-10 NOTE — Discharge Instructions (Signed)
Cryotherapy °Cryotherapy means treatment with cold. Ice or gel packs can be used to reduce both pain and swelling. Ice is the most helpful within the first 24 to 48 hours after an injury or flare-up from overusing a muscle or joint. Sprains, strains, spasms, burning pain, shooting pain, and aches can all be eased with ice. Ice can also be used when recovering from surgery. Ice is effective, has very few side effects, and is safe for most people to use. °PRECAUTIONS  °Ice is not a safe treatment option for people with: °· Raynaud phenomenon. This is a condition affecting small blood vessels in the extremities. Exposure to cold may cause your problems to return. °· Cold hypersensitivity. There are many forms of cold hypersensitivity, including: °· Cold urticaria. Red, itchy hives appear on the skin when the tissues begin to warm after being iced. °· Cold erythema. This is a red, itchy rash caused by exposure to cold. °· Cold hemoglobinuria. Red blood cells break down when the tissues begin to warm after being iced. The hemoglobin that carry oxygen are passed into the urine because they cannot combine with blood proteins fast enough. °· Numbness or altered sensitivity in the area being iced. °If you have any of the following conditions, do not use ice until you have discussed cryotherapy with your caregiver: °· Heart conditions, such as arrhythmia, angina, or chronic heart disease. °· High blood pressure. °· Healing wounds or open skin in the area being iced. °· Current infections. °· Rheumatoid arthritis. °· Poor circulation. °· Diabetes. °Ice slows the blood flow in the region it is applied. This is beneficial when trying to stop inflamed tissues from spreading irritating chemicals to surrounding tissues. However, if you expose your skin to cold temperatures for too long or without the proper protection, you can damage your skin or nerves. Watch for signs of skin damage due to cold. °HOME CARE INSTRUCTIONS °Follow  these tips to use ice and cold packs safely. °· Place a dry or damp towel between the ice and skin. A damp towel will cool the skin more quickly, so you may need to shorten the time that the ice is used. °· For a more rapid response, add gentle compression to the ice. °· Ice for no more than 10 to 20 minutes at a time. The bonier the area you are icing, the less time it will take to get the benefits of ice. °· Check your skin after 5 minutes to make sure there are no signs of a poor response to cold or skin damage. °· Rest 20 minutes or more between uses. °· Once your skin is numb, you can end your treatment. You can test numbness by very lightly touching your skin. The touch should be so light that you do not see the skin dimple from the pressure of your fingertip. When using ice, most people will feel these normal sensations in this order: cold, burning, aching, and numbness. °· Do not use ice on someone who cannot communicate their responses to pain, such as small children or people with dementia. °HOW TO MAKE AN ICE PACK °Ice packs are the most common way to use ice therapy. Other methods include ice massage, ice baths, and cryosprays. Muscle creams that cause a cold, tingly feeling do not offer the same benefits that ice offers and should not be used as a substitute unless recommended by your caregiver. °To make an ice pack, do one of the following: °· Place crushed ice or a   bag of frozen vegetables in a sealable plastic bag. Squeeze out the excess air. Place this bag inside another plastic bag. Slide the bag into a pillowcase or place a damp towel between your skin and the bag. °· Mix 3 parts water with 1 part rubbing alcohol. Freeze the mixture in a sealable plastic bag. When you remove the mixture from the freezer, it will be slushy. Squeeze out the excess air. Place this bag inside another plastic bag. Slide the bag into a pillowcase or place a damp towel between your skin and the bag. °SEEK MEDICAL CARE  IF: °· You develop white spots on your skin. This may give the skin a blotchy (mottled) appearance. °· Your skin turns blue or pale. °· Your skin becomes waxy or hard. °· Your swelling gets worse. °MAKE SURE YOU:  °· Understand these instructions. °· Will watch your condition. °· Will get help right away if you are not doing well or get worse. °Document Released: 06/17/2011 Document Revised: 03/07/2014 Document Reviewed: 06/17/2011 °[employer]® Patient Information ©2015 [employer]. This information is not intended to replace advice given to you by your health care provider. Make sure you discuss any questions you have with your health care provider. ° °Joint Sprain °A sprain is a tear or stretch in the ligaments that hold a joint together. Severe sprains may need as long as 3-6 weeks of immobilization and/or exercises to heal completely. Sprained joints should be rested and protected. If not, they can become unstable and prone to re-injury. Proper treatment can reduce your pain, shorten the period of disability, and reduce the risk of repeated injuries. °TREATMENT  °· Rest and elevate the injured joint to reduce pain and swelling. °· Apply ice packs to the injury for 20-30 minutes every 2-3 hours for the next 2-3 days. °· Keep the injury wrapped in a compression bandage or splint as long as the joint is painful or as instructed by your caregiver. °· Do not use the injured joint until it is completely healed to prevent re-injury and chronic instability. Follow the instructions of your caregiver. °· Long-term sprain management may require exercises and/or treatment by a physical therapist. Taping or special braces may help stabilize the joint until it is completely better. °SEEK MEDICAL CARE IF:  °· You develop increased pain or swelling of the joint. °· You develop increasing redness and warmth of the joint. °· You develop a fever. °· It becomes stiff. °· Your hand or foot gets cold or numb. °Document Released:  11/28/2004 Document Revised: 01/13/2012 Document Reviewed: 11/07/2008 °[employer]® Patient Information ©2015 [employer]. This information is not intended to replace advice given to you by your health care provider. Make sure you discuss any questions you have with your health care provider. ° °

## 2015-07-10 NOTE — ED Provider Notes (Signed)
Twisting injury to right UE pending xrays of shoulder and ribs.   Plan: anticipate negative x-rays - home with anti-inflammatory medications and PCP follow up.  Imaging negative. Discussed supportive management with the patient. Discharged home.  Elpidio Anis, PA-C 07/10/15 7829  Zadie Rhine, MD 07/10/15 [phone]

## 2015-07-10 NOTE — ED Notes (Signed)
Pt discharged to home

## 2015-07-13 ENCOUNTER — Ambulatory Visit (INDEPENDENT_AMBULATORY_CARE_PROVIDER_SITE_OTHER): Payer: Medicaid Other | Admitting: Family Medicine

## 2015-07-13 ENCOUNTER — Encounter: Payer: Self-pay | Admitting: Family Medicine

## 2015-07-13 VITALS — BP 118/52 | HR 79 | Temp 98.2°F | Wt 126.0 lb

## 2015-07-13 DIAGNOSIS — G43709 Chronic migraine without aura, not intractable, without status migrainosus: Secondary | ICD-10-CM | POA: Diagnosis present

## 2015-07-13 MED ORDER — SUMATRIPTAN SUCCINATE 50 MG PO TABS: 50.0000 mg | ORAL_TABLET | Freq: Once | ORAL | Status: DC

## 2015-07-13 NOTE — Patient Instructions (Addendum)
Dear Mia Johnson, Thank you for coming in to clinic today.  1. It sounds like you are experiencing persistent Chronic Migraine Headache, I don't see any further signs of other or new injury. I don't think this is only because of the recent trauma, but it likely triggered it. 2. Try Sumatriptan 50mg  tablets - take 1 if severe headache returns, may repeat dose in 2 hours, it potentially can cause some chest pain and discomfort within 5-10mins of taking it, this is a normal side-effect. NO MORE DOSES IN 24 hours. Next day if still have severe headache, can increase dose to 100mg  (2 tabs) and again may repeat in 2 hours (2 tabs, 100mg ) if still headache 3. Insurance limits number of pills I can give you 4. Alternatively you may want to try Tylenol extra strength 500mg  - take 2 tablets every 6 hours as needed for headache, avoid caffeine, you may try again the high dose Ibuprofen 800mg  every 8 hours (3x daily) 5. Try ice packs, heating pad, avoid triggers  Please schedule a follow-up appointment with Dr. Waynetta Sandy as needed if not improving within 1-2 weeks, or maay follow-up with Dr. Merla Riches as planned  If you have any other questions or concerns, please feel free to call the clinic to contact me. You may also schedule an earlier appointment if necessary.  However, if your symptoms get significantly worse, please go to the Emergency Department to seek immediate medical attention.  Saralyn Pilar, DO [facility]   Migraine Headache A migraine headache is an intense, throbbing pain on one or both sides of your head. A migraine can last for 30 minutes to several hours. CAUSES  The exact cause of a migraine headache is not always known. However, a migraine may be caused when nerves in the brain become irritated and release chemicals that cause inflammation. This causes pain. Certain things may also trigger migraines, such as:  Alcohol.  Smoking.  Stress.  Menstruation.  Aged  cheeses.  Foods or drinks that contain nitrates, glutamate, aspartame, or tyramine.  Lack of sleep.  Chocolate.  Caffeine.  Hunger.  Physical exertion.  Fatigue.  Medicines used to treat chest pain (nitroglycerine), birth control pills, estrogen, and some blood pressure medicines. SIGNS AND SYMPTOMS  Pain on one or both sides of your head.  Pulsating or throbbing pain.  Severe pain that prevents daily activities.  Pain that is aggravated by any physical activity.  Nausea, vomiting, or both.  Dizziness.  Pain with exposure to bright lights, loud noises, or activity.  General sensitivity to bright lights, loud noises, or smells. Before you get a migraine, you may get warning signs that a migraine is coming (aura). An aura may include:  Seeing flashing lights.  Seeing bright spots, halos, or zigzag lines.  Having tunnel vision or blurred vision.  Having feelings of numbness or tingling.  Having trouble talking.  Having muscle weakness. DIAGNOSIS  A migraine headache is often diagnosed based on:  Symptoms.  Physical exam.  A CT scan or MRI of your head. These imaging tests cannot diagnose migraines, but they can help rule out other causes of headaches. TREATMENT Medicines may be given for pain and nausea. Medicines can also be given to help prevent recurrent migraines.  HOME CARE INSTRUCTIONS  Only take over-the-counter or prescription medicines for pain or discomfort as directed by your health care provider. The use of long-term narcotics is not recommended.  Lie down in a dark, quiet room when you have a  migraine.  Keep a journal to find out what may trigger your migraine headaches. For example, write down:  What you eat and drink.  How much sleep you get.  Any change to your diet or medicines.  Limit alcohol consumption.  Quit smoking if you smoke.  Get 7-9 hours of sleep, or as recommended by your health care provider.  Limit  stress.  Keep lights dim if bright lights bother you and make your migraines worse. SEEK IMMEDIATE MEDICAL CARE IF:   Your migraine becomes severe.  You have a fever.  You have a stiff neck.  You have vision loss.  You have muscular weakness or loss of muscle control.  You start losing your balance or have trouble walking.  You feel faint or pass out.  You have severe symptoms that are different from your first symptoms. MAKE SURE YOU:   Understand these instructions.  Will watch your condition.  Will get help right away if you are not doing well or get worse. Document Released: 10/21/2005 Document Revised: 03/07/2014 Document Reviewed: 06/28/2013 [facility] Patient Information 2015 [place], [place]. This information is not intended to replace advice given to you by your health care provider. Make sure you discuss any questions you have with your health care provider.

## 2015-07-13 NOTE — Assessment & Plan Note (Addendum)
Consistent with persistent migraine HA x 2 weeks intermittent worsening, unilateral localized to Left frontal / temporal, which is consistent with recent facial trauma. Unlikely traumatic complication given no fracture, CT negative, no further evidence of injury, but may have had a concussion and triggered migraine vs post-concussive syndrome. Additionally, likely significant psychosocial component to recurrent HA syndrome with multiple stressors at this time - Currently without active HA, well-appearing, no focal neuro deficits, tolerating PO w/o n/v - Inadequately treated for migraine HA, occasional ibuprofen few days, unable to obtain prior abortive therapy triptans due to insurance  Plan: 1. Start abortive therapy with Sumatriptan  tabs - take 1 PRN (#10, 0 refill due to quantity limit), severe HA, may repeat dose within 2 hr if persistent, no more in 24 hours. Within 1-2 days can titrate up to  per dose if no relief at . Checked this is on medicaid PDL. 2. Recommend taking Ibuprofen 600-800mg  q 8 hr PRN, and can try Tylenol  TID alternatively 3. Avoid triggers including foods, caffeine. Important to rest. 4. Consider HA log for more details in future 5. Follow-up with Dr. Merla Riches / PCP as scheduled, need to consider future migraine prophylaxis 6. Return criteria given
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
## 2015-08-09 ENCOUNTER — Emergency Department (HOSPITAL_COMMUNITY): Admission: EM | Admit: 2015-08-09 | Discharge: 2015-08-09 | Payer: Medicaid Other | Source: Home / Self Care

## 2015-08-10 ENCOUNTER — Encounter: Payer: Self-pay | Admitting: Family Medicine

## 2015-08-10 ENCOUNTER — Other Ambulatory Visit (HOSPITAL_COMMUNITY)
Admission: RE | Admit: 2015-08-10 | Discharge: 2015-08-10 | Disposition: A | Discharge status: Home / Self Care | Payer: Medicaid Other | Source: Ambulatory Visit | Attending: Family Medicine | Admitting: Family Medicine

## 2015-08-10 ENCOUNTER — Telehealth: Payer: Self-pay | Admitting: *Deleted

## 2015-08-10 ENCOUNTER — Other Ambulatory Visit: Payer: Self-pay | Admitting: Family Medicine

## 2015-08-10 ENCOUNTER — Ambulatory Visit (INDEPENDENT_AMBULATORY_CARE_PROVIDER_SITE_OTHER): Payer: Medicaid Other | Admitting: Family Medicine

## 2015-08-10 VITALS — BP 123/58 | HR 85 | Temp 98.1°F | Wt 128.0 lb

## 2015-08-10 DIAGNOSIS — Z202 Contact with and (suspected) exposure to infections with a predominantly sexual mode of transmission: Secondary | ICD-10-CM

## 2015-08-10 DIAGNOSIS — Z20828 Contact with and (suspected) exposure to other viral communicable diseases: Secondary | ICD-10-CM | POA: Diagnosis not present

## 2015-08-10 DIAGNOSIS — R300 Dysuria: Secondary | ICD-10-CM | POA: Diagnosis present

## 2015-08-10 DIAGNOSIS — N898 Other specified noninflammatory disorders of vagina: Secondary | ICD-10-CM | POA: Diagnosis not present

## 2015-08-10 DIAGNOSIS — Z113 Encounter for screening for infections with a predominantly sexual mode of transmission: Secondary | ICD-10-CM | POA: Diagnosis present

## 2015-08-10 LAB — POCT URINALYSIS DIPSTICK
Bilirubin, UA: NEGATIVE
GLUCOSE UA: NEGATIVE
Ketones, UA: NEGATIVE
LEUKOCYTES UA: NEGATIVE
NITRITE UA: NEGATIVE
Protein, UA: NEGATIVE
RBC UA: NEGATIVE
Spec Grav, UA: 1.025
UROBILINOGEN UA: 0.2
pH, UA: 6

## 2015-08-10 LAB — POCT WET PREP (WET MOUNT): CLUE CELLS WET PREP WHIFF POC: POSITIVE

## 2015-08-10 MED ORDER — METRONIDAZOLE 500 MG PO TABS: 500.0000 mg | ORAL_TABLET | Freq: Two times a day (BID) | ORAL | Status: DC

## 2015-08-10 NOTE — Assessment & Plan Note (Signed)
Wet prep, GC chlamydia, HIV, RPR collected today No obvious STI on exam Advised on safe sex practices Will follow-up with patient when results of testing when available

## 2015-08-10 NOTE — Assessment & Plan Note (Signed)
Negative urinalysis today Likely related to vaginal discharge complaint - see plan below

## 2015-08-10 NOTE — Telephone Encounter (Signed)
Pt informed. Mia Johnson, CMA  

## 2015-08-10 NOTE — Progress Notes (Signed)
   Subjective:   Mia Johnson is a 19 y.o. female with a history of depression, migraines here for same day appt for vaginal discharge and dysuria.  Dysuria - feels like there is more left to come out after finishing peeing - + subrapubic pain - ongoing for 3 days - no fevers, no hematuria, no urinary frequency - drinking a lot of water  Vaginal discharge - x1 wk - new sexual partner recently - unsure if she other partners - tried [employer] - didn't work - white, malodorous, chunky - thought it might be yeast infection  Review of Systems: Per HPI.    PMH, PSH, Medications, Allergies, and FmHx reviewed in EMR.  Smoking status noted.  Objective:  BP 123/58 mmHg  Pulse 85  Temp(Src) 98.1 F (36.7 C) (Oral)  Wt 128 lb (58.06 kg)  SpO2 100%  LMP 07/21/2015  Gen:  19 y.o. female in NAD HEENT: NCAT, MMM, EOMI CV: RRR, no MRG Resp: Non-labored, CTAB, no wheezes noted Abd: Soft, ND, BS present, mildly TTP suprapubic, no guarding or organomegaly Ext: WWP, no edema GYN:  External genitalia within normal limits.  Vaginal mucosa pink, moist, normal rugae.  Nonfriable cervix without lesions, no bleeding noted, +white thick discharge on speculum exam.  Bimanual exam revealed normal, nongravid uterus.  No cervical motion tenderness. No adnexal masses bilaterally.   Neuro: Alert and oriented, speech normal    Assessment & Plan:     Mia Johnson is a 19 y.o. female here for vaginal discharge and dysuria.  Dysuria Negative urinalysis today Likely related to vaginal discharge complaint - see plan below  Vaginal discharge Wet prep, GC chlamydia, HIV, RPR collected today No obvious STI on exam Advised on safe sex practices Will follow-up with patient when results of testing when available   Erasmo Downer, MD MPH PGY-2,  [facility] 08/10/2015  3:16 PM

## 2015-08-10 NOTE — Patient Instructions (Signed)
Nice to meet you today. I don't think you have a urinary infection. This could be an STD. We are getting labs to check for this today and someone will call you with the results when they're available. Do not have any unprotected sex or share sex toys in the meantime.  Take care, Dr. Jeralyn Ruths Sex Safe sex is about reducing the risk of giving or getting a sexually transmitted disease (STD). STDs are spread through sexual contact involving the genitals, mouth, or rectum. Some STDs can be cured and others cannot. Safe sex can also prevent unintended pregnancies.  WHAT ARE SOME SAFE SEX PRACTICES?  Limit your sexual activity to only one partner who is having sex with only you.  Talk to your partner about his or her past partners, past STDs, and drug use.  Use a condom every time you have sexual intercourse. This includes vaginal, oral, and anal sexual activity. Both females and males should wear condoms during oral sex. Only use latex or polyurethane condoms and water-based lubricants. Using petroleum-based lubricants or oils to lubricate a condom will weaken the condom and increase the chance that it will break. The condom should be in place from the beginning to the end of sexual activity. Wearing a condom reduces, but does not completely eliminate, your risk of getting or giving an STD. STDs can be spread by contact with infected body fluids and skin.  Get vaccinated for hepatitis B and HPV.  Avoid alcohol and recreational drugs, which can affect your judgment. You may forget to use a condom or participate in high-risk sex.  For females, avoid douching after sexual intercourse. Douching can spread an infection farther into the reproductive tract.  Check your body for signs of sores, blisters, rashes, or unusual discharge. See your health care provider if you notice any of these signs.  Avoid sexual contact if you have symptoms of an infection or are being treated for an STD. If you or your  partner has herpes, avoid sexual contact when blisters are present. Use condoms at all other times.  If you are at risk of being infected with HIV, it is recommended that you take a prescription medicine daily to prevent HIV infection. This is called pre-exposure prophylaxis (PrEP). You are considered at risk if:  You are a man who has sex with other men (MSM).  You are a heterosexual man or woman who is sexually active with more than one partner.  You take drugs by injection.  You are sexually active with a partner who has HIV.  Talk with your health care provider about whether you are at high risk of being infected with HIV. If you choose to begin PrEP, you should first be tested for HIV. You should then be tested every 3 months for as long as you are taking PrEP.  See your health care provider for regular screenings, exams, and tests for other STDs. Before having sex with a new partner, each of you should be screened for STDs and should talk about the results with each other. WHAT ARE THE BENEFITS OF SAFE SEX?   There is less chance of getting or giving an STD.  You can prevent unwanted or unintended pregnancies.  By discussing safe sex concerns with your partner, you may increase feelings of intimacy, comfort, trust, and honesty between the two of you.   This information is not intended to replace advice given to you by your health care provider. Make sure you discuss any  questions you have with your health care provider.   Document Released: 11/28/2004 Document Revised: 11/11/2014 Document Reviewed: 04/13/2012 Elsevier Interactive Patient Education [employer]

## 2015-08-10 NOTE — Telephone Encounter (Signed)
-----   Message from Erasmo Downer, MD sent at 08/10/2015  3:28 PM EDT ----- Wet prep consistent with BV.  Rx for flagyl  BID x7 days sent to pharmacy.  Please let patient know.  Erasmo Downer, MD, MPH PGY-2,  [facility] 08/10/2015 3:28 PM

## 2015-08-11 LAB — RPR

## 2015-08-11 LAB — CERVICOVAGINAL ANCILLARY ONLY
CHLAMYDIA, DNA PROBE: NEGATIVE
NEISSERIA GONORRHEA: NEGATIVE

## 2015-08-11 LAB — HIV ANTIBODY (ROUTINE TESTING W REFLEX): HIV 1&2 Ab, 4th Generation: NONREACTIVE

## 2015-08-14 ENCOUNTER — Telehealth: Payer: Self-pay | Admitting: *Deleted

## 2015-08-14 NOTE — Telephone Encounter (Signed)
Left message on voicemail for patient to call back. 

## 2015-08-14 NOTE — Telephone Encounter (Signed)
Patient informed, expressed understanding. 

## 2015-08-14 NOTE — Telephone Encounter (Signed)
-----   Message from Erasmo Downer, MD sent at 08/14/2015  7:33 AM EDT ----- Please let patient know that STD testing all negative (HIV, syphilis, GC/CT).  Wet prep shows BV as previously recorded.  Erasmo Downer, MD, MPH PGY-2,  [facility] 08/14/2015 7:33 AM

## 2015-09-07 ENCOUNTER — Encounter: Payer: Self-pay | Admitting: Internal Medicine

## 2015-09-07 ENCOUNTER — Ambulatory Visit (INDEPENDENT_AMBULATORY_CARE_PROVIDER_SITE_OTHER): Payer: Medicaid Other | Admitting: Internal Medicine

## 2015-09-07 VITALS — BP 117/83 | HR 70 | Ht 69.0 in | Wt 128.0 lb

## 2015-09-07 DIAGNOSIS — G43A0 Cyclical vomiting, not intractable: Secondary | ICD-10-CM | POA: Diagnosis not present

## 2015-09-07 DIAGNOSIS — G479 Sleep disorder, unspecified: Secondary | ICD-10-CM | POA: Diagnosis not present

## 2015-09-07 DIAGNOSIS — R1115 Cyclical vomiting syndrome unrelated to migraine: Secondary | ICD-10-CM

## 2015-09-07 DIAGNOSIS — R109 Unspecified abdominal pain: Secondary | ICD-10-CM

## 2015-09-07 DIAGNOSIS — F4323 Adjustment disorder with mixed anxiety and depressed mood: Secondary | ICD-10-CM | POA: Diagnosis not present

## 2015-09-07 MED ORDER — FLUOXETINE HCL 20 MG PO CAPS: 20.0000 mg | ORAL_CAPSULE | Freq: Every day | ORAL | Status: DC

## 2015-09-07 MED ORDER — ONDANSETRON HCL 4 MG PO TABS: 4.0000 mg | ORAL_TABLET | Freq: Every evening | ORAL | Status: DC | PRN

## 2015-09-07 MED ORDER — TRAZODONE HCL 50 MG PO TABS
25.0000 mg | ORAL_TABLET | Freq: Every evening | ORAL | Status: DC | PRN
Start: 2015-09-07 — End: 2015-09-27

## 2015-09-07 MED ORDER — SUCRALFATE 1 GM/10ML PO SUSP: 1.0000 g | Freq: Three times a day (TID) | ORAL | Status: DC
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
## 2015-09-11 ENCOUNTER — Encounter: Payer: Self-pay | Admitting: Internal Medicine

## 2015-09-12 ENCOUNTER — Ambulatory Visit (INDEPENDENT_AMBULATORY_CARE_PROVIDER_SITE_OTHER): Payer: Medicaid Other | Admitting: Family Medicine

## 2015-09-12 ENCOUNTER — Encounter: Payer: Self-pay | Admitting: Family Medicine

## 2015-09-12 ENCOUNTER — Telehealth: Payer: Self-pay

## 2015-09-12 VITALS — BP 131/48 | HR 71 | Temp 98.4°F | Ht 69.0 in | Wt 129.1 lb

## 2015-09-12 DIAGNOSIS — Z23 Encounter for immunization: Secondary | ICD-10-CM

## 2015-09-12 DIAGNOSIS — J309 Allergic rhinitis, unspecified: Secondary | ICD-10-CM

## 2015-09-12 DIAGNOSIS — J358 Other chronic diseases of tonsils and adenoids: Secondary | ICD-10-CM | POA: Diagnosis present

## 2015-09-12 NOTE — Assessment & Plan Note (Signed)
Not on any allergy medicines currently. Attempted counseling that OTC allergy medicines would not interfere with her current medicines but she did not seem amenable to restarting medicines. Discussed postnasal drip as a common cause for bad breath and sore throats. Follow up as needed.

## 2015-09-12 NOTE — Telephone Encounter (Signed)
Mia Johnson,  Patient has a question about her tonsils - from a doctors appointment she had today.   [phone]

## 2015-09-12 NOTE — Patient Instructions (Addendum)
The "holes" in your throat are fairly common and considered normal, though they can be very annoying. The most common issue they develop is getting food stuck in there and then causing bad breath and tonsil "stones".   Your throat pain looks like it is most likely due to allergies/post nasal drip.   If you continue to have sore throats and you feel they aren't due to allergies come back to clinic so a doctor can look at it and see if it might be strep throat.

## 2015-09-12 NOTE — Assessment & Plan Note (Signed)
Counseled patient about tonsil stones, given some oral hygiene care recommendations. Discussed not an indication for tonsil removal, could refer to ENT for full recommendations but I discussed with her it likely would not be done and she says she will wait on ENT referral if symptoms worsen. Follow up as needed.

## 2015-09-12 NOTE — Progress Notes (Signed)
   Subjective:    Patient ID: Mia Johnson, female    DOB: 10/02/1996, 19 y.o.   MRN: [id number]  HPI  CC: holes in throat  # Holes in back of throat:  Sore throat x 1 week. Gets these often. She says at least 10 in the last year.  Has allergies, used to go get allergy shots, most allergies during season changes now. Doesn't take any allergy medicine. Currently feels postnasal drip.  While brushing teeth noticed several holes in her tonsils  She does get little white pieces coming from the back her throat  Gets bad breath often  States she doesn't like using any allergy medicines, doesn't like the nasal spray because it causes her to vomit (due to "bad stomach") nor like to take over the counter zyrtec because it interferes with other medicine ROS: no fevers or chills  Social Hx: current some tobacco use  Review of Systems   See HPI for ROS.   Past medical history, surgical, family, and social history reviewed and updated in the EMR as appropriate. Objective:  BP 131/48 mmHg  Pulse 71  Temp(Src) 98.4 F (36.9 C) (Oral)  Ht 5\' 9"  (1.753 m)  Wt 129 lb 2 oz (58.571 kg)  BMI 19.06 kg/m2  SpO2 100%  LMP 08/14/2015 Vitals and nursing note reviewed  General: NAD ENTM: Normal sized tonsils bilaterally, no erythema or exudate; there are several small 1mm holes appreciated mostly on the right tonsil without evidence of tonsillith. Nares are normal in appearance, no enlarged or inflammed turbinates. Neck: small palpable submandibular lymph node on the left but does not feel enlarged. Neuro: alert and oriented  Assessment & Plan:  Tonsil stone Counseled patient about tonsil stones, given some oral hygiene care recommendations. Discussed not an indication for tonsil removal, could refer to ENT for full recommendations but I discussed with her it likely would not be done and she says she will wait on ENT referral if symptoms worsen. Follow up as needed.  Allergic  rhinitis Not on any allergy medicines currently. Attempted counseling that OTC allergy medicines would not interfere with her current medicines but she did not seem amenable to restarting medicines. Discussed postnasal drip as a common cause for bad breath and sore throats. Follow up as needed.

## 2015-09-13 NOTE — Telephone Encounter (Signed)
She would like a referral to see ENT because a doctor told her she had holes in her tonsils. She did not know if this is from her stomach condition. She [facility]. He told her that it would take 2 years to build a case to get surgery to get them removed. She has tonsil stones and she does not understand why he would advise this.

## 2015-09-14 NOTE — Telephone Encounter (Signed)
This referral would have to be done by that doctor in order for her insurance to cover it(Dr Waynetta SandyWight)

## 2015-09-18 NOTE — Telephone Encounter (Signed)
Advised pt message. Pt understood.

## 2015-09-24 ENCOUNTER — Encounter: Payer: Self-pay | Admitting: Internal Medicine

## 2015-09-24 ENCOUNTER — Encounter (HOSPITAL_COMMUNITY): Payer: Self-pay | Admitting: Emergency Medicine

## 2015-09-24 ENCOUNTER — Inpatient Hospital Stay (HOSPITAL_COMMUNITY)
Admission: AD | Admit: 2015-09-24 | Discharge: 2015-09-27 | DRG: 885 | Disposition: A | Discharge status: Home / Self Care | Payer: Medicaid Other | Source: Intra-hospital | Attending: Psychiatry | Admitting: Psychiatry

## 2015-09-24 ENCOUNTER — Emergency Department (HOSPITAL_COMMUNITY)
Admission: EM | Admit: 2015-09-24 | Discharge: 2015-09-24 | Disposition: A | Discharge status: Other Acute Inpatient Hospital | Payer: Medicaid Other | Attending: Emergency Medicine | Admitting: Emergency Medicine

## 2015-09-24 ENCOUNTER — Encounter (HOSPITAL_COMMUNITY): Payer: Self-pay | Admitting: *Deleted

## 2015-09-24 DIAGNOSIS — J45909 Unspecified asthma, uncomplicated: Secondary | ICD-10-CM | POA: Insufficient documentation

## 2015-09-24 DIAGNOSIS — F1010 Alcohol abuse, uncomplicated: Secondary | ICD-10-CM | POA: Diagnosis present

## 2015-09-24 DIAGNOSIS — Z79899 Other long term (current) drug therapy: Secondary | ICD-10-CM

## 2015-09-24 DIAGNOSIS — F329 Major depressive disorder, single episode, unspecified: Secondary | ICD-10-CM | POA: Diagnosis present

## 2015-09-24 DIAGNOSIS — Z8739 Personal history of other diseases of the musculoskeletal system and connective tissue: Secondary | ICD-10-CM | POA: Diagnosis not present

## 2015-09-24 DIAGNOSIS — F332 Major depressive disorder, recurrent severe without psychotic features: Secondary | ICD-10-CM | POA: Diagnosis present

## 2015-09-24 DIAGNOSIS — T43212A Poisoning by selective serotonin and norepinephrine reuptake inhibitors, intentional self-harm, initial encounter: Secondary | ICD-10-CM

## 2015-09-24 DIAGNOSIS — Z3202 Encounter for pregnancy test, result negative: Secondary | ICD-10-CM | POA: Insufficient documentation

## 2015-09-24 DIAGNOSIS — F17210 Nicotine dependence, cigarettes, uncomplicated: Secondary | ICD-10-CM | POA: Insufficient documentation

## 2015-09-24 DIAGNOSIS — T1491 Suicide attempt: Secondary | ICD-10-CM | POA: Diagnosis not present

## 2015-09-24 DIAGNOSIS — T50901A Poisoning by unspecified drugs, medicaments and biological substances, accidental (unintentional), initial encounter: Secondary | ICD-10-CM | POA: Diagnosis present

## 2015-09-24 DIAGNOSIS — R45851 Suicidal ideations: Secondary | ICD-10-CM

## 2015-09-24 LAB — COMPREHENSIVE METABOLIC PANEL
ALBUMIN: 4.1 g/dL (ref 3.5–5.0)
ALK PHOS: 55 U/L (ref 38–126)
ALT: 13 U/L — ABNORMAL LOW (ref 14–54)
ANION GAP: 7 (ref 5–15)
AST: 14 U/L — ABNORMAL LOW (ref 15–41)
BILIRUBIN TOTAL: 0.3 mg/dL (ref 0.3–1.2)
BUN: 11 mg/dL (ref 6–20)
CHLORIDE: 110 mmol/L (ref 101–111)
CO2: 24 mmol/L (ref 22–32)
CREATININE: 0.85 mg/dL (ref 0.44–1.00)
Calcium: 9.2 mg/dL (ref 8.9–10.3)
GFR calc non Af Amer: >60 mL/min (ref >60)
GLUCOSE: 102 mg/dL — AB (ref 65–99)
POTASSIUM: 3.4 mmol/L — AB (ref 3.5–5.1)
Sodium: 141 mmol/L (ref 135–145)
TOTAL PROTEIN: 6.9 g/dL (ref 6.5–8.1)

## 2015-09-24 LAB — CBC
HEMATOCRIT: 38.3 % (ref 36.0–46.0)
Hemoglobin: 12.8 g/dL (ref 12.0–15.0)
MCH: 28.1 pg (ref 26.0–34.0)
MCHC: 33.4 g/dL (ref 30.0–36.0)
MCV: 84.2 fL (ref 78.0–100.0)
PLATELETS: 251 10*3/uL (ref 150–400)
RBC: 4.55 MIL/uL (ref 3.87–5.11)
RDW: 12.9 % (ref 11.5–15.5)
WBC: 5.1 10*3/uL (ref 4.0–10.5)

## 2015-09-24 LAB — RAPID URINE DRUG SCREEN, HOSP PERFORMED
AMPHETAMINES: NOT DETECTED
BARBITURATES: NOT DETECTED
Benzodiazepines: NOT DETECTED
Cocaine: NOT DETECTED
Opiates: NOT DETECTED
Tetrahydrocannabinol: NOT DETECTED

## 2015-09-24 LAB — ETHANOL: ALCOHOL ETHYL (B): 112 mg/dL — AB (ref <5)

## 2015-09-24 LAB — SALICYLATE LEVEL: Salicylate Lvl: <4 mg/dL (ref 2.8–30.0)

## 2015-09-24 LAB — POC URINE PREG, ED: PREG TEST UR: NEGATIVE

## 2015-09-24 LAB — CBG MONITORING, ED: GLUCOSE-CAPILLARY: 108 mg/dL — AB (ref 65–99)

## 2015-09-24 LAB — ACETAMINOPHEN LEVEL: Acetaminophen (Tylenol), Serum: <10 ug/mL — ABNORMAL LOW (ref 10–30)

## 2015-09-24 MED ORDER — ALUM & MAG HYDROXIDE-SIMETH 200-200-20 MG/5ML PO SUSP: 30.0000 mL | ORAL | Status: DC | PRN

## 2015-09-24 MED ORDER — HYDROXYZINE HCL 25 MG PO TABS: 25.0000 mg | ORAL_TABLET | Freq: Three times a day (TID) | ORAL | Status: DC | PRN

## 2015-09-24 MED ORDER — ACETAMINOPHEN 325 MG PO TABS
650.0000 mg | ORAL_TABLET | Freq: Four times a day (QID) | ORAL | Status: DC | PRN
Start: 2015-09-24 — End: 2015-09-27

## 2015-09-24 MED ORDER — FLUOXETINE HCL 10 MG PO CAPS: 10.0000 mg | ORAL_CAPSULE | Freq: Every day | ORAL | Status: DC

## 2015-09-24 MED ORDER — ENSURE ENLIVE PO LIQD
237.0000 mL | Freq: Two times a day (BID) | ORAL | Status: DC
  Administered 2015-09-25 – 2015-09-27 (×4): 237 mL via ORAL

## 2015-09-24 MED ORDER — FLUOXETINE HCL 10 MG PO CAPS
10.0000 mg | ORAL_CAPSULE | Freq: Every day | ORAL | Status: DC
  Administered 2015-09-25 – 2015-09-27 (×3): 10 mg via ORAL
  Filled 2015-09-24 (×4): qty 1

## 2015-09-24 MED ORDER — FLUOXETINE HCL 10 MG PO CAPS
10.0000 mg | ORAL_CAPSULE | Freq: Every day | ORAL | Status: DC
  Administered 2015-09-24: 10 mg via ORAL
  Filled 2015-09-24: qty 1

## 2015-09-24 MED ORDER — MAGNESIUM HYDROXIDE 400 MG/5ML PO SUSP: 30.0000 mL | Freq: Every day | ORAL | Status: DC | PRN

## 2015-09-24 NOTE — ED Notes (Signed)
Patients mother into see 

## 2015-09-24 NOTE — ED Notes (Signed)
Poison control contacted-case closed

## 2015-09-24 NOTE — ED Notes (Signed)
Mother at bedside.

## 2015-09-24 NOTE — Progress Notes (Signed)
Mia Johnson is a very pleasant 19 yo female who [facility] due to having an argument with one of her sisters and then ( she says) she impulsively took a handful of pills and " drank some alcohol".   Her history is `pretty straightforward: she says she's " been drinking ( alcohol) only on the weekends for a couple of years now and that she has been experiencing symptoms of depression for quite some time now: decreased appetite, diff sleeping, mood swing, lack of energy, loss of engagement in anything fun. She says , also , that she has been diagnosed with  Cystic vomiting syndrome. She describes a somewhat dysfuntional life with her family in which she describes these " huge" arguments and then she ( impulsively ) will either fight ( whomever shes arguing with ) or just leave the situation. Most recently, she argued with her sister and then impulsively took a handful of trazadone  ( she denies  A suicide attmptt no). She says she has taken Prozac " off and on" over the past ..Marland Kitchen.3 yrs...but says its been awhile since she took any antidepressant thearapeutically. After admission is complete, she contracts for safety annd admission completed.

## 2015-09-24 NOTE — ED Notes (Addendum)
Nad, resting quietly 

## 2015-09-24 NOTE — ED Notes (Signed)
Pt is IVC'd

## 2015-09-24 NOTE — ED Notes (Addendum)
[facility], Pt has no belongings. Mom [facility]

## 2015-09-24 NOTE — ED Notes (Signed)
TTS at bedside. 

## 2015-09-24 NOTE — ED Provider Notes (Signed)
CSN: [id number]     Arrival date & time 09/24/15  4098 History  By signing my name below, I, Phillis Haggis, attest that this documentation has been prepared under the direction and in the presence of Tomasita Crumble, MD. Electronically Signed: Phillis Haggis, ED Scribe. 09/24/2015. 1:52 AM.    Chief Complaint  Patient presents with  . Ingestion   The history is provided by the EMS personnel. No language interpreter was used.  HPI Comments: Mia Johnson is a 19 y.o. Female with a hx of depression and PTSD brought in by EMS who presents to the Emergency Department complaining of ingestion occuring PTA. Per EMS, the pt drank 1/2 of a 4 Loko and "a handful" of 50 mg Trazodone tablets around midnight. Pt told family member that she wanted to die and has had SI in the past, but this is her first attempt. Pt states that she takes Prozac for her depression which has worked for her in the past, but stopped taking it 3 days ago. Mother states that pt has a stomach condition that causes her a lot of stress and that she has "holes in her tonsils." Pt is supposed to see Dr. Merla Riches for a referral to remove her tonsils. She denies HI, fever, chills, coughing, vomiting, diarrhea, or pain anywhere. Per triage note, IVC papers were taken out for the pt.   Past Medical History  Diagnosis Date  . Asthma, moderate persistent   . Pes planus (flat feet)   . Environmental allergies   . Marijuana smoker ([facility])   . Abdominal pain   . Depression   . PTSD (post-traumatic stress disorder)   . Ovarian cyst    Past Surgical History  Procedure Laterality Date  . Nose surgery     Family History  Problem Relation Age of Onset  . Obesity Mother    Social History  Substance Use Topics  . Smoking status: Light Tobacco Smoker -- 0.10 packs/day    Types: Cigarettes    Last Attempt to Quit: 03/07/2014  . Smokeless tobacco: Never Used     Comment: smokes marajuana at times  . Alcohol Use: Yes     Comment:  ocassionally   OB History    No data available     Review of Systems 10 Systems reviewed and all are negative for acute change except as noted in the HPI.  Allergies  Shellfish-derived products  Home Medications   Prior to Admission medications   Medication Sig Start Date End Date Taking? Authorizing Provider  FLUoxetine (PROZAC) 20 MG capsule Take 1 capsule (20 mg total) by mouth daily. 09/07/15  Yes Vanessa Ralphs, MD  traZODone (DESYREL) 50 MG tablet Take 0.5-1 tablets (25-50 mg total) by mouth at bedtime as needed for sleep. 09/07/15  Yes Vanessa Ralphs, MD  albuterol (PROVENTIL HFA;VENTOLIN HFA) 108 (90 BASE) MCG/ACT inhaler Inhale 2 puffs into the lungs every 6 (six) hours as needed for wheezing or shortness of breath. Patient not taking: Reported on 07/13/2015 09/19/14   Nani Ravens, MD  EPINEPHrine (EPIPEN 2-PAK) 0.3 mg/0.3 mL IJ SOAJ injection Inject 0.3 mLs (0.3 mg total) into the muscle once. Patient not taking: Reported on 07/13/2015 09/30/14   Nani Ravens, MD  ibuprofen (ADVIL,MOTRIN) 800 MG tablet Take 1 tablet (800 mg total) by mouth 3 (three) times daily. Patient not taking: Reported on 07/13/2015 07/10/15   Elpidio Anis, PA-C  ondansetron (ZOFRAN) 4 MG tablet Take 1 tablet (4 mg total) by mouth  at bedtime as needed for nausea or vomiting. Patient not taking: Reported on 09/24/2015 09/07/15   Vanessa RalphsBrian H Pitts, MD  sucralfate (CARAFATE) 1 GM/10ML suspension Take 10 mLs (1 g total) by mouth 4 (four) times daily -  with meals and at bedtime. Patient not taking: Reported on 09/24/2015 09/07/15   Vanessa RalphsBrian H Pitts, MD  SUMAtriptan (IMITREX) 50 MG tablet Take 1-2 tablets (50-100 mg total) by mouth once. May repeat same dose in 2 hours if headache persists or recurs (no more doses in 24 hours) Patient not taking: Reported on 09/24/2015 07/13/15   Netta NeatAlexander J Karamalegos, DO   BP 120/68 mmHg  Pulse 66  Temp(Src) 98.3 F (36.8 C) (Oral)  Resp 16  SpO2 99%  LMP 09/22/2015 (Exact  Date) Physical Exam  Constitutional: She is oriented to person, place, and time. She appears well-developed and well-nourished. No distress.  Pt smells of alcohol  HENT:  Head: Normocephalic and atraumatic.  Nose: Nose normal.  Mouth/Throat: Oropharynx is clear and moist. No oropharyngeal exudate.  Eyes: EOM are normal. Pupils are equal, round, and reactive to light. Right conjunctiva is injected. Left conjunctiva is injected. No scleral icterus.  Neck: Normal range of motion. Neck supple. No JVD present. No tracheal deviation present. No thyromegaly present.  Cardiovascular: Normal rate, regular rhythm and normal heart sounds.  Exam reveals no gallop and no friction rub.   No murmur heard. Pulmonary/Chest: Effort normal and breath sounds normal. No respiratory distress. She has no wheezes. She exhibits no tenderness.  Abdominal: Soft. Bowel sounds are normal. She exhibits no distension and no mass. There is no tenderness. There is no rebound and no guarding.  Musculoskeletal: Normal range of motion. She exhibits no edema or tenderness.  Lymphadenopathy:    She has no cervical adenopathy.  Neurological: She is alert and oriented to person, place, and time. No cranial nerve deficit. She exhibits normal muscle tone.  Skin: Skin is warm and dry. No rash noted. No erythema. No pallor.  Psychiatric: She expresses suicidal ideation. She expresses suicidal plans.  Nursing note and vitals reviewed.   ED Course  Procedures (including critical care time) DIAGNOSTIC STUDIES: Oxygen Saturation is 99% on RA, normal by my interpretation.    COORDINATION OF CARE: 1:44 AM-Discussed treatment plan which includes labs and consult with behavioral health with pt at bedside and pt agreed to plan.    Labs Review Labs Reviewed  COMPREHENSIVE METABOLIC PANEL - Abnormal; Notable for the following:    Potassium 3.4 (*)    Glucose, Bld 102 (*)    AST 14 (*)    ALT 13 (*)    All other components within  normal limits  ETHANOL - Abnormal; Notable for the following:    Alcohol, Ethyl (B) 112 (*)    All other components within normal limits  ACETAMINOPHEN LEVEL - Abnormal; Notable for the following:    Acetaminophen (Tylenol), Serum <10 (*)    All other components within normal limits  CBG MONITORING, ED - Abnormal; Notable for the following:    Glucose-Capillary 108 (*)    All other components within normal limits  SALICYLATE LEVEL  CBC  URINE RAPID DRUG SCREEN, HOSP PERFORMED  POC URINE PREG, ED    Imaging Review No results found. I have personally reviewed and evaluated these images and lab results as part of my medical decision-making.   EKG Interpretation   Date/Time:  Sunday September 24 2015 00:59:02 EST Ventricular Rate:  69 PR Interval:  166  QRS Duration: 89 QT Interval:  403 QTC Calculation: 432 R Axis:   88 Text Interpretation:  Sinus rhythm ST elev, probable normal early repol  pattern No significant change since last tracing Confirmed by Erroll Luna [phone]) on 09/24/2015 1:38:08 AM      MDM   Final diagnoses:  None   Patient presents to the emergency department after a suicide attempt. Patient admits to drinking alcohol and taking trazodone tablets. Laboratory studies are unremarkable other than alcohol level of 112. Patient is medically cleared and will need TTS evaluation. Patient was IVC'ed prior to arrival.  Patient will be kept for psych eval in the morning.   ,I personally performed the services described in this documentation, which was scribed in my presence. The recorded information has been reviewed and is accurate.     Tomasita Crumble, MD 09/24/15 [phone]

## 2015-09-24 NOTE — ED Notes (Signed)
Pt wanded by security. 

## 2015-09-24 NOTE — ED Notes (Addendum)
Pt's mother Mia SickleMalinda Johnson [phone]) called and reports that last week the patient told her sister that she was hearing voices.  Mom reports that there a family hx of schizophrenia.  Mom also reports that last night the patient told her several times that she took the pills becuasae she wanted to die.  Will relay information to Dr Oneita HurtA/Josephine.

## 2015-09-24 NOTE — ED Notes (Signed)
Dr A and Jamison DNP into see 

## 2015-09-24 NOTE — ED Notes (Signed)
On the phone 

## 2015-09-24 NOTE — BH Assessment (Signed)
Assessment completed. Consulted Maryjean Mornharles Kober, PA-C who recommended that pt be evaluated by psychiatry in the morning. Informed Dr. Mora Bellmanni of the recommendation.

## 2015-09-24 NOTE — BH Assessment (Signed)
Patient has been accepted to [facility] per Simonne ComeLeo, CSW, Dr. Jama Flavorsobos accepting. Patient can transfer after 1200. Informed psychiatrist and patients nurse. Patient first opinion being completed and patients nurse will be notified.  Call nurse report to [phone].  Davina PokeJoVea Jeiden Daughtridge, LCSW Therapeutic Triage Specialist [facility] 09/24/2015 10:33 AM

## 2015-09-24 NOTE — ED Notes (Signed)
MD at bedside. 

## 2015-09-24 NOTE — Tx Team (Signed)
Initial Interdisciplinary Treatment Plan   PATIENT STRESSORS: Educational concerns Financial difficulties Marital or family conflict Substance abuse Traumatic event   PATIENT STRENGTHS: Ability for insight Active sense of humor Average or above average intelligence Capable of independent living Communication skills   PROBLEM LIST: Problem List/Patient Goals Date to be addressed Date deferred Reason deferred Estimated date of resolution  Depression s/p OD with SI 09/23/2105     Substance Abuse 09/23/2105     Cystolic Vomiting Syndrome 09/24/2015                 " Im a good person" 09/24/2015     " Im not suicidal now" 09/24/2015                  DISCHARGE CRITERIA:  Ability to meet basic life and health needs Adequate post-discharge living arrangements Improved stabilization in mood, thinking, and/or behavior Medical problems require only outpatient monitoring Motivation to continue treatment in a less acute level of care  PRELIMINARY DISCHARGE PLAN: Attend aftercare/continuing care group Attend PHP/IOP Attend 12-step recovery group Outpatient therapy  PATIENT/FAMIILY INVOLVEMENT: This treatment plan has been presented to and reviewed with the patient, Samara SnideSamantha Dupuis, and/or family member,  The patient and family have been given the opportunity to ask questions and make suggestions.  Rich BraveDuke, Arantza Darrington Lynn 09/24/2015, 5:23 PM

## 2015-09-24 NOTE — ED Notes (Signed)
Poison control called to check on pt. Per poison control, continue to monitor pt, they will call to recheck

## 2015-09-24 NOTE — ED Notes (Signed)
[facility] 1200[facility]
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
## 2015-09-24 NOTE — Consult Note (Signed)
[place] Consult   Reason for Consult:  Overdsoe Referring Physician:  EDP Patient Identification: Lorian Yaun MRN:  [id number] Principal Diagnosis: Severe recurrent major depression without psychotic features [facility]) Diagnosis:   Patient Active Problem List   Diagnosis Date Noted  . Severe recurrent major depression without psychotic features ([place]) [F33.2] 09/24/2015    Priority: High  . Overdose [T50.901A] 09/24/2015    Priority: High  . Alcohol abuse [F10.10] 09/24/2015    Priority: High  . Tonsil stone [J35.8] 09/12/2015  . Vaginal discharge [N89.8] 08/10/2015  . Recurrent abdominal pain [R10.9] 12/22/2014  . Right ovarian cyst [N83.201] 11/10/2014  . UTI (urinary tract infection) [N39.0] 11/10/2014  . Thumb pain [M79.646] 10/12/2014  . Chest pain [R07.9] 10/12/2014  . Dysuria [R30.0] 08/12/2014  . Rotator cuff tendonitis [M75.80] 08/16/2013  . ETOH abuse [F10.10] 04/07/2013  . PTSD (post-traumatic stress disorder) [F43.10] 09/17/2012  . Insomnia [G47.00] 07/17/2012  . Adolescent problems [Z03.89] 03/19/2012  . Nonallopathic lesion of lumbosacral region [M99.03] 11/15/2011  . Adolescent depression [F32.9] 09/18/2011  . Genu valgus, congenital [Q74.1] 08/28/2011  . Cyclic vomiting syndrome [G43.A0] 12/26/2010  . Migraine [G43.909] 10/09/2010  . PES PLANUS [M21.40] 04/25/2010  . IRREGULAR MENSES [N92.6] 03/06/2010  . UNEQUAL LEG LENGTH [M21.70] 07/13/2009  . FOOT DEFORMITY, CONGENITAL [Q66.89] 05/11/2007  . Allergic rhinitis [J30.9] 01/01/2007    Total Time spent with patient: 45 minutes  Subjective:   Jeniah Kishi is a 19 y.o. female patient admitted with intentional overdose.  HPI:  On admission:  19 y.o. female presenting to [facility] after an ingesting a handful of Trazadone and drinking alcohol. Pt denies SI at this time but stated "I got into an argument with my sister and I took some pills". "I wanted everything to stop". "I just  wanted to be left alone". Pt reported that she was arguing with her sister because her sister called her father a crackhead. Pt did not report any previous suicide attempts or self-injurious behaviors but shared that she has had suicidal thoughts in the past. Pt denies HI and AVH at this time. Pt did not report any current mental health treatment but reported that she saw a therapist in the past for depression. Pt is prescribed Prozac and reported that she has not taken her medication in approximately 3 days. Pt shared that she is dealing with multiple stressors and is endorsing multiple depressive symptoms. Pt did not report any illicit substance abuse but shared that she drinks alcohol on the weekends. PT did not report any pending criminal charges or upcoming court dates. Pt denied having access to weapons or firearms. Pt did not report any physical, sexual or emotional abuse at this time.   Today:  Patient is drowsy but admits to overdosing to end her life.  She cannot say how much she drinks on a regular basis, "I don't know."  Minimal information forwarded.  Past Psychiatric History: depression, alcohol abuse  Risk to Self: Suicidal Ideation: Yes-Currently Present (Pt denies but admitted taking pills and drinking alcohol. ) Suicidal Intent: Yes-Currently Present Is patient at risk for suicide?: Yes Suicidal Plan?: Yes-Currently Present Specify Current Suicidal Plan: Pt denies but admitted to taking pills and drinking alcohol.  Access to Means: Yes Specify Access to Suicidal Means: Pt has access to alcohol and pills.  What has been your use of drugs/alcohol within the last 12 months?: Pt reported that she drinks alcohol on the weekend. Denies illicit substance use.  How many times?: 0 Other  Self Harm Risks: No other self harm risk identified at this time.  Triggers for Past Attempts: None known Intentional Self Injurious Behavior: None Risk to Others: Homicidal Ideation: No Thoughts of Harm  to Others: No Current Homicidal Intent: No Current Homicidal Plan: No Access to Homicidal Means: No Identified Victim: N/A History of harm to others?: No Assessment of Violence: On admission Violent Behavior Description: No violent behaviors observed. Pt is calm and cooperative at this time.  Does patient have access to weapons?: No Criminal Charges Pending?: No Does patient have a court date: No Prior Inpatient Therapy: Prior Inpatient Therapy: No Prior Outpatient Therapy: Prior Outpatient Therapy: Yes Prior Therapy Dates: 2013 Prior Therapy Facilty/Provider(s): [facility]"  Reason for Treatment: Depression  Does patient have an ACCT team?: No Does patient have Intensive In-House Services?  : No Does patient have [facility] services? : No Does patient have P4CC services?: No  Past Medical History:  Past Medical History  Diagnosis Date  . Asthma, moderate persistent   . Pes planus (flat feet)   . Environmental allergies   . Marijuana smoker ([place])   . Abdominal pain   . Depression   . PTSD (post-traumatic stress disorder)   . Ovarian cyst     Past Surgical History  Procedure Laterality Date  . Nose surgery     Family History:  Family History  Problem Relation Age of Onset  . Obesity Mother    Family Psychiatric  History: substance abuse Social History:  History  Alcohol Use  . Yes    Comment: ocassionally     History  Drug Use No    Social History   Social History  . Marital Status: Single    Spouse Name: N/A  . Number of Children: N/A  . Years of Education: N/A   Social History Main Topics  . Smoking status: Light Tobacco Smoker -- 0.10 packs/day    Types: Cigarettes    Last Attempt to Quit: 03/07/2014  . Smokeless tobacco: Never Used     Comment: smokes marajuana at times  . Alcohol Use: Yes     Comment: ocassionally  . Drug Use: No  . Sexual Activity: Yes    Birth Control/ Protection: None   Other Topics Concern  . None   Social History  Narrative   Patient lives at home with her mother who is very involved in patient's health care at school and social life. Patient does have a history of depression has been treated with Zoloft somewhat which is improved some but still having multiple medical complaints without any physical findings.   Patient identifies herself as a lesbian and does have an partner who lives with her as well as her mother.   Additional Social History:    History of alcohol / drug use?: Yes Name of Substance 1: Alcohol  1 - Age of First Use: 16 1 - Amount (size/oz): varies  1 - Frequency: weekends  1 - Duration: ongoing  1 - Last Use / Amount: 09-24-15 "1/2 Four loco"                    Allergies:   Allergies  Allergen Reactions  . Shellfish-Derived Products Nausea Only and Other (See Comments)    Positive allergy test    Labs:  Results for orders placed or performed during the hospital encounter of 09/24/15 (from the past 48 hour(s))  Comprehensive metabolic panel     Status: Abnormal   Collection Time: 09/24/15  1:02 AM  Result Value Ref Range   Sodium 141 135 - 145 mmol/L   Potassium 3.4 (L) 3.5 - 5.1 mmol/L   Chloride 110 101 - 111 mmol/L   CO2 24 22 - 32 mmol/L   Glucose, Bld 102 (H) 65 - 99 mg/dL   BUN 11 6 - 20 mg/dL   Creatinine, Ser 0.85 0.44 - 1.00 mg/dL   Calcium 9.2 8.9 - 10.3 mg/dL   Total Protein 6.9 6.5 - 8.1 g/dL   Albumin 4.1 3.5 - 5.0 g/dL   AST 14 (L) 15 - 41 U/L   ALT 13 (L) 14 - 54 U/L   Alkaline Phosphatase 55 38 - 126 U/L   Total Bilirubin 0.3 0.3 - 1.2 mg/dL   GFR calc non Af Amer >60 >60 mL/min   GFR calc Af Amer >60 >60 mL/min    Comment: (NOTE) The eGFR has been calculated using the CKD EPI equation. This calculation has not been validated in all clinical situations. eGFR's persistently <60 mL/min signify possible Chronic Kidney Disease.    Anion gap 7 5 - 15  Ethanol (ETOH)     Status: Abnormal   Collection Time: 09/24/15  1:02 AM  Result Value  Ref Range   Alcohol, Ethyl (B) 112 (H) <5 mg/dL    Comment:        LOWEST DETECTABLE LIMIT FOR SERUM ALCOHOL IS 5 mg/dL FOR MEDICAL PURPOSES ONLY   Salicylate level     Status: None   Collection Time: 09/24/15  1:02 AM  Result Value Ref Range   Salicylate Lvl <0.7 2.8 - 30.0 mg/dL  Acetaminophen level     Status: Abnormal   Collection Time: 09/24/15  1:02 AM  Result Value Ref Range   Acetaminophen (Tylenol), Serum <10 (L) 10 - 30 ug/mL    Comment:        THERAPEUTIC CONCENTRATIONS VARY SIGNIFICANTLY. A RANGE OF 10-30 ug/mL MAY BE AN EFFECTIVE CONCENTRATION FOR MANY PATIENTS. HOWEVER, SOME ARE BEST TREATED AT CONCENTRATIONS OUTSIDE THIS RANGE. ACETAMINOPHEN CONCENTRATIONS >150 ug/mL AT 4 HOURS AFTER INGESTION AND >50 ug/mL AT 12 HOURS AFTER INGESTION ARE OFTEN ASSOCIATED WITH TOXIC REACTIONS.   CBC     Status: None   Collection Time: 09/24/15  1:02 AM  Result Value Ref Range   WBC 5.1 4.0 - 10.5 K/uL   RBC 4.55 3.87 - 5.11 MIL/uL   Hemoglobin 12.8 12.0 - 15.0 g/dL   HCT 38.3 36.0 - 46.0 %   MCV 84.2 78.0 - 100.0 fL   MCH 28.1 26.0 - 34.0 pg   MCHC 33.4 30.0 - 36.0 g/dL   RDW 12.9 11.5 - 15.5 %   Platelets 251 150 - 400 K/uL  CBG monitoring, ED     Status: Abnormal   Collection Time: 09/24/15  1:06 AM  Result Value Ref Range   Glucose-Capillary 108 (H) 65 - 99 mg/dL  Urine rapid drug screen (hosp performed) (Not at [facility])     Status: None   Collection Time: 09/24/15  1:11 AM  Result Value Ref Range   Opiates NONE DETECTED NONE DETECTED   Cocaine NONE DETECTED NONE DETECTED   Benzodiazepines NONE DETECTED NONE DETECTED   Amphetamines NONE DETECTED NONE DETECTED   Tetrahydrocannabinol NONE DETECTED NONE DETECTED   Barbiturates NONE DETECTED NONE DETECTED    Comment:        DRUG SCREEN FOR MEDICAL PURPOSES ONLY.  IF CONFIRMATION IS NEEDED FOR ANY PURPOSE, NOTIFY LAB WITHIN 5 DAYS.  LOWEST DETECTABLE LIMITS FOR URINE DRUG SCREEN Drug Class       Cutoff  (ng/mL) Amphetamine      1000 Barbiturate      200 Benzodiazepine   286 Tricyclics       381 Opiates          300 Cocaine          300 THC              50   POC urine preg, ED (not at [facility])     Status: None   Collection Time: 09/24/15  1:18 AM  Result Value Ref Range   Preg Test, Ur NEGATIVE NEGATIVE    Comment:        THE SENSITIVITY OF THIS METHODOLOGY IS >24 mIU/mL     No current facility-administered medications for this encounter.   Current Outpatient Prescriptions  Medication Sig Dispense Refill  . FLUoxetine (PROZAC) 20 MG capsule Take 1 capsule (20 mg total) by mouth daily. 31 capsule 1  . traZODone (DESYREL) 50 MG tablet Take 0.5-1 tablets (25-50 mg total) by mouth at bedtime as needed for sleep. 31 tablet 1  . albuterol (PROVENTIL HFA;VENTOLIN HFA) 108 (90 BASE) MCG/ACT inhaler Inhale 2 puffs into the lungs every 6 (six) hours as needed for wheezing or shortness of breath. (Patient not taking: Reported on 07/13/2015) 1 Inhaler 0  . EPINEPHrine (EPIPEN 2-PAK) 0.3 mg/0.3 mL IJ SOAJ injection Inject 0.3 mLs (0.3 mg total) into the muscle once. (Patient not taking: Reported on 07/13/2015) 1 Device 1  . ibuprofen (ADVIL,MOTRIN) 800 MG tablet Take 1 tablet (800 mg total) by mouth 3 (three) times daily. (Patient not taking: Reported on 07/13/2015) 21 tablet 0  . ondansetron (ZOFRAN) 4 MG tablet Take 1 tablet (4 mg total) by mouth at bedtime as needed for nausea or vomiting. (Patient not taking: Reported on 09/24/2015) 31 tablet 1  . sucralfate (CARAFATE) 1 GM/10ML suspension Take 10 mLs (1 g total) by mouth 4 (four) times daily -  with meals and at bedtime. (Patient not taking: Reported on 09/24/2015) 420 mL 3  . SUMAtriptan (IMITREX) 50 MG tablet Take 1-2 tablets (50-100 mg total) by mouth once. May repeat same dose in 2 hours if headache persists or recurs (no more doses in 24 hours) (Patient not taking: Reported on 09/24/2015) 10 tablet 0    Musculoskeletal: Strength & Muscle Tone:  within normal limits Gait & Station: normal Patient leans: N/A  Psychiatric Specialty Exam: Review of Systems  Constitutional: Negative.   HENT: Negative.   Eyes: Negative.   Respiratory: Negative.   Cardiovascular: Negative.   Gastrointestinal: Negative.   Genitourinary: Negative.   Musculoskeletal: Negative.   Skin: Negative.   Neurological: Negative.   Endo/Heme/Allergies: Negative.   Psychiatric/Behavioral: Positive for depression, suicidal ideas and substance abuse.    Blood pressure 100/59, pulse 76, temperature 98.3 F (36.8 C), temperature source Oral, resp. rate 17, last menstrual period 09/22/2015, SpO2 98 %.There is no weight on file to calculate BMI.  General Appearance: Disheveled  Eye Sport and exercise psychologist::  Fair  Speech:  Normal Rate  Volume:  Decreased  Mood:  Depressed  Affect:  Congruent  Thought Process:  Coherent but drowsy  Orientation:  Full (Time, Place, and Person)  Thought Content:  Rumination  Suicidal Thoughts:  Yes.  with intent/plan  Homicidal Thoughts:  No  Memory:  Immediate;   Fair Recent;   Fair Remote;   Fair  Judgement:  Impaired  Insight:  Fair  Psychomotor Activity:  Decreased  Concentration:  Fair  Recall:  [employer] of Knowledge:Fair  Language: Good  Akathisia:  No  Handed:  Right  AIMS (if indicated):     Assets:  Leisure Time Physical Health Resilience Social Support  ADL's:  Intact  Cognition: WNL  Sleep:      Treatment Plan Summary: Daily contact with patient to assess and evaluate symptoms and progress in treatment, Medication management and Plan major depression, recurrent, severe without psychotic features: -Crisis stabilization -Medication management:  Alcohol ativan protocol in place, Prozac 10 mg daily for depression started -Individual and substance abuse counseling  Disposition: Recommend psychiatric Inpatient admission when medically cleared.  Waylan Boga, [place] 09/24/2015 9:46 AM  Patient seen face-to-face for  psychiatric evaluation, chart reviewed and case discussed with the physician extender and developed treatment plan. Reviewed the information documented and agree with the treatment plan. Corena Pilgrim, MD

## 2015-09-24 NOTE — Progress Notes (Signed)
Adult Psychoeducational Group Note  Date:  09/24/2015 Time: 8:25 PM  Group Topic/Focus:  Wrap-Up Group:   The focus of this group is to help patients review their daily goal of treatment and discuss progress on daily workbooks.  Participation Level:  Minimal  Participation Quality:  Appropriate  Affect:  Appropriate  Cognitive:  Appropriate  Insight: Appropriate  Engagement in Group:  Limited  Modes of Intervention:  Discussion  Additional Comments:  Pt rated overall day a 7 out of 10 because "it was all right" and she got to sleep in. Pt reported that she did not have a goal for the day. Pt noted that her family coming to see her was the highlight of the day.   Mia NeerJasmine S Julyan Gales 09/24/2015, 9:01 PM

## 2015-09-24 NOTE — ED Notes (Signed)
Pt's mother in w/ pt.  Pt's admission discussed w/ mom and pt w/ pt's permission.

## 2015-09-24 NOTE — ED Notes (Signed)
Pt. To [facility] from ED ambulatory without difficulty, to room 34 . [facility] Pt. Is alert and oriented, warm and dry in no distress. Pt. Denies SI, HI, and AVH. Pt. Calm and cooperative. Pt. Made aware of security cameras and Q15 minute rounds. Pt. Encouraged to let Nursing staff know of any concerns or needs.

## 2015-09-24 NOTE — ED Notes (Signed)
Per EMS pt ingested 4 Locos and Trazodone 50mg  8 tabs around midnight  Pt told family member that she wanted to die  Pt has had SI in the past but this is her first attempt  Pt has not had vomiting since ingestion  Pt is alert to voice  Police with pt upon arrival states a relative has gone to the magistrate to obtain IVC papers

## 2015-09-24 NOTE — ED Notes (Addendum)
All  Belongings given to mother. Pt has a gold set of earrings that were placed in a specimen cup and placed  in bag. Mother also took patient's tongue ring

## 2015-09-24 NOTE — BH Assessment (Addendum)
Tele Assessment Note   Mia Johnson is an 19 y.o. female presenting to [facility] after an ingesting a handful of Trazadone and drinking alcohol. Pt denies SI at this time but stated  "I got into an argument with my sister and I took some pills". "I wanted everything to stop". "I just wanted to be left alone". Pt reported that she was arguing with her sister because her sister called her father a crackhead. Pt did not report any previous suicide attempts or self-injurious behaviors but shared that she has had suicidal thoughts in the past. Pt denies HI and AVH at this time. Pt did not report any current mental health treatment but reported that she saw a therapist in the past for depression. Pt is prescribed Prozac and reported that she has not taken her medication in approximately 3 days.  Pt shared that she is dealing with multiple stressors and is endorsing multiple depressive symptoms. Pt did not report any illicit substance abuse but shared that she drinks alcohol on the weekends. PT did not report any pending criminal charges or upcoming court dates. Pt denied having access to weapons or firearms. Pt did not report any physical, sexual or emotional abuse at this time.  Maryjean Morn, PA-C recommends an am psych eval for final disposition.   Diagnosis: Major Depressive Disorder, Recurrent, Moderate   Past Medical History:  Past Medical History  Diagnosis Date  . Asthma, moderate persistent   . Pes planus (flat feet)   . Environmental allergies   . Marijuana smoker ([facility])   . Abdominal pain   . Depression   . PTSD (post-traumatic stress disorder)   . Ovarian cyst     Past Surgical History  Procedure Laterality Date  . Nose surgery      Family History:  Family History  Problem Relation Age of Onset  . Obesity Mother     Social History:  reports that she has been smoking Cigarettes.  She has been smoking about 0.10 packs per day. She has never used smokeless tobacco. She reports  that she drinks alcohol. She reports that she does not use illicit drugs.  Additional Social History:  Alcohol / Drug Use History of alcohol / drug use?: Yes Substance #1 Name of Substance 1: Alcohol  1 - Age of First Use: 16 1 - Amount (size/oz): varies  1 - Frequency: weekends  1 - Duration: ongoing  1 - Last Use / Amount: 09-24-15 "1/2 Four loco"   CIWA: CIWA-Ar BP: 123/72 mmHg Pulse Rate: 75 COWS:    PATIENT STRENGTHS: (choose at least two) Average or above average intelligence Supportive family/friends  Allergies:  Allergies  Allergen Reactions  . Shellfish-Derived Products Nausea Only and Other (See Comments)    Positive allergy test    Home Medications:  (Not in a hospital admission)  OB/GYN Status:  Patient's last menstrual period was 09/22/2015 (exact date).  General Assessment Data Location of Assessment: [facility] ED TTS Assessment: In system Is this a Tele or Face-to-Face Assessment?: Face-to-Face Is this an Initial Assessment or a Re-assessment for this encounter?: Initial Assessment Marital status: Single Living Arrangements: Other relatives (Older sister ) Can pt return to current living arrangement?: Yes Admission Status: Involuntary Is patient capable of signing voluntary admission?: Yes Referral Source: Self/Family/Friend Insurance type: Medicaid      Crisis Care Plan Living Arrangements: Other relatives (Older sister ) Name of Psychiatrist: No provider reported  Name of Therapist: No provider reported.   Education Status Is patient  currently in school?: No Current Grade: N/A Highest grade of school patient has completed: 67 Name of school: N/A Contact person: N/A  Risk to self with the past 6 months Suicidal Ideation: Yes-Currently Present (Pt denies but admitted taking pills and drinking alcohol. ) Has patient been a risk to self within the past 6 months prior to admission? : No Suicidal Intent: Yes-Currently Present Has patient had any  suicidal intent within the past 6 months prior to admission? : No Is patient at risk for suicide?: Yes Suicidal Plan?: Yes-Currently Present Has patient had any suicidal plan within the past 6 months prior to admission? : No Specify Current Suicidal Plan: Pt denies but admitted to taking pills and drinking alcohol.  Access to Means: Yes Specify Access to Suicidal Means: Pt has access to alcohol and pills.  What has been your use of drugs/alcohol within the last 12 months?: Pt reported that she drinks alcohol on the weekend. Denies illicit substance use.  Previous Attempts/Gestures: No How many times?: 0 Other Self Harm Risks: No other self harm risk identified at this time.  Triggers for Past Attempts: None known Intentional Self Injurious Behavior: None Family Suicide History: No Recent stressful life event(s): Conflict (Comment) (Conflict with sibling. Relationship stressors. ) Persecutory voices/beliefs?: No Depression: Yes Depression Symptoms: Insomnia, Despondent, Tearfulness, Feeling angry/irritable Substance abuse history and/or treatment for substance abuse?: Yes Suicide prevention information given to non-admitted patients: Not applicable  Risk to Others within the past 6 months Homicidal Ideation: No Does patient have any lifetime risk of violence toward others beyond the six months prior to admission? : No Thoughts of Harm to Others: No Current Homicidal Intent: No Current Homicidal Plan: No Access to Homicidal Means: No Identified Victim: N/A History of harm to others?: No Assessment of Violence: On admission Violent Behavior Description: No violent behaviors observed. Pt is calm and cooperative at this time.  Does patient have access to weapons?: No Criminal Charges Pending?: No Does patient have a court date: No Is patient on probation?: No  Psychosis Hallucinations: None noted Delusions: None noted  Mental Status Report Appearance/Hygiene: Unable to  Assess Eye Contact: Good Motor Activity: Freedom of movement Speech: Logical/coherent, Soft Level of Consciousness: Quiet/awake Mood: Euthymic Affect: Appropriate to circumstance Anxiety Level: Minimal Thought Processes: Relevant, Coherent Judgement: Impaired (BAL-112) Orientation: Appropriate for developmental age Obsessive Compulsive Thoughts/Behaviors: None  Cognitive Functioning Concentration: Fair Memory: Recent Intact, Remote Intact IQ: Average Insight: Poor Impulse Control: Poor Appetite: Fair Weight Loss: 0 Weight Gain: 0 Sleep: Decreased Total Hours of Sleep: 5 Vegetative Symptoms: None  ADLScreening [facility] Assessment Services) Patient's cognitive ability adequate to safely complete daily activities?: Yes Patient able to express need for assistance with ADLs?: Yes Independently performs ADLs?: Yes (appropriate for developmental age)  Prior Inpatient Therapy Prior Inpatient Therapy: No  Prior Outpatient Therapy Prior Outpatient Therapy: Yes Prior Therapy Dates: 2013 Prior Therapy Facilty/Provider(s): "[facility]"  Reason for Treatment: Depression  Does patient have an ACCT team?: No Does patient have Intensive In-House Services?  : No Does patient have [facility] services? : No Does patient have P4CC services?: No  ADL Screening (condition at time of admission) Patient's cognitive ability adequate to safely complete daily activities?: Yes Is the patient deaf or have difficulty hearing?: No Does the patient have difficulty seeing, even when wearing glasses/contacts?: No Does the patient have difficulty concentrating, remembering, or making decisions?: No Patient able to express need for assistance with ADLs?: Yes Does the patient have difficulty dressing or bathing?:  No Independently performs ADLs?: Yes (appropriate for developmental age)       Abuse/Neglect Assessment (Assessment to be complete while patient is alone) Physical Abuse: Denies Verbal Abuse:  Denies Sexual Abuse: Denies Exploitation of patient/patient's resources: Denies Self-Neglect: Denies     Merchant navy officerAdvance Directives (For Healthcare) Does patient have an advance directive?: No Would patient like information on creating an advanced directive?: No - patient declined information    Additional Information 1:1 In Past 12 Months?: No CIRT Risk: No Elopement Risk: No Does patient have medical clearance?: Yes     Disposition:  Disposition Initial Assessment Completed for this Encounter: Yes Disposition of Patient: Other dispositions Other disposition(s): Other (Comment) (AM Psych Eval )  Ryson Bacha S 09/24/2015 2:35 AM

## 2015-09-24 NOTE — ED Notes (Signed)
Pt ambulated to restroom. 

## 2015-09-25 ENCOUNTER — Encounter (HOSPITAL_COMMUNITY): Payer: Self-pay | Admitting: Psychiatry

## 2015-09-25 DIAGNOSIS — T1491 Suicide attempt: Secondary | ICD-10-CM

## 2015-09-25 DIAGNOSIS — T43212A Poisoning by selective serotonin and norepinephrine reuptake inhibitors, intentional self-harm, initial encounter: Secondary | ICD-10-CM

## 2015-09-25 MED ORDER — BOOST / RESOURCE BREEZE PO LIQD
1.0000 | Freq: Two times a day (BID) | ORAL | Status: DC
  Administered 2015-09-25 (×2): 1 via ORAL
  Filled 2015-09-25 (×7): qty 1

## 2015-09-25 NOTE — [facility] Suicide Risk Assessment (Signed)
[facility]   Nursing information obtained from:    Demographic factors:    Current Mental Status:    Loss Factors:    Historical Factors:    Risk Reduction Factors:    Total Time spent with patient: 45 minutes Principal Problem: Major depressive disorder, recurrent episode, severe (HCC) Diagnosis:   Patient Active Problem List   Diagnosis Date Noted  . Severe recurrent major depression without psychotic features (HCC) [F33.2] 09/24/2015  . Overdose [T50.901A] 09/24/2015  . Alcohol abuse [F10.10] 09/24/2015  . Major depressive disorder, recurrent episode, severe (HCC) [F33.2] 09/24/2015  . Tonsil stone [J35.8] 09/12/2015  . Vaginal discharge [N89.8] 08/10/2015  . Recurrent abdominal pain [R10.9] 12/22/2014  . Right ovarian cyst [N83.201] 11/10/2014  . UTI (urinary tract infection) [N39.0] 11/10/2014  . Thumb pain [M79.646] 10/12/2014  . Chest pain [R07.9] 10/12/2014  . Dysuria [R30.0] 08/12/2014  . Rotator cuff tendonitis [M75.80] 08/16/2013  . ETOH abuse [F10.10] 04/07/2013  . PTSD (post-traumatic stress disorder) [F43.10] 09/17/2012  . Insomnia [G47.00] 07/17/2012  . Adolescent problems [Z03.89] 03/19/2012  . Nonallopathic lesion of lumbosacral region [M99.03] 11/15/2011  . Adolescent depression [F32.9] 09/18/2011  . Genu valgus, congenital [Q74.1] 08/28/2011  . Cyclic vomiting syndrome [G43.A0] 12/26/2010  . Migraine [G43.909] 10/09/2010  . PES PLANUS [M21.40] 04/25/2010  . IRREGULAR MENSES [N92.6] 03/06/2010  . UNEQUAL LEG LENGTH [M21.70] 07/13/2009  . FOOT DEFORMITY, CONGENITAL [Q66.89] 05/11/2007  . Allergic rhinitis [J30.9] 01/01/2007     Continued Clinical Symptoms:  Alcohol Use Disorder Identification Test Final Score (AUDIT): 2 The "Alcohol Use Disorders Identification Test", Guidelines for Use in Primary Care, Second Edition.  World Science writerHealth Organization [facility](WHO). Score between 0-7:  no or low risk or alcohol related problems. Score  between 8-15:  moderate risk of alcohol related problems. Score between 16-19:  high risk of alcohol related problems. Score 20 or above:  warrants further diagnostic evaluation for alcohol dependence and treatment.   CLINICAL FACTORS:   Depression:   Impulsivity   Psychiatric Specialty Exam: Physical Exam  ROS  Blood pressure 115/50, pulse 89, temperature 97.9 F (36.6 C), temperature source Oral, resp. rate 16, height 5\' 9"  (1.753 m), weight 55.339 kg (122 lb), last menstrual period 09/22/2015, SpO2 100 %.Body mass index is 18.01 kg/(m^2).    COGNITIVE FEATURES THAT CONTRIBUTE TO RISK:  Closed-mindedness, Polarized thinking and Thought constriction (tunnel vision)    SUICIDE RISK:   Mild:  Suicidal ideation of limited frequency, intensity, duration, and specificity.  There are no identifiable plans, no associated intent, mild dysphoria and related symptoms, good self-control (both objective and subjective assessment), few other risk factors, and identifiable protective factors, including available and accessible social support.  PLAN OF CARE: see Admission H and P  Medical Decision Making:  New problem, with additional work up planned, Review of Psycho-Social Stressors (1), Review of Medication Regimen & Side Effects (2) and Review of New Medication or Change in Dosage (2)  I certify that inpatient services furnished can reasonably be expected to improve the patient's condition.   Clark Cuff A 09/25/2015, 8:18 PM
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
## 2015-09-25 NOTE — Progress Notes (Signed)
D. Pt had been up and visible in milieu this evening, did attend evening group activity. Pt spoke of her day and that she was feeling ok. Pt did not require or request any medications this evening and did not verbalize any complaints of pain. A. Support and encouragement provided. R. Safety maintained, will continue to monitor.

## 2015-09-25 NOTE — Progress Notes (Signed)
Adult Psychoeducational Group Note  Date:  09/25/2015 Time:  8:25 PM  Group Topic/Focus:  Wrap-Up Group:   The focus of this group is to help patients review their daily goal of treatment and discuss progress on daily workbooks.  Participation Level:  Active  Participation Quality:  Appropriate  Affect:  Appropriate  Cognitive:  Appropriate  Insight: Good  Engagement in Group:  Engaged  Modes of Intervention:  Education  Additional Comments:  Pt rated overall day a 10 out of 10 because she found out that she is going home tomorrow. Pt reported that she achieved her goal for the day, which was to stay awake and communicate more with other patients on the unit. Pt reported that positive communication with other patients was the highlight of her day.   Mia Johnson 09/25/2015, 8:53 PM

## 2015-09-25 NOTE — Progress Notes (Signed)
D:  Patient's self inventory sheet, patient sleeps good, no sleep medication needed.  Poor appetite, normal energy level, good concentration.  Rated depression 2, denied hopeless and anxiety.  Denied withdrawals.  Denied SI.  Denied physical problems.  Denied physical pain.  Goal is to stay up and work on discharge to be home with family during Thanksgiving.  Plans to stay up.  Has questions for MD.  No discharge plans.  No problems anticipated after discharge. A:  Medications administered per MD orders.  Emotional support and encouragement given patient. R:  Denied SI and HI, contracts for safety.  Denied A/V hallucinations.  Safety maintained with 15 minute checks.

## 2015-09-25 NOTE — [facility] Suicide Risk Assessment (Signed)
[facility] INPATIENT:  Family/Significant Other Suicide Prevention Education  Suicide Prevention Education:  Education Completed; Mia Johnson, Pt's mother [phone],  has been identified by the patient as the family member/significant other with whom the patient will be residing, and identified as the person(s) who will aid the patient in the event of a mental health crisis (suicidal ideations/suicide attempt).  With written consent from the patient, the family member/significant other has been provided the following suicide prevention education, prior to the and/or following the discharge of the patient.  The suicide prevention education provided includes the following:  Suicide risk factors  Suicide prevention and interventions  National Suicide Hotline telephone number  [facility] assessment telephone number  [facility] Emergency Assistance 911  [facility] and/or [facility] telephone number  Request made of family/significant other to:  Remove weapons (e.g., guns, rifles, knives), all items previously/currently identified as safety concern.    Remove drugs/medications (over-the-counter, prescriptions, illicit drugs), all items previously/currently identified as a safety concern.  The family member/significant other verbalizes understanding of the suicide prevention education information provided.  The family member/significant other agrees to remove the items of safety concern listed above.  Mia Johnson, Mia Johnson M 09/25/2015, 3:29 PM

## 2015-09-25 NOTE — [facility] Group Notes (Signed)
[facility]  09/25/2015 8:45 AM  Participation Quality: Alert, Appropriate and Oriented  Mood/Affect: Flat  Depression Rating: 2  Anxiety Rating: 2  Thoughts of Suicide: Pt denies SI/HI  Will you contract for safety? Yes  Current AVH: Pt denies  Plan for Discharge/Comments: Pt attended discharge planning group and actively participated in group. CSW discussed suicide prevention education with the group and encouraged them to discuss discharge planning and any relevant barriers. Pt requests referral for services before discharging home. No needs expressed; is minimal in group.  Transportation Means: Pt reports access to transportation  Supports: No supports mentioned at this time  Chad CordialLauren Carter, LCSWA 09/25/2015 9:16 AM

## 2015-09-25 NOTE — [facility] Group Notes (Signed)
[facility] LCSW Group Therapy  09/25/2015 1:15pm  Type of Therapy:  Group Therapy vercoming Obstacles  Participation Level:  Minimal  Participation Quality:  Reserved  Affect:  Appropriate  Cognitive:  Appropriate and Oriented  Insight:  Developing/Improving and Improving  Engagement in Therapy:  Improving  Modes of Intervention:  Discussion, Exploration, Problem-solving and Support  Description of Group:   In this group patients will be encouraged to explore what they see as obstacles to their own wellness and recovery. They will be guided to discuss their thoughts, feelings, and behaviors related to these obstacles. The group will process together ways to cope with barriers, with attention given to specific choices patients can make. Each patient will be challenged to identify changes they are motivated to make in order to overcome their obstacles. This group will be process-oriented, with patients participating in exploration of their own experiences as well as giving and receiving support and challenge from other group members.  Summary of Patient Progress: Pt participates when prompted, otherwise reserved. Pt identified her relationship with her father as an obstacle in her life because it causes trust issues and causes her sadness. Pt expressed a desire to be able to have a good relationship with her father.    Therapeutic Modalities:   Cognitive Behavioral Therapy Solution Focused Therapy Motivational Interviewing Relapse Prevention Therapy   Chad CordialLauren Carter, LCSWA 09/25/2015 3:22 PM
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
## 2015-09-25 NOTE — Plan of Care (Signed)
Problem: Consults Goal: Depression Patient Education See Patient Education Module for education specifics.  Outcome: Progressing Nurse discussed depression/coping skills with patient.        

## 2015-09-25 NOTE — Progress Notes (Signed)
NUTRITION ASSESSMENT  Pt identified as at risk on the Malnutrition Screen Tool  INTERVENTION: 1. Educated patient on the importance of nutrition and encouraged intake of food and beverages. 2. Discussed weight goals. 3. Supplements: continue Ensure Enlive BID, each supplement provides 350 kcal and 20 grams of protein Will also order Boost Breeze BID, each supplement provides 250 kcal and 9 grams of protein   NUTRITION DIAGNOSIS: Unintentional weight loss related to sub-optimal intake as evidenced by pt report.   Goal: Pt to meet >/= 90% of their estimated nutrition needs.  Monitor:  PO intake  Assessment:  Pt seen for MST. Pt admitted for SI. Notes indicate pt with hx of stomach ulcer and "cystic vomiting" which is likely entered in error and meant to state "cyclic vomiting syndrome."  Overall, pt's weight has been stable since February 2016 (122-129 lbs). Per review, she has lost 6 lbs (5% body weight) in the past 17 days which is significant for time frame; this was after a weight gain from March to September.  Ensure Shiela Mayernlive has already been ordered. Will also order Boost Breeze in case pt tolerates this better during "on" times of cyclic vomiting.  19 y.o. female  Height: Ht Readings from Last 1 Encounters:  09/24/15 5\' 9"  (1.753 m)    Weight: Wt Readings from Last 1 Encounters:  09/24/15 122 lb (55.339 kg)    Weight Hx: Wt Readings from Last 10 Encounters:  09/24/15 122 lb (55.339 kg)  09/12/15 129 lb 2 oz (58.571 kg)  09/07/15 128 lb (58.06 kg)  08/10/15 128 lb (58.06 kg)  07/13/15 126 lb (57.153 kg)  07/09/15 134 lb (60.782 kg)  01/26/15 124 lb (56.246 kg) (43 %*, Z = -0.18)  01/12/15 124 lb (56.246 kg) (43 %*, Z = -0.18)  12/22/14 124 lb (56.246 kg) (43 %*, Z = -0.17)  12/15/14 125 lb 14.4 oz (57.108 kg) (47 %*, Z = -0.07)   * Growth percentiles are based on CDC 2-20 Years data.    BMI:  Body mass index is 18.01 kg/(m^2). Pt meets criteria for  underweight based on current BMI.  Estimated Nutritional Needs: Kcal: 25-30 kcal/kg Protein: > 1 gram protein/kg Fluid: 1 ml/kcal  Diet Order: Diet Heart Room service appropriate?: Yes; Fluid consistency:: Thin Pt is also offered choice of unit snacks mid-morning and mid-afternoon.  Pt is eating as desired.   Lab results and medications reviewed.       Trenton GammonJessica Orlando Devereux, RD, LDN Inpatient Clinical Dietitian Pager # [phone] After hours/weekend pager # [phone]

## 2015-09-26 DIAGNOSIS — F332 Major depressive disorder, recurrent severe without psychotic features: Principal | ICD-10-CM

## 2015-09-26 MED ORDER — MIRTAZAPINE 15 MG PO TABS
15.0000 mg | ORAL_TABLET | Freq: Every day | ORAL | Status: DC
  Filled 2015-09-26 (×2): qty 1

## 2015-09-26 MED ORDER — PANTOPRAZOLE SODIUM 20 MG PO TBEC
20.0000 mg | DELAYED_RELEASE_TABLET | Freq: Every day | ORAL | Status: DC
  Administered 2015-09-26 – 2015-09-27 (×2): 20 mg via ORAL
  Filled 2015-09-26 (×4): qty 1

## 2015-09-26 NOTE — Progress Notes (Signed)
D. Pt had been up and visible in milieu this evening, attended and participated in evening group activity. Pt seen interacting appropriately with peers. Pt did not verbalize any complaints and spoke about how she had a good day today. A. Support provided. R. Safety maintained, will continue to monitor.

## 2015-09-26 NOTE — Progress Notes (Signed)
D: Patient states she had a good day.  Patient states she is supposed to be discharged tomorrow.  Patient states her goal for today was to go home.  Patient states she did not meet her goal because she did not go home.  Patient has had some flirtatious conversation tonight and had to be redirected.  Patient denies SI/HI and denies AVH. A: Staff to monitor Q 15 mins for safety.  Encouragement and support offered. No scheduled medications administered per orders.   R: Patient remains safe on the unit.  Patient attended group tonight.  Patient visible on the unit and interacting with peers.  Patient refused scheduled medications.

## 2015-09-26 NOTE — Progress Notes (Signed)
[facility] MD Progress Note  09/26/2015 11:12 AM Mia Johnson  MRN:  [id number] Subjective: Patient states she is feeling "OK" . Denies medication side effects. Objective : I have discussed case with treatment team and have met with patient. Patient is a 19 year Johnson , Mia Johnson, Mia Johnson the context of severe depressive episode. States she was doing well until day of admission and that she felt acutely upset after finding out that her father had relapsed on drugs. Other stressors identified by patient include  Stomach ulcers, frequent vomiting. These symptoms are chronic, Mia new , and states she has been diagnosed with " Cyclic Vomiting Syndrome". She states that because she has lost weight associated with the above, sometimes people incorrectly assume she is using drugs, which causes her to feel depressed . She states she has Mia abused any drugs Johnson a  " long time". As noted, at this time minimizes depression, states that overdose was Johnson. Denies anhedonia, denies changes Johnson energy, denies any current suicidal ideations. Does report fair sleep, and appetite tends to be fair, related to her chronic GI symptoms. Patient presents with somewhat depressed mood, range of affect, but she denies any significant depression today, and is focused on being discharged soon. At present denies any medication side effects. No disruptive behaviors on unit . Principal Problem: Major depressive disorder, recurrent episode, severe ([place]) Diagnosis:   Patient Active Problem List   Diagnosis Date Noted  . Severe recurrent major depression without psychotic features ([place]) [F33.2] 09/24/2015  . Overdose [T50.901A] 09/24/2015  . Alcohol abuse [F10.10] 09/24/2015  . Major depressive disorder, recurrent episode, severe ([place]) [F33.2] 09/24/2015  . Tonsil stone [J35.8] 09/12/2015  . Vaginal discharge [N89.8] 08/10/2015  . Recurrent  abdominal pain [R10.9] 12/22/2014  . Right ovarian cyst [N83.201] 11/10/2014  . UTI (urinary tract infection) [N39.0] 11/10/2014  . Thumb pain [M79.646] 10/12/2014  . Chest pain [R07.9] 10/12/2014  . Dysuria [R30.0] 08/12/2014  . Rotator cuff tendonitis [M75.80] 08/16/2013  . ETOH abuse [F10.10] 04/07/2013  . PTSD (post-traumatic stress disorder) [F43.10] 09/17/2012  . Insomnia [G47.00] 07/17/2012  . Adolescent problems [Z03.89] 03/19/2012  . Nonallopathic lesion of lumbosacral region [M99.03] 11/15/2011  . Adolescent depression [F32.9] 09/18/2011  . Genu valgus, congenital [Q74.1] 08/28/2011  . Cyclic vomiting syndrome [G43.A0] 12/26/2010  . Migraine [G43.909] 10/09/2010  . PES PLANUS [M21.40] 04/25/2010  . IRREGULAR MENSES [N92.6] 03/06/2010  . UNEQUAL LEG LENGTH [M21.70] 07/13/2009  . FOOT DEFORMITY, CONGENITAL [Q66.89] 05/11/2007  . Allergic rhinitis [J30.9] 01/01/2007   Total Time spent with patient: 25 minutes     Past Medical History:  Past Medical History  Diagnosis Date  . Asthma, moderate persistent   . Pes planus (flat feet)   . Environmental allergies   . Marijuana smoker ([place])   . Abdominal pain   . Depression   . PTSD (post-traumatic stress disorder)   . Ovarian cyst     Past Surgical History  Procedure Laterality Date  . Nose surgery     Family History:  Family History  Problem Relation Age of Onset  . Obesity Mother     Social History:  History  Alcohol Use  . Yes    Comment: ocassionally     History  Drug Use No    Social History   Social History  . Marital Status: Single    Spouse Name: N/A  . Number of Children:  N/A  . Years of Education: N/A   Social History Main Topics  . Smoking status: Light Tobacco Smoker -- 0.10 packs/day    Types: Cigarettes    Last Attempt to Quit: 03/07/2014  . Smokeless tobacco: Never Used     Comment: smokes marajuana at times  . Alcohol Use: Yes     Comment: ocassionally  . Drug Use: No  . Sexual  Activity: Yes    Birth Control/ Protection: None   Other Topics Concern  . None   Social History Narrative   Patient lives at home with her mother who is very involved Johnson patient's health care at school and social life. Patient does have a history of depression has been treated with Zoloft somewhat which is improved some but still having multiple medical complaints without any physical findings.   Patient identifies herself as a lesbian and does have an partner who lives with her as well as her mother.   Additional Social History:    Pain Medications: n/a History of alcohol / drug use?: Yes Longest period of sobriety (when/how long): 6 months Negative Consequences of Use: Financial, Legal, Personal relationships Withdrawal Symptoms: Other (Comment) (N/A) Name of Substance 1: alcohol 1 - Age of First Use: 19yo  Sleep: Fair  Appetite:  Fair  Current Medications: Current Facility-Administered Medications  Medication Dose Route Frequency Provider Last Rate Last Dose  . acetaminophen (TYLENOL) tablet 650 mg  650 mg Oral Q6H PRN Patrecia Pour, NP      . alum & mag hydroxide-simeth (MAALOX/MYLANTA) 200-200-20 MG/5ML suspension 30 mL  30 mL Oral Q4H PRN Patrecia Pour, NP      . feeding supplement (BOOST / RESOURCE BREEZE) liquid 1 Container  1 Container Oral BID BM Rosezetta Schlatter, RD   1 Container at 09/25/15 1500  . feeding supplement (ENSURE ENLIVE) (ENSURE ENLIVE) liquid 237 mL  237 mL Oral BID BM Myer Peer Cobos, MD   237 mL at 09/25/15 1502  . FLUoxetine (PROZAC) capsule 10 mg  10 mg Oral Daily Patrecia Pour, NP   10 mg at 09/26/15 5681  . hydrOXYzine (ATARAX/VISTARIL) tablet 25 mg  25 mg Oral TID PRN Patrecia Pour, NP      . magnesium hydroxide (MILK OF MAGNESIA) suspension 30 mL  30 mL Oral Daily PRN Patrecia Pour, NP      . mirtazapine (REMERON) tablet 15 mg  15 mg Oral QHS Fernando A Cobos, MD      . pantoprazole (PROTONIX) EC tablet 20 mg  20 mg Oral Daily Jenne Campus, MD        Lab Results: No results found for this or any previous visit (from the past 48 hour(s)).  Physical Findings: AIMS: Facial and Oral Movements Muscles of Facial Expression: None, normal Lips and Perioral Area: None, normal Jaw: None, normal Tongue: None, normal,Extremity Movements Upper (arms, wrists, hands, fingers): None, normal Lower (legs, knees, ankles, toes): None, normal, Trunk Movements Neck, shoulders, hips: None, normal, Overall Severity Severity of abnormal movements (highest score from questions above): None, normal Incapacitation due to abnormal movements: None, normal Patient's awareness of abnormal movements (rate only patient's report): No Awareness, Dental Status Current problems with teeth and/or dentures?: No Does patient usually wear dentures?: No  CIWA:  CIWA-Ar Total: 0 COWS:  COWS Total Score: 3  Musculoskeletal: Strength & Muscle Tone: within normal limits Gait & Station: normal Patient leans: N/A  Psychiatric Specialty Exam: ROS no headache, chronic  episodes of vomiting, (+) weight loss. Denies melenas , denies hematemesis  Blood pressure 115/50, pulse 89, temperature 97.9 F (36.6 C), temperature source Oral, resp. rate 16, height '5\' 9"'  (1.753 m), weight 122 lb (55.339 kg), last menstrual period 09/22/2015, SpO2 100 %.Body mass index is 18.01 kg/(m^2).  General Appearance: Fairly Groomed  Engineer, water::  Good  Speech:  Normal Rate  Volume:  Decreased  Mood:  states mood is "OK", minimizes depression at this time  Affect:  tends to present subdued , constricted, but at times smiles briefly  Thought Process:  Linear  Orientation:  Other:  fully alert and attentive   Thought Content:  denies hallucinations, no delusions expressed, Mia internally proeccupied   Suicidal Thoughts:  No- at this time denies any suicidal ideations, denies any self injurious ideations  Homicidal Thoughts:  No- denies any violent or homicidal ideations  Memory:   recent and remote grossly intact   Judgement:  Fair  Insight:  Fair  Psychomotor Activity:  Normal  Concentration:  Good  Recall:  Good  Fund of Knowledge:Good  Language: Good  Akathisia:  Negative  Handed:  Right  AIMS (if indicated):     Assets:  Communication Skills Desire for Improvement Resilience  ADL's:  Intact  Cognition: WNL  Sleep:  Number of Hours: 4.25  Assessment - at this time patient minimizes ongoing depression and reports her mood is better . Minimizes current symptoms of depression. However , affect does appear somewhat constricted , sad. Denies any SI and there are no psychotic symptoms. Has long history of GI symptoms, mainly vomiting, and has lost weight related to this issue. Currently on Prozac trial. Thus far no side effects reported . Due to  Poor sleep, poor appetite, and ongoing depression we discussed medication option such as REMERON. She agrees to trial .  Treatment Plan Summary: Daily contact with patient to assess and evaluate symptoms and progress Johnson treatment, Medication management, Plan inpatient admission and medications as below  Encourage group, milieu participation to work on coping skills , symptom reduction Continue Prozac 10 mgrs QDAY for depression Start Remeron  15 mgrs QHS to help with depression management, insomnia. May also help improve appetite. Start PPI such as Protonix for her history of  GI symptoms, stomach ulcers as per her report . Continue Vistaril 25 mgrs Q 8 hours PRN for anxiety, as needed  COBOS, FERNANDO 09/26/2015, 11:12 AM

## 2015-09-26 NOTE — [facility] Group Notes (Signed)
[facility] LCSW Group Therapy 09/26/2015 1:15 PM  Type of Therapy: Group Therapy- Feelings about Diagnosis  Participation Level: Active   Participation Quality:  Appropriate  Affect:  Appropriate  Cognitive: Alert and Oriented   Insight:  Developing   Engagement in Therapy: Developing/Improving and Engaged   Modes of Intervention: Clarification, Confrontation, Discussion, Education, Exploration, Limit-setting, Orientation, Problem-solving, Rapport Building, Dance movement psychotherapisteality Testing, Socialization and Support  Description of Group:   This group will allow patients to explore their thoughts and feelings about diagnoses they have received. Patients will be guided to explore their level of understanding and acceptance of these diagnoses. Facilitator will encourage patients to process their thoughts and feelings about the reactions of others to their diagnosis, and will guide patients in identifying ways to discuss their diagnosis with significant others in their lives. This group will be process-oriented, with patients participating in exploration of their own experiences as well as giving and receiving support and challenge from other group members.  Summary of Progress/Problems:  Pt expressed that she does not like the cumbersome treatment that comes with keeping mental illness stabilized. Pt also expressed that she is unfairly treated in the work force due to her anxiety. Pt interacted well with peers and was receptive to feedback.  Therapeutic Modalities:   Cognitive Behavioral Therapy Solution Focused Therapy Motivational Interviewing Relapse Prevention Therapy  Chad CordialLauren Carter, LCSWA 09/26/2015 4:28 PM

## 2015-09-26 NOTE — Progress Notes (Signed)
Adult Psychoeducational Group Note  Date:  09/26/2015 Time:  9:23 PM  Group Topic/Focus:  Wrap-Up Group:   The focus of this group is to help patients review their daily goal of treatment and discuss progress on daily workbooks.  Participation Level:  Active  Participation Quality:  Appropriate  Affect:  Appropriate  Cognitive:  Alert  Insight: Appropriate  Engagement in Group:  Engaged  Modes of Intervention:  Discussion  Additional Comments:  Patient stated having a good day. Patient stated something positive is being able to go home tomorrow. Patient's goal was to prepare for discharge.   Arvle Grabe L Nicco Reaume 09/26/2015, 9:23 PM

## 2015-09-26 NOTE — Progress Notes (Signed)
Recreation Therapy Notes  Animal-Assisted Activity (AAA) Program Checklist/Progress Notes Patient Eligibility Criteria Checklist & Daily Group note for Rec Tx Intervention  Date: 11.22.2016 Time: 2:45pm [facility] [address]   AAA/T Program Assumption of Risk Form signed by Patient/ or Parent Legal Guardian yes  Patient is free of allergies or sever asthma yes  Patient reports no fear of animals yes  Patient reports no history of cruelty to animals yes  Patient understands his/her participation is voluntary yes  Patient washes hands before animal contact yes  Patient washes hands after animal contact yes  Behavioral Response: Appropriate  Education: Hand Washing, Appropriate Animal Interaction   Education Outcome: Acknowledges education.   Clinical Observations/Feedback: Patient engaged appropriately with peers and therapy dog during session.   Marykay Lexenise L Hassaan Crite, LRT/CTRS  Jearl KlinefelterBlanchfield, Cristalle Rohm L 09/26/2015 3:25 PM

## 2015-09-26 NOTE — Progress Notes (Signed)
Patient ID: Samara SnideSamantha Bryars, female   DOB: 12/22/1995, 19 y.o.   MRN: [id number]  Adult Psychoeducational Group Note  Date:  09/26/2015 Time:  09:15am  Group Topic/Focus:  Recovery Goals:   The focus of this group is to identify appropriate goals for recovery and establish a plan to achieve them.  Participation Level:  Active  Participation Quality:  Appropriate  Affect:  Flat  Cognitive:  Appropriate  Insight: Good  Engagement in Group:  Improving  Modes of Intervention:  Activity, Discussion, Orientation and Support  Additional Comments:  Pt able to identify aspects of personal recovery plan.   Aurora Maskwyman, Maxie Slovacek E 09/26/2015, 10:22 AM

## 2015-09-26 NOTE — Progress Notes (Signed)
Patient ID: Mia SnideSamantha Johnson, female   DOB: 06/01/1996, 19 y.o.   MRN: [id number]  Pt currently presents with a  flat affect and guarded behavior. Per self inventory, pt rates depression, hopelessness and anxiety at a 0. Pt's daily goal is "going home" and they intend to do so by "talk to the doctor." Pt reports good sleep, a fair appetite, normal energy and good concentration. Pt forwards little to Clinical research associatewriter. Pt interacts closely with roommate throughout the day.  Pt provided with medications per providers orders. Pt's labs and vitals were monitored throughout the day. Pt supported emotionally and encouraged to express concerns and questions. Pt educated on medications.  Pt's safety ensured with 15 minute and environmental checks. Pt currently denies SI/HI and A/V hallucinations. Pt verbally agrees to seek staff if SI/HI or A/VH occurs and to consult with staff before acting on these thoughts. Will continue POC.

## 2015-09-27 DIAGNOSIS — F332 Major depressive disorder, recurrent severe without psychotic features: Secondary | ICD-10-CM | POA: Insufficient documentation

## 2015-09-27 MED ORDER — EPINEPHRINE 0.3 MG/0.3ML IJ SOAJ: 0.3000 mg | Freq: Once | INTRAMUSCULAR | Status: DC

## 2015-09-27 MED ORDER — HYDROXYZINE HCL 25 MG PO TABS: 25.0000 mg | ORAL_TABLET | Freq: Three times a day (TID) | ORAL | Status: DC | PRN

## 2015-09-27 MED ORDER — MIRTAZAPINE 15 MG PO TABS: 15.0000 mg | ORAL_TABLET | Freq: Every day | ORAL | Status: DC

## 2015-09-27 MED ORDER — FLUOXETINE HCL 10 MG PO CAPS: 10.0000 mg | ORAL_CAPSULE | Freq: Every day | ORAL | Status: DC

## 2015-09-27 MED ORDER — PANTOPRAZOLE SODIUM 20 MG PO TBEC
20.0000 mg | DELAYED_RELEASE_TABLET | Freq: Every day | ORAL | Status: DC
Start: 2015-09-27 — End: 2016-10-14

## 2015-09-27 NOTE — Progress Notes (Addendum)
Patient ID: Mia SnideSamantha Johnson, female   DOB: 05/20/1996, 19 y.o.   MRN: [id number]  Pt currently presents with a flat affect and guarded behavior. Per self inventory, pt rates depression, hopelessness and anxiety at 0. Pt's daily goal is "going home to my family" and they intend to do so by "do everything to go home." Pt reports good sleep, a good appetite, normal energy and good concentration. Pt attends groups today and interacts positively with peers.   Pt provided with medications per providers orders. Pt's labs and vitals were monitored throughout the day. Pt supported emotionally and encouraged to express concerns and questions. Pt educated on medications and importance of adherence post discharge.  Pt's safety ensured with 15 minute and environmental checks. Pt currently denies SI/HI and A/V hallucinations. Pt verbally agrees to seek staff if SI/HI or A/VH occurs and to consult with staff before acting on these thoughts. Pt to be discharged per MD orders. Pt to follow up with [facility]. Will continue POC.

## 2015-09-27 NOTE — Progress Notes (Signed)
Pt prefers therapy at [facility] and is agreeable to scheduling the appointment when she is discharged.  Chad CordialLauren Carter, LCSWA Clinical Social Work [phone]

## 2015-09-27 NOTE — Progress Notes (Signed)
  [facility] :  Will you be returning to the same living situation after discharge:  Yes,  Pt returning to her sister's home At discharge, do you have transportation home?: Yes,  Pt family to provide transportation Do you have the ability to pay for your medications: Yes,  Pt provided with prescriptions  Release of information consent forms completed and in the chart;  Patient's signature needed at discharge.  Patient to Follow up at: Follow-up Information    Follow up with [facility] On 10/11/2015.   Why:  at 2:00pm for medication management with Dr. Jeronimo Normaoolittle   Contact information:   [address] [place] [address] Main: [phone] Fax: [phone]      Follow up with [facility].   Specialty:  Behavioral Health   Why:  Since you have not been seen at this practice since 2014, they require you to go through their billing office to make an appointment. Please call on Monday to schedule your appointment.   Contact information:   [address] [place] [address] [phone]       Next level of care provider has access to [facility] Link:no  Patient denies SI/HI: Yes,  Pt denies    Safety Planning and Suicide Prevention discussed: Yes,  with mother; see SPE for further details  Have you used any form of tobacco in the last 30 days? (Cigarettes, Smokeless Tobacco, Cigars, and/or Pipes): Yes  Has patient been referred to the Quitline?: Patient refused referral  Elaina HoopsCarter, Ephrem Carrick M 09/27/2015, 8:29 AM

## 2015-09-27 NOTE — Progress Notes (Signed)
Patient ID: Mia Johnson, female   DOB: 02/07/1996, 19 y.o.   MRN: [id number]  Pt discharged home with her mom. Pt was stable and appreciative at that time. All papers and prescriptions were given and valuables returned. Verbal understanding expressed. Denies SI/HI and A/VH. Pt given opportunity to express concerns and ask questions.

## 2015-09-27 NOTE — Tx Team (Signed)
Interdisciplinary Treatment Plan Update (Adult) Date: 09/27/2015   Date: 09/27/2015 8:27 AM  Progress in Treatment:  Attending groups: Yes  Participating in groups: Yes  Taking medication as prescribed: Yes  Tolerating medication: Yes  Family/Significant othe contact made: Yes, with mother Patient understands diagnosis: Yes Discussing patient identified problems/goals with staff: Yes  Medical problems stabilized or resolved: Yes  Denies suicidal/homicidal ideation: Yes Patient has not harmed self or Others: Yes   New problem(s) identified: None identified at this time.   Discharge Plan or Barriers: Pt will return home and follow-up with her PCP and [facility]  Additional comments: n/a   Reason for Continuation of Hospitalization:  Depression Medication stabilization Suicidal ideation  Estimated length of stay: 0 days; Pt stable for DC  Review of initial/current patient goals per problem list:   1.  Goal(s): Patient will participate in aftercare plan  Met:  Yes  Target date: 3-5 days from date of admission   As evidenced by: Patient will participate within aftercare plan AEB aftercare provider and housing plan at discharge being identified.  09/27/2015: Pt will discharge home and follow-up with outpatient providers  2.  Goal (s): Patient will exhibit decreased depressive symptoms and suicidal ideations.  Met:  Yes  Target date: 3-5 days from date of admission   As evidenced by: Patient will utilize self rating of depression at 3 or below and demonstrate decreased signs of depression or be deemed stable for discharge by MD. 09/27/2015: Pt rates depression at 0/10; denies SI  3.  Goal(s): Patient will demonstrate decreased signs and symptoms of anxiety.  Met:  Yes  Target date: 3-5 days from date of admission   As evidenced by: Patient will utilize self rating of anxiety at 3 or below and demonstrated decreased signs of anxiety, or be  deemed stable for discharge by MD 09/27/2015: Pt rates anxiety at 0/10; interacts well with peers on the unit.  Attendees:  Patient:    Family:    Physician: Dr. Parke Poisson, MD  09/27/2015 8:27 AM  Nursing: Lars Pinks, RN Case manager  09/27/2015 8:27 AM  Clinical Social Worker Peri Maris, [place] 09/27/2015 8:27 AM  Other: Tilden Fossa, [place] 09/27/2015 8:27 AM  Clinical: Marcella Dubs, RN 09/27/2015 8:27 AM  Other:  RN Charge Nurse 09/27/2015 8:27 AM  Other:     Peri Maris, [place] Social Work [phone]
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
## 2015-09-27 NOTE — [facility] Suicide Risk Assessment (Signed)
[facility]   Demographic Factors:  19 year old female, no children,  lives with sister , employed   Total Time spent with patient: 30 minutes  Musculoskeletal: Strength & Muscle Tone: within normal limits Gait & Station: normal Patient leans: N/A  Psychiatric Specialty Exam: Physical Exam  ROS  Blood pressure 122/61, pulse 105, temperature 98.2 F (36.8 C), temperature source Oral, resp. rate 16, height 5\' 9"  (1.753 m), weight 122 lb (55.339 kg), last menstrual period 09/22/2015, SpO2 100 %.Body mass index is 18.01 kg/(m^2).  General Appearance: improved grooming   Eye Contact::  Good  Speech:  Normal Rate409  Volume:  Normal  Mood:  denies depression, mood improved   Affect:  Appropriate and more reactive   Thought Process:  Linear  Orientation:  Full (Time, Place, and Person)  Thought Content:  denies hallucinations, no delusions   Suicidal Thoughts:  No denies any suicidal ideations or any self injurious ideations  Homicidal Thoughts:  No denies any violent or homicidal ideations  Memory:  recent and remote grossly intact   Judgement:  Other:  improved   Insight:  Present  Psychomotor Activity:  Normal  Concentration:  Good  Recall:  Good  Fund of Knowledge:Good  Language: Good  Akathisia:  Negative  Handed:  Right  AIMS (if indicated):     Assets:  Communication Skills Desire for Improvement Resilience  Sleep:  Number of Hours: 6.75  Cognition: WNL  ADL's:  Intact   Have you used any form of tobacco in the last 30 days? (Cigarettes, Smokeless Tobacco, Cigars, and/or Pipes): Yes  Has this patient used any form of tobacco in the last 30 days? (Cigarettes, Smokeless Tobacco, Cigars, and/or Pipes) Yes, A prescription for an FDA-approved tobacco cessation medication was offered at discharge and the patient refused  Mental Status Per Nursing Assessment::   On Admission:     Current Mental Status by Physician: At this time patient is  improved compared to admission- she is denying depression at present and her affect is more reactive, brighter. No thought disorder, not suicidal , not homicidal , not psychotic and future oriented, looking forward to spending thanksgiving with her family. Plans to return to work next week.  Loss Factors: Finding out recently that father had relapsed into drug abuse, being teased/ criticized  by friends  Due to weight loss   Historical Factors: This is first psychiatric admission, denies history of self cutting or of suicide attempts  Risk Reduction Factors:   Sense of responsibility to family, Employed, Living with another person, especially a relative, Positive social support and Positive coping skills or problem solving skills  Continued Clinical Symptoms:  As noted, patient improved compared to admission. At this time mood is improved, affect appropriate, no SI or HI, no psychotic symptoms, future oriented   Cognitive Features That Contribute To Risk:  No gross cognitive deficits noted upon discharge. Is alert , attentive, and oriented x 3   Suicide Risk:  Mild:  Suicidal ideation of limited frequency, intensity, duration, and specificity.  There are no identifiable plans, no associated intent, mild dysphoria and related symptoms, good self-control (both objective and subjective assessment), few other risk factors, and identifiable protective factors, including available and accessible social support.  Principal Problem: MDD (major depressive disorder), recurrent severe, without psychosis [facility](HCC) Discharge Diagnoses:  Patient Active Problem List   Diagnosis Date Noted  . MDD (major depressive disorder), recurrent severe, without psychosis (HCC) [F33.2] 09/27/2015  . Severe  recurrent major depression without psychotic features (HCC) [F33.2] 09/24/2015  . Overdose [T50.901A] 09/24/2015  . Alcohol abuse [F10.10] 09/24/2015  . Tonsil stone [J35.8] 09/12/2015  . Vaginal discharge [N89.8]  08/10/2015  . Recurrent abdominal pain [R10.9] 12/22/2014  . Right ovarian cyst [N83.201] 11/10/2014  . UTI (urinary tract infection) [N39.0] 11/10/2014  . Thumb pain [M79.646] 10/12/2014  . Chest pain [R07.9] 10/12/2014  . Dysuria [R30.0] 08/12/2014  . Rotator cuff tendonitis [M75.80] 08/16/2013  . ETOH abuse [F10.10] 04/07/2013  . PTSD (post-traumatic stress disorder) [F43.10] 09/17/2012  . Insomnia [G47.00] 07/17/2012  . Adolescent problems [Z03.89] 03/19/2012  . Nonallopathic lesion of lumbosacral region [M99.03] 11/15/2011  . Adolescent depression [F32.9] 09/18/2011  . Genu valgus, congenital [Q74.1] 08/28/2011  . Cyclic vomiting syndrome [G43.A0] 12/26/2010  . Migraine [G43.909] 10/09/2010  . PES PLANUS [M21.40] 04/25/2010  . IRREGULAR MENSES [N92.6] 03/06/2010  . UNEQUAL LEG LENGTH [M21.70] 07/13/2009  . FOOT DEFORMITY, CONGENITAL [Q66.89] 05/11/2007  . Allergic rhinitis [J30.9] 01/01/2007    Follow-up Information    Follow up with [facility] On 10/11/2015.   Why:  at 2:00pm for medication management with Dr. Jeronimo Norma information:   [address] [place], [place] [address] Main: [phone] Fax: [phone]      Follow up with [facility].   Specialty:  Behavioral Health   Why:  Since you have not been seen at this practice since 2014, they require you to go through their billing office to make an appointment. Please call on Monday to schedule your appointment.   Contact information:   [address] [place] [address] [phone]       Plan Of Care/Follow-up recommendations:  Activity:  as tolerated  Diet:  Regular  Tests:  NA Other:  See below   Is patient on multiple antipsychotic therapies at discharge:  No   Has Patient had three or more failed trials of antipsychotic monotherapy by history:  No  Recommended Plan for Multiple Antipsychotic Therapies: NA  Patient is leaving unit in  good spirits . Plans to return to live with sister. States mother will be picking her up later today. Follow up as above .   COBOS, FERNANDO 09/27/2015, 10:12 AM

## 2015-10-11 ENCOUNTER — Ambulatory Visit: Payer: Self-pay | Admitting: Internal Medicine

## 2015-10-12 ENCOUNTER — Ambulatory Visit (INDEPENDENT_AMBULATORY_CARE_PROVIDER_SITE_OTHER): Payer: Medicaid Other | Admitting: Internal Medicine

## 2015-10-12 VITALS — BP 112/76 | Ht 69.0 in | Wt 129.0 lb

## 2015-10-12 DIAGNOSIS — F4310 Post-traumatic stress disorder, unspecified: Secondary | ICD-10-CM | POA: Diagnosis not present

## 2015-10-12 DIAGNOSIS — F32A Depression, unspecified: Secondary | ICD-10-CM

## 2015-10-12 DIAGNOSIS — F329 Major depressive disorder, single episode, unspecified: Secondary | ICD-10-CM | POA: Diagnosis not present

## 2015-10-12 DIAGNOSIS — B354 Tinea corporis: Secondary | ICD-10-CM | POA: Diagnosis not present

## 2015-10-12 MED ORDER — MIRTAZAPINE 15 MG PO TABS: 15.0000 mg | ORAL_TABLET | Freq: Every day | ORAL | Status: DC

## 2015-10-12 MED ORDER — KETOCONAZOLE 2 % EX CREA
1.0000 | TOPICAL_CREAM | Freq: Every day | CUTANEOUS | Status: DC
Start: 2015-10-12 — End: 2016-02-15

## 2015-10-12 MED ORDER — FLUOXETINE HCL 10 MG PO CAPS: 10.0000 mg | ORAL_CAPSULE | Freq: Every day | ORAL | Status: DC
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
## 2015-11-25 ENCOUNTER — Emergency Department (HOSPITAL_COMMUNITY)
Admission: EM | Admit: 2015-11-25 | Discharge: 2015-11-25 | Disposition: A | Discharge status: Home / Self Care | Payer: Medicaid Other | Attending: Emergency Medicine | Admitting: Emergency Medicine

## 2015-11-25 ENCOUNTER — Encounter (HOSPITAL_COMMUNITY): Payer: Self-pay | Admitting: Emergency Medicine

## 2015-11-25 DIAGNOSIS — J4540 Moderate persistent asthma, uncomplicated: Secondary | ICD-10-CM | POA: Insufficient documentation

## 2015-11-25 DIAGNOSIS — F17210 Nicotine dependence, cigarettes, uncomplicated: Secondary | ICD-10-CM | POA: Diagnosis not present

## 2015-11-25 DIAGNOSIS — Z79899 Other long term (current) drug therapy: Secondary | ICD-10-CM | POA: Diagnosis not present

## 2015-11-25 DIAGNOSIS — Z5189 Encounter for other specified aftercare: Secondary | ICD-10-CM

## 2015-11-25 DIAGNOSIS — Z8739 Personal history of other diseases of the musculoskeletal system and connective tissue: Secondary | ICD-10-CM | POA: Insufficient documentation

## 2015-11-25 DIAGNOSIS — Z8742 Personal history of other diseases of the female genital tract: Secondary | ICD-10-CM | POA: Diagnosis not present

## 2015-11-25 DIAGNOSIS — F329 Major depressive disorder, single episode, unspecified: Secondary | ICD-10-CM | POA: Insufficient documentation

## 2015-11-25 DIAGNOSIS — F4310 Post-traumatic stress disorder, unspecified: Secondary | ICD-10-CM | POA: Diagnosis not present

## 2015-11-25 DIAGNOSIS — Z4800 Encounter for change or removal of nonsurgical wound dressing: Secondary | ICD-10-CM | POA: Diagnosis present

## 2015-11-25 DIAGNOSIS — L539 Erythematous condition, unspecified: Secondary | ICD-10-CM | POA: Insufficient documentation

## 2015-11-25 MED ORDER — SULFAMETHOXAZOLE-TRIMETHOPRIM 800-160 MG PO TABS: 1.0000 | ORAL_TABLET | Freq: Two times a day (BID) | ORAL | Status: AC

## 2015-11-25 NOTE — Discharge Instructions (Signed)
Take your medications as prescribed. Keep your wound clean with antibacterial soap and water and dry. Please follow up with a primary care provider from the Resource Guide provided below in 2-3 days. Please return to the Emergency Department if symptoms worsen or new onset of fever, abdominal pain, nausea, vomiting, redness, swelling, warmth, drainage.   Emergency Department Resource Guide 1) Find a Doctor and Pay Out of Pocket Although you won't have to find out who is covered by your insurance plan, it is a good idea to ask around and get recommendations. You will then need to call the office and see if the doctor you have chosen will accept you as a new patient and what types of options they offer for patients who are self-pay. Some doctors offer discounts or will set up payment plans for their patients who do not have insurance, but you will need to ask so you aren't surprised when you get to your appointment.  2) Contact Your Local Health Department Not all health departments have doctors that can see patients for sick visits, but many do, so it is worth a call to see if yours does. If you don't know where your local health department is, you can check in your phone book. The CDC also has a tool to help you locate your state's health department, and many state websites also have listings of all of their local health departments.  3) Find a Walk-in Clinic If your illness is not likely to be very severe or complicated, you may want to try a walk in clinic. These are popping up all over the country in pharmacies, drugstores, and shopping centers. They're usually staffed by nurse practitioners or physician assistants that have been trained to treat common illnesses and complaints. They're usually fairly quick and inexpensive. However, if you have serious medical issues or chronic medical problems, these are probably not your best option.  No Primary Care Doctor: - Call Health Connect at  [phone] -  they can help you locate a primary care doctor that  accepts your insurance, provides certain services, etc. - Physician Referral Service- [phone]  Chronic Pain Problems: Organization         Address  Phone   Notes  [facility]  [phone] Patients need to be referred by their primary care doctor.   Medication Assistance: Organization         Address  Phone   Notes  [facility] [address] [place] [place], [place] [address] [phone] --Must be a resident of [facility] -- Must have NO insurance coverage whatsoever (no Medicaid/ Medicare, etc.) -- The pt. MUST have a primary care doctor that directs their care regularly and follows them in the community   [facility]  [phone]   [employer]  [phone]    Agencies that provide inexpensive medical care: Organization         Address  Phone   Notes  [name]  [phone]   [name]    [phone]   [facility] [address] [place], [place] [address] [phone]   [facility] [address] [place]. [address] [place] [phone]   Planned Parenthood    [phone]   [facility]    [phone]   [facility]  [address], [place] Phone:  [phone], Fax:  [phone]  Hours of Operation:  9 am - 6 pm, M-F.  Also accepts Medicaid/Medicare and self-pay.  [facility] for [place], [address], [place] Phone: [phone], Fax: [phone]. Hours of Operation:  8:30 am - 5:30 pm, M-F.  Also accepts Medicaid and self-pay.  [facility] [place] [address] [place] Phone: [phone]   [place], [place], [place] [phone], Ext. [phone] Mondays & Thursdays: 7-9 AM.  First 15 patients are seen on a first come, first serve basis.    [place] Providers:  Organization         Address  Phone   Notes  [facility] [address], [place] [phone] Also accepts self-pay patients.  [facility] [id number] [place], [place]  [phone]   [place], [address], [place] [phone]   [facility] [address] [place] [phone]   Lucianne Lei [address], [place]   [phone] Only accepts [place] Access [place] patients after they have their name applied to their card.   Self-Pay (no insurance) in [facility]:  Organization         Address  Phone   Notes  Sickle Cell Patients, [facility] Internal Medicine [place] [phone]   [facility] [place] [phone]   [facility] [place]  [place], [place], [place] [phone]   [facility]/Dr. Osei-Bonsu  [address] [place] or [address], [place] [phone] Phone number for both [place] and [employer] locations is the same.  Urgent Medical and [facility] [address], [place] [phone]   [facility] [address], [place] or [address] [phone]   [facility] [address], [place] [phone], phone; [phone], fax Sees patients 1st and 3rd Saturday of every month.  Must not qualify for public or private insurance (i.e. Medicaid, Medicare, [place] Health Choice, Veterans' Benefits)  Household income should be no more than 200% of the poverty level The clinic cannot treat you if you are pregnant or think you are pregnant  Sexually transmitted diseases are not treated at the clinic.    Dental Care: Organization         Address  Phone  Notes  [facility] of [facility] [place] [phone] Accepts children up to age 43 who are enrolled in [place] or [place]; pregnant women with a Medicaid card; and children who have applied for Medicaid or [place] Health Choice, but were declined, whose parents can pay a reduced fee at time of service.  [facility] of [facility]  [address], [place] [phone] Accepts children up to age 45 who are enrolled in [place] or [place]; pregnant women with a Medicaid card; and children who have applied for Medicaid or [place] Health Choice, but were declined, whose parents can pay a reduced fee at time of service.  [place] Adult Dental Access PROGRAM  [place] [phone] Patients are seen by appointment only. Walk-ins are not accepted. [place] will  see patients 42 years of age and older. Monday - Tuesday (8am-5pm) Most Wednesdays (8:30-5pm) $30 per visit, cash only  [facility] Adult Dental Access PROGRAM  [address], [facility] [phone] Patients are seen by appointment only. Walk-ins are not accepted. [place] will see patients 35 years of age and older. One Wednesday Evening (Monthly: Volunteer Based).  $30 per visit, cash only  [place]  [phone] for adults; Children under age 10, call Graduate Pediatric Dentistry at [phone]. Children aged 32-14, please call [phone] to request a pediatric application.  Dental services are provided in all areas of dental care including fillings, crowns and bridges, complete and partial dentures, implants, gum treatment, root canals, and extractions. Preventive care is also provided. Treatment is provided to both adults and children. Patients are selected via a lottery and there is often a waiting list.   [facility] [address], [place]  [phone] [email or web address]   [facility]  [facility] [address] [place], [place] [phone], Ext. [phone] Second and Fourth Thursday of each month, opens at 6:30 AM; Clinic ends at 9 AM.  Patients are seen on a first-come first-served basis, and a limited number are seen during each clinic.   [facility]  [address] Hillard Danker [place], [place] [phone]   Eligibility Requirements You must have lived in [place], [place], or [place] counties for at least the last three months.   You cannot be eligible for state or federal sponsored [employer], including [employer], [place], or [employer].   You generally cannot be eligible for healthcare insurance through your employer.    How to apply: Eligibility screenings are held every Tuesday and Wednesday afternoon from 1:00 pm until 4:00 pm. You do not need an appointment for the interview!  [facility] [address], [place], [place]   [place]  [place] Department  [place]  [phone]    Behavioral Health Resources in the Community: Intensive Outpatient Programs Organization         Address  Phone  Notes  [place] [facility]. [address], [place], [place] [phone]   [address] [facility] Outpatient [address], [place], [place]   ADS: [facility] [address] [place], [place]   [place] [address],  [place], [place] or [phone]   Substance Abuse Resources Organization         Address  Phone  Notes  Alcohol and Drug Services  [phone]   [place]  [phone]   [facility]   Chinita Pester  [phone]   Residential & Outpatient Substance Abuse Program  [phone]   Psychological Services Organization         Address  Phone  Notes  [facility]   [place]  [phone]   [place] [address], [place] or [phone]    Mobile Crisis Teams Organization         Address  Phone  Notes  [facility], Mobile Crisis Care Unit  [phone]   [facility]  [address] [place], [place]   [facility] [address] [place] [phone]    Self-Help/Support Groups Organization  Address  Phone             Notes  [facility]ental [facility] of [place] - variety of support groups  336- [phone] Call for more information  Narcotics Anonymous (NA), [facility] [address], [place]  2 meetings at this location   Statisticianesidential Treatment Programs Organization         Address  Phone  Notes  [facility] [address],    [place]  [phone]   [facility]  [address], [place] [phone], [place], [place] [phone]   [facility] [address], [place] [phone] Admissions: 8am-3pm M-F  [facility] [address]    [place], [place] [phone]   [facility] [address], [place], [place] [phone]   [facility] [address]  [place], [place] [phone]   [facility] - Intensive Outpatient [address], [place], [place] [phone]   [facility] (Addiction Recovery Care [facility] [address]  [place], [place] [phone] or [phone]   [facility] Services (RTS) [address] [place], [place] [phone] Accepts Medicaid  [facility] [address]  [place] [phone] Substance Abuse/Addiction Treatment   [facility] Behavioral Health Resources Organization         Address  Phone  Notes  [facility]  [phone]) [phone]   Angie FavaJulie Brannon, PhD [address], [address] [place], [place]   [phone]) [phone] or  [phone](336) [phone]   [facility]   [name] [place], [place] [phone](336) [phone]   [facility] [address], [place], [place] [phone]) [phone] Insurance/Medicaid/sponsorship through [facility]  [facility] [address]                                    [place], [place] [phone]) [phone] Therapy/tele-psych/case  [facility] [address].   [place], [place] [phone]) [phone]    Dr. Lolly MustacheArfeen  [phone](336) [phone]   [facility]  [employer] [facility] 1) [address], [place] 2) [address], [place] 3)  [address], [place] [phone]) [phone](336) [phone]  [phone](336) [phone]   [facility] Child Abuse Hotline [phone]) [phone] or [phone](336) [phone] (After Hours)

## 2015-11-25 NOTE — ED Notes (Signed)
Pt sts infected dermal piercing to stomach with some redness and purulent discharge

## 2015-11-25 NOTE — ED Provider Notes (Signed)
CSN: [id number]     Arrival date & time 11/25/15  1002 History  By signing my name below, I, Linus Galas, attest that this documentation has been prepared under the direction and in the presence of Barrett Henle, PA-C. Electronically Signed: Linus Galas, ED Scribe. 11/25/2015. 12:07 PM.     Chief Complaint  Patient presents with  . Wound Check    The history is provided by the patient. No language interpreter was used.    HPI Comments: Mia Johnson is a 20 y.o. female who presents to the Emergency Department for a wound check today. Pt had a dermal piercing on her abdomen 2 days ago and reports associated redness and minimal light brown drainage from the pierced site that began last night.  She notes she got the piercing 1 week ago. Pt has been cleaning the pierced site with saline and hydrogen peroxide. Pt denies any fevers, abdominal pain, nausea, or vomiting, swelling or warmth.   Past Medical History  Diagnosis Date  . Asthma, moderate persistent   . Pes planus (flat feet)   . Environmental allergies   . Marijuana smoker (HCC)   . Abdominal pain   . Depression   . PTSD (post-traumatic stress disorder)   . Ovarian cyst    Past Surgical History  Procedure Laterality Date  . Nose surgery     Family History  Problem Relation Age of Onset  . Obesity Mother    Social History  Substance Use Topics  . Smoking status: Light Tobacco Smoker -- 0.10 packs/day    Types: Cigarettes    Last Attempt to Quit: 03/07/2014  . Smokeless tobacco: Never Used     Comment: smokes marajuana at times  . Alcohol Use: Yes     Comment: ocassionally   OB History    No data available     Review of Systems  Constitutional: Negative for fever.  Gastrointestinal: Negative for nausea, vomiting and abdominal pain.  Skin: Positive for color change (redness) and wound (dermal piercing ).       +drainage      Allergies  Shellfish-derived products  Home Medications    Prior to Admission medications   Medication Sig Start Date End Date Taking? Authorizing Provider  albuterol (PROVENTIL HFA;VENTOLIN HFA) 108 (90 BASE) MCG/ACT inhaler Inhale 2 puffs into the lungs every 6 (six) hours as needed for wheezing or shortness of breath. Patient not taking: Reported on 07/13/2015 09/19/14   Nani Ravens, MD  EPINEPHrine (EPIPEN 2-PAK) 0.3 mg/0.3 mL IJ SOAJ injection Inject 0.3 mLs (0.3 mg total) into the muscle once. 09/27/15   Beau Fanny, FNP  FLUoxetine (PROZAC) 10 MG capsule Take 1 capsule (10 mg total) by mouth daily. 10/12/15   [name], MD  FLUoxetine (PROZAC) 10 MG capsule Take 1 capsule (10 mg total) by mouth daily. 10/12/15   [name], MD  hydrOXYzine (ATARAX/VISTARIL) 25 MG tablet Take 1 tablet (25 mg total) by mouth 3 (three) times daily as needed for anxiety or nausea. 09/27/15   Beau Fanny, FNP  ketoconazole (NIZORAL) 2 % cream Apply 1 application topically daily. 10/12/15   [name], MD  mirtazapine (REMERON) 15 MG tablet Take 1 tablet (15 mg total) by mouth at bedtime. 10/12/15   [name], MD  pantoprazole (PROTONIX) 20 MG tablet Take 1 tablet (20 mg total) by mouth daily. 09/27/15   Beau Fanny, FNP  sulfamethoxazole-trimethoprim (BACTRIM DS,SEPTRA DS) 800-160 MG tablet Take  1 tablet by mouth 2 (two) times daily. 11/25/15 12/02/15  Satira Sark Nadeau, PA-C   BP 131/80 mmHg  Pulse 67  Temp(Src) 98.3 F (36.8 C) (Oral)  Resp 18  SpO2 100% Physical Exam  Constitutional: She is oriented to person, place, and time. She appears well-developed and well-nourished.  HENT:  Head: Normocephalic and atraumatic.  Eyes: Conjunctivae and EOM are normal. Right eye exhibits no discharge. Left eye exhibits no discharge. No scleral icterus.  Neck: Normal range of motion. Neck supple.  Cardiovascular: Normal rate.   Pulmonary/Chest: Effort normal. No respiratory distress.  Abdominal: Soft. She exhibits no  distension and no mass. There is no tenderness. There is no rebound and no guarding.  Neurological: She is alert and oriented to person, place, and time.  Skin: Skin is warm and dry. No erythema.  Single dermal piecing noted superior to umbilicus. Mild surrounding erythema with small amount of dried light brown drainage noted around wound. No surrounding swelling, warmth, drainage, induration, or fluctuance.   Psychiatric: She has a normal mood and affect.  Nursing note and vitals reviewed.   ED Course  Procedures  DIAGNOSTIC STUDIES: Oxygen Saturation is 100% on RA, nl by my interpretation.    COORDINATION OF CARE: 11:43 AM- Advised to clean pierced site with antibacterial soap and apply OTC abx ointment. Discussed treatment plan with pt at bedside and pt agreed to plan. Return precautions given.   Labs Review Labs Reviewed - No data to display  Imaging Review No results found.   Filed Vitals:   11/25/15 1014 11/25/15 1215  BP: 136/76 131/80  Pulse: 73 67  Temp:  98.3 F (36.8 C)  Resp: 20 18     MDM   Final diagnoses:  Visit for wound check    Patient presents wound check for a dermal piercing she received a week ago. Endorses mild redness and drainage at site. Denies fever. VSS. Exam revealed single dermal piercing superior to the umbilicus, mild surrounding erythema with small amount of dried blood noted to wound, no surrounding swelling warm drainage induration or fluctuance. No evidence of abscess. Plan to discharge patient home with Bactrim. Discussed wound care with patient. Patient given resource guide to follow up with PCP.  Evaluation does not show pathology requring ongoing emergent intervention or admission. Pt is hemodynamically stable and mentating appropriately. Discussed findings/results and plan with patient/guardian, who agrees with plan. All questions answered. Return precautions discussed and outpatient follow up given.    I personally performed the  services described in this documentation, which was scribed in my presence.The recorded information has been reviewed and is accurate.    Satira Sark [place], [place] 11/25/15 1610  Doug Sou, MD 11/25/15 [phone]

## 2015-11-25 NOTE — ED Notes (Signed)
PT denies any fever ,chills ,nausea ,volmiting or diarrhea.

## 2015-11-25 NOTE — ED Notes (Signed)
Declined W/C at D/C and was escorted to lobby by RN. 

## 2015-12-27 ENCOUNTER — Encounter (HOSPITAL_COMMUNITY): Payer: Self-pay | Admitting: Emergency Medicine

## 2015-12-27 ENCOUNTER — Emergency Department (HOSPITAL_COMMUNITY)
Admission: EM | Admit: 2015-12-27 | Discharge: 2015-12-27 | Disposition: A | Discharge status: Home / Self Care | Payer: Medicaid Other | Attending: Emergency Medicine | Admitting: Emergency Medicine

## 2015-12-27 ENCOUNTER — Emergency Department (HOSPITAL_COMMUNITY): Payer: Medicaid Other

## 2015-12-27 ENCOUNTER — Telehealth: Payer: Self-pay | Admitting: Family Medicine

## 2015-12-27 DIAGNOSIS — R079 Chest pain, unspecified: Secondary | ICD-10-CM | POA: Diagnosis present

## 2015-12-27 DIAGNOSIS — J4541 Moderate persistent asthma with (acute) exacerbation: Secondary | ICD-10-CM | POA: Diagnosis not present

## 2015-12-27 DIAGNOSIS — F17210 Nicotine dependence, cigarettes, uncomplicated: Secondary | ICD-10-CM | POA: Insufficient documentation

## 2015-12-27 LAB — BASIC METABOLIC PANEL
ANION GAP: 12 (ref 5–15)
BUN: 13 mg/dL (ref 6–20)
CALCIUM: 9.6 mg/dL (ref 8.9–10.3)
CO2: 25 mmol/L (ref 22–32)
CREATININE: 0.85 mg/dL (ref 0.44–1.00)
Chloride: 105 mmol/L (ref 101–111)
Glucose, Bld: 104 mg/dL — ABNORMAL HIGH (ref 65–99)
Potassium: 4 mmol/L (ref 3.5–5.1)
SODIUM: 142 mmol/L (ref 135–145)

## 2015-12-27 LAB — CBC
HCT: 38 % (ref 36.0–46.0)
Hemoglobin: 12.4 g/dL (ref 12.0–15.0)
MCH: 27.1 pg (ref 26.0–34.0)
MCHC: 32.6 g/dL (ref 30.0–36.0)
MCV: 83.2 fL (ref 78.0–100.0)
PLATELETS: 266 10*3/uL (ref 150–400)
RBC: 4.57 MIL/uL (ref 3.87–5.11)
RDW: 12.6 % (ref 11.5–15.5)
WBC: 7.9 10*3/uL (ref 4.0–10.5)

## 2015-12-27 LAB — I-STAT TROPONIN, ED: TROPONIN I, POC: 0 ng/mL (ref 0.00–0.08)

## 2015-12-27 LAB — I-STAT CG4 LACTIC ACID, ED: LACTIC ACID, VENOUS: 0.43 mmol/L — AB (ref 0.5–2.0)

## 2015-12-27 NOTE — ED Notes (Signed)
Patient here with complaint of SOB and chest pain. States that she has had cough and congestion for a couple weeks along with the SOB. Hx of Asthma. States onset of chest pain tonight.

## 2015-12-27 NOTE — Telephone Encounter (Signed)
EMERGENCY PHONE LINE:  Received call from mother of Mrs. Mia Johnson. States she has fluid on her ears and around her teeth. Has had this before. Currently in Emergency Department, but she has been down there for four hours and she was told it may be another four hours. Requests physician to come to waiting room to evaluate [facility]. Discussed that this is against our policy. Recommend waiting to be seen in ED. Requests office visit to be made for this morning, however none available with any residents or attending physician. Recommend calling after office opens to see if same day or work in available..   Dr. Caroleen Hamman 12/27/15, 5:02 AM

## 2016-02-11 ENCOUNTER — Encounter (HOSPITAL_COMMUNITY): Payer: Self-pay

## 2016-02-11 ENCOUNTER — Emergency Department (HOSPITAL_COMMUNITY): Payer: Medicaid Other

## 2016-02-11 ENCOUNTER — Observation Stay (HOSPITAL_COMMUNITY): Payer: Medicaid Other

## 2016-02-11 ENCOUNTER — Inpatient Hospital Stay (HOSPITAL_COMMUNITY)
Admission: EM | Admit: 2016-02-11 | Discharge: 2016-02-15 | DRG: 605 | Disposition: A | Discharge status: Other Acute Inpatient Hospital | Payer: Medicaid Other | Attending: Family Medicine | Admitting: Family Medicine

## 2016-02-11 DIAGNOSIS — R45851 Suicidal ideations: Secondary | ICD-10-CM

## 2016-02-11 DIAGNOSIS — X789XXA Intentional self-harm by unspecified sharp object, initial encounter: Secondary | ICD-10-CM | POA: Diagnosis present

## 2016-02-11 DIAGNOSIS — F1010 Alcohol abuse, uncomplicated: Secondary | ICD-10-CM

## 2016-02-11 DIAGNOSIS — F17210 Nicotine dependence, cigarettes, uncomplicated: Secondary | ICD-10-CM | POA: Diagnosis present

## 2016-02-11 DIAGNOSIS — T496X2A Poisoning by otorhinolaryngological drugs and preparations, intentional self-harm, initial encounter: Secondary | ICD-10-CM | POA: Diagnosis present

## 2016-02-11 DIAGNOSIS — F329 Major depressive disorder, single episode, unspecified: Secondary | ICD-10-CM | POA: Diagnosis present

## 2016-02-11 DIAGNOSIS — Z915 Personal history of self-harm: Secondary | ICD-10-CM

## 2016-02-11 DIAGNOSIS — E872 Acidosis, unspecified: Secondary | ICD-10-CM | POA: Insufficient documentation

## 2016-02-11 DIAGNOSIS — N179 Acute kidney failure, unspecified: Secondary | ICD-10-CM | POA: Diagnosis present

## 2016-02-11 DIAGNOSIS — F32A Depression, unspecified: Secondary | ICD-10-CM | POA: Insufficient documentation

## 2016-02-11 DIAGNOSIS — Z91013 Allergy to seafood: Secondary | ICD-10-CM

## 2016-02-11 DIAGNOSIS — S51812A Laceration without foreign body of left forearm, initial encounter: Principal | ICD-10-CM | POA: Diagnosis present

## 2016-02-11 DIAGNOSIS — F4310 Post-traumatic stress disorder, unspecified: Secondary | ICD-10-CM | POA: Diagnosis present

## 2016-02-11 DIAGNOSIS — F332 Major depressive disorder, recurrent severe without psychotic features: Secondary | ICD-10-CM | POA: Diagnosis present

## 2016-02-11 DIAGNOSIS — T1491XA Suicide attempt, initial encounter: Secondary | ICD-10-CM | POA: Diagnosis present

## 2016-02-11 DIAGNOSIS — F339 Major depressive disorder, recurrent, unspecified: Secondary | ICD-10-CM

## 2016-02-11 LAB — COMPREHENSIVE METABOLIC PANEL
ALT: 18 U/L (ref 14–54)
AST: 27 U/L (ref 15–41)
Albumin: 4.3 g/dL (ref 3.5–5.0)
Alkaline Phosphatase: 73 U/L (ref 38–126)
Anion gap: 16 — ABNORMAL HIGH (ref 5–15)
BUN: 11 mg/dL (ref 6–20)
CHLORIDE: 109 mmol/L (ref 101–111)
CO2: 17 mmol/L — AB (ref 22–32)
CREATININE: 1.06 mg/dL — AB (ref 0.44–1.00)
Calcium: 9.8 mg/dL (ref 8.9–10.3)
GFR calc non Af Amer: >60 mL/min (ref >60)
Glucose, Bld: 97 mg/dL (ref 65–99)
POTASSIUM: 3.9 mmol/L (ref 3.5–5.1)
SODIUM: 142 mmol/L (ref 135–145)
Total Bilirubin: 0.4 mg/dL (ref 0.3–1.2)
Total Protein: 7.2 g/dL (ref 6.5–8.1)

## 2016-02-11 LAB — URINALYSIS, ROUTINE W REFLEX MICROSCOPIC
BILIRUBIN URINE: NEGATIVE
Glucose, UA: NEGATIVE mg/dL
Hgb urine dipstick: NEGATIVE
Ketones, ur: NEGATIVE mg/dL
NITRITE: NEGATIVE
PH: 6 (ref 5.0–8.0)
Protein, ur: NEGATIVE mg/dL
SPECIFIC GRAVITY, URINE: 1.006 (ref 1.005–1.030)

## 2016-02-11 LAB — URINE MICROSCOPIC-ADD ON

## 2016-02-11 LAB — CBG MONITORING, ED: GLUCOSE-CAPILLARY: 91 mg/dL (ref 65–99)

## 2016-02-11 LAB — CBC WITH DIFFERENTIAL/PLATELET
BASOS ABS: 0 10*3/uL (ref 0.0–0.1)
BASOS PCT: 1 %
EOS ABS: 0.2 10*3/uL (ref 0.0–0.7)
EOS PCT: 2 %
HCT: 40.5 % (ref 36.0–46.0)
Hemoglobin: 13.5 g/dL (ref 12.0–15.0)
Lymphocytes Relative: 21 %
Lymphs Abs: 1.8 10*3/uL (ref 0.7–4.0)
MCH: 27.8 pg (ref 26.0–34.0)
MCHC: 33.3 g/dL (ref 30.0–36.0)
MCV: 83.3 fL (ref 78.0–100.0)
MONO ABS: 0.5 10*3/uL (ref 0.1–1.0)
Monocytes Relative: 6 %
Neutro Abs: 6.2 10*3/uL (ref 1.7–7.7)
Neutrophils Relative %: 70 %
PLATELETS: 304 10*3/uL (ref 150–400)
RBC: 4.86 MIL/uL (ref 3.87–5.11)
RDW: 13.3 % (ref 11.5–15.5)
WBC: 8.8 10*3/uL (ref 4.0–10.5)

## 2016-02-11 LAB — BASIC METABOLIC PANEL
Anion gap: 8 (ref 5–15)
BUN: 5 mg/dL — AB (ref 6–20)
CALCIUM: 8.2 mg/dL — AB (ref 8.9–10.3)
CO2: 22 mmol/L (ref 22–32)
CREATININE: 0.76 mg/dL (ref 0.44–1.00)
Chloride: 114 mmol/L — ABNORMAL HIGH (ref 101–111)
GFR calc Af Amer: >60 mL/min (ref >60)
GLUCOSE: 87 mg/dL (ref 65–99)
Potassium: 4 mmol/L (ref 3.5–5.1)
Sodium: 144 mmol/L (ref 135–145)

## 2016-02-11 LAB — I-STAT CHEM 8, ED
BUN: 12 mg/dL (ref 6–20)
CALCIUM ION: 1.07 mmol/L — AB (ref 1.12–1.23)
Chloride: 109 mmol/L (ref 101–111)
Creatinine, Ser: 1.2 mg/dL — ABNORMAL HIGH (ref 0.44–1.00)
GLUCOSE: 96 mg/dL (ref 65–99)
HCT: 45 % (ref 36.0–46.0)
HEMOGLOBIN: 15.3 g/dL — AB (ref 12.0–15.0)
POTASSIUM: 3.8 mmol/L (ref 3.5–5.1)
Sodium: 143 mmol/L (ref 135–145)
TCO2: 17 mmol/L (ref 0–100)

## 2016-02-11 LAB — I-STAT CG4 LACTIC ACID, ED
LACTIC ACID, VENOUS: 2.48 mmol/L — AB (ref 0.5–2.0)
LACTIC ACID, VENOUS: 5.98 mmol/L — AB (ref 0.5–2.0)

## 2016-02-11 LAB — RAPID URINE DRUG SCREEN, HOSP PERFORMED
Amphetamines: NOT DETECTED
BENZODIAZEPINES: NOT DETECTED
Barbiturates: NOT DETECTED
COCAINE: NOT DETECTED
Opiates: NOT DETECTED
Tetrahydrocannabinol: NOT DETECTED

## 2016-02-11 LAB — SALICYLATE LEVEL

## 2016-02-11 LAB — ACETAMINOPHEN LEVEL: Acetaminophen (Tylenol), Serum: <10 ug/mL — ABNORMAL LOW (ref 10–30)

## 2016-02-11 LAB — POC URINE PREG, ED: PREG TEST UR: NEGATIVE

## 2016-02-11 LAB — LACTIC ACID, PLASMA: LACTIC ACID, VENOUS: 1.3 mmol/L (ref 0.5–2.0)

## 2016-02-11 LAB — ETHANOL: Alcohol, Ethyl (B): 174 mg/dL — ABNORMAL HIGH (ref <5)

## 2016-02-11 LAB — MAGNESIUM: MAGNESIUM: 2.1 mg/dL (ref 1.7–2.4)

## 2016-02-11 MED ORDER — METOCLOPRAMIDE HCL 5 MG/ML IJ SOLN
10.0000 mg | Freq: Once | INTRAMUSCULAR | Status: AC
  Administered 2016-02-11: 10 mg via INTRAVENOUS
  Filled 2016-02-11: qty 2

## 2016-02-11 MED ORDER — CHARCOAL ACTIVATED PO LIQD
50.0000 g | Freq: Once | ORAL | Status: DC
  Filled 2016-02-11: qty 240

## 2016-02-11 MED ORDER — HALOPERIDOL LACTATE 5 MG/ML IJ SOLN
INTRAMUSCULAR | Status: AC
  Filled 2016-02-11: qty 1

## 2016-02-11 MED ORDER — DIAZEPAM 5 MG/ML IJ SOLN
INTRAMUSCULAR | Status: AC
  Administered 2016-02-11: 10 mg via INTRAVENOUS
  Filled 2016-02-11: qty 2

## 2016-02-11 MED ORDER — DIAZEPAM 5 MG/ML IJ SOLN
10.0000 mg | Freq: Once | INTRAMUSCULAR | Status: AC
  Administered 2016-02-11: 10 mg via INTRAVENOUS

## 2016-02-11 MED ORDER — TETANUS-DIPHTH-ACELL PERTUSSIS 5-2.5-18.5 LF-MCG/0.5 IM SUSP
0.5000 mL | Freq: Once | INTRAMUSCULAR | Status: AC
  Administered 2016-02-11: 0.5 mL via INTRAMUSCULAR
  Filled 2016-02-11: qty 0.5

## 2016-02-11 MED ORDER — SODIUM CHLORIDE 0.9 % IV BOLUS (SEPSIS)
1000.0000 mL | Freq: Once | INTRAVENOUS | Status: AC
  Administered 2016-02-11: 1000 mL via INTRAVENOUS

## 2016-02-11 MED ORDER — LORAZEPAM 2 MG/ML IJ SOLN
INTRAMUSCULAR | Status: AC
  Filled 2016-02-11: qty 1

## 2016-02-11 MED ORDER — SODIUM CHLORIDE 0.9 % IV SOLN
INTRAVENOUS | Status: DC
  Administered 2016-02-11 (×2): via INTRAVENOUS
  Administered 2016-02-11: 1000 mL via INTRAVENOUS

## 2016-02-11 MED ORDER — SODIUM CHLORIDE 0.9% FLUSH
3.0000 mL | Freq: Two times a day (BID) | INTRAVENOUS | Status: DC
  Administered 2016-02-11: 3 mL via INTRAVENOUS

## 2016-02-11 MED ORDER — HALOPERIDOL LACTATE 5 MG/ML IJ SOLN
5.0000 mg | Freq: Once | INTRAMUSCULAR | Status: AC
  Administered 2016-02-11: 5 mg via INTRAVENOUS

## 2016-02-11 MED ORDER — SODIUM CHLORIDE 0.9 % IV BOLUS (SEPSIS)
1000.0000 mL | Freq: Once | INTRAVENOUS | Status: AC
Start: 2016-02-11 — End: 2016-02-11
  Administered 2016-02-11: 1000 mL via INTRAVENOUS

## 2016-02-11 NOTE — Progress Notes (Signed)
NURSING PROGRESS NOTE  Mia SnideSamantha Johnson [id number] Admission Data: 02/11/2016 5:04 AM Attending Provider: Leighton Roachodd D McDiarmid, MD NWG:NFAOZHPCP:Andrew Waynetta SandyWight, MD Code Status: FULL  Allergies:  Shellfish-derived products Past Medical History:   has a past medical history of Asthma, moderate persistent; Pes planus (flat feet); Environmental allergies; Marijuana smoker (HCC); Abdominal pain; Depression; PTSD (post-traumatic stress disorder); and Ovarian cyst. Past Surgical History:   has past surgical history that includes Nose surgery. Social History:   reports that she has been smoking Cigarettes.  She has been smoking about 0.10 packs per day. She has never used smokeless tobacco. She reports that she drinks alcohol. She reports that she does not use illicit drugs.  Mia SnideSamantha Johnson is a 20 y.o. female patient admitted from ED:   Last Documented Vital Signs: Blood pressure 98/43, pulse 71, temperature 97.9 F (36.6 C), temperature source Oral, resp. rate 18, SpO2 99 %.  Cardiac Monitoring: Box # 01 in place. Cardiac monitor yields:normal sinus rhythm.  IV Fluids:  IV in place, occlusive dsg intact without redness, IV cath antecubital left, condition patent and no redness none.   Skin: Self inflected wound on left wrist.  Abrasion on right shin. Skin appropriate for ethnicity.  Patient not orientated to room due to sleeping. Information packet given to patient.  Side rails up x 2, fall assessment and education completed with patient. Patient unable to verbalize understanding of risk associated with falls and verbalized understanding to call for assistance before getting out of bed due to sleeping. Call light within reach. Patient unwilling to voice and demonstrate understanding of unit orientation instructions.    Will continue to evaluate and treat per MD orders.  Sue LushKaelin Romesberg RN, BSN

## 2016-02-11 NOTE — ED Notes (Signed)
Poison Control followed up with david. Regarding pt status.
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
## 2016-02-11 NOTE — Progress Notes (Signed)
Received report from ED RN, Lequita HaltMorgan.

## 2016-02-11 NOTE — Progress Notes (Signed)
Call received from poison control center in reference of pt.  Checking to see how her orientation and VS were after receiving Halodol last night.  VS reported and stated she was closing the case out.  Forbes Cellarelcine Caci Orren, RN
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
## 2016-02-11 NOTE — Progress Notes (Signed)
Report attempted 

## 2016-02-12 DIAGNOSIS — T1491 Suicide attempt: Secondary | ICD-10-CM

## 2016-02-12 DIAGNOSIS — F332 Major depressive disorder, recurrent severe without psychotic features: Secondary | ICD-10-CM

## 2016-02-12 DIAGNOSIS — F10129 Alcohol abuse with intoxication, unspecified: Secondary | ICD-10-CM

## 2016-02-12 DIAGNOSIS — R45851 Suicidal ideations: Secondary | ICD-10-CM

## 2016-02-12 LAB — BASIC METABOLIC PANEL
ANION GAP: 10 (ref 5–15)
BUN: <5 mg/dL — ABNORMAL LOW (ref 6–20)
CO2: 21 mmol/L — ABNORMAL LOW (ref 22–32)
Calcium: 8.9 mg/dL (ref 8.9–10.3)
Chloride: 109 mmol/L (ref 101–111)
Creatinine, Ser: 0.85 mg/dL (ref 0.44–1.00)
GFR calc Af Amer: >60 mL/min (ref >60)
Glucose, Bld: 85 mg/dL (ref 65–99)
POTASSIUM: 3.7 mmol/L (ref 3.5–5.1)
SODIUM: 140 mmol/L (ref 135–145)

## 2016-02-12 NOTE — Progress Notes (Signed)
Family Medicine Teaching Service Daily Progress Note Intern Pager: [phone]  Patient name: Mia Johnson Medical record number: [id number] Date of birth: 02/13/1996 Age: 20 y.o. Gender: female  Primary Care Provider: Tawni CarnesAndrew Wight, MD Consultants: Psych  Code Status: FULL   Pt Overview and Major Events to Date:  4/8: patient admitted for AKI and lactic acidosis after intentional ingestion   Assessment and Plan: Mia Johnson is a 20 y.o. female presenting with suicide attempt . PMH is significant for depression and previous suicide attempt.   Suicide Attempt: Patient ingested unknown household cleaner, [employer] optic mouthwash, and unknown pills. Electrolytes and vital signs have remained stable. -attempted to consult psych yesterday without response, will contact again this AM  -suicide precautions with sitter at bedside    AKI, Resolved: Cr elevated to 1.20 at admission(bl 0.7). Now back at baseline. Likely 2/2 prerenal injury with emesis due to quick return to normal vs. to recent ingestion. -SLIV   Lactic Acidosis, resolved: Initially elevated to 5.98. Now 1.3 after IVFs.   Depression/Anxiety: With past history of suicide attempt with trazadone. Mother reports patient has not been taking any psych medications.  -psych consult in AM, will defer to them for management   History of EtOH abuse: Mom reports patient still drinking and Ethanol level 174 at admission.  -CIWA protocol ordered: scores have been 0   FEN/GI: Heart Healthy Diet, KVO  Prophylaxis: SCDs   Disposition: Pending psych recs   Subjective:  No complaints this morning. Denies pain. No N/V. Tolerating PO.   Objective: Temp:  [97.7 F (36.5 C)-98.5 F (36.9 C)] 97.7 F (36.5 C) (04/10 0606) Pulse Rate:  [69-82] 74 (04/10 0606) Resp:  [18] 18 (04/10 0606) BP: (120-133)/(52-70) 123/70 mmHg (04/10 0606) SpO2:  [99 %-100 %] 99 % (04/10 0606) Physical Exam: General: female lying in bed in NAD,  non-toxic  Cardiovascular: RRR. No murmurs appreciated. Respiratory: CTAB. Normal WOB.  Abdomen: +BS, soft, NTND  Extremities: No LE edema  Psych: Flat affect   Laboratory:  Recent Labs Lab 02/11/16 0029 02/11/16 0043  WBC 8.8  --   HGB 13.5 15.3*  HCT 40.5 45.0  PLT 304  --     Recent Labs Lab 02/11/16 0029 02/11/16 0043 02/11/16 0707  NA 142 143 144  K 3.9 3.8 4.0  CL 109 109 114*  CO2 17*  --  22  BUN 11 12 5*  CREATININE 1.06* 1.20* 0.76  CALCIUM 9.8  --  8.2*  PROT 7.2  --   --   BILITOT 0.4  --   --   ALKPHOS 73  --   --   ALT 18  --   --   AST 27  --   --   GLUCOSE 97 96 87    UDS negative   Imaging/Diagnostic Tests: Ct Head Wo Contrast  02/11/2016  CLINICAL DATA:  Suicide attempt. EXAM: CT HEAD WITHOUT CONTRAST TECHNIQUE: Contiguous axial images were obtained from the base of the skull through the vertex without intravenous contrast. COMPARISON:  06/26/2015 FINDINGS: There is no intracranial hemorrhage, mass or evidence of acute infarction. There is no extra-axial fluid collection. Gray matter and white matter appear normal. Cerebral volume is normal for age. Brainstem and posterior fossa are unremarkable. The CSF spaces appear normal. The bony structures are intact. The visible portions of the paranasal sinuses are clear. IMPRESSION: Normal brain Electronically Signed   By: Ellery Plunkaniel R Mitchell M.D.   On: 02/11/2016 05:23   [facility]g [facility]  1 View  02/11/2016  CLINICAL DATA:  Suicide attempt EXAM: PORTABLE CHEST 1 VIEW COMPARISON:  12/27/2015 FINDINGS: A single AP portable view of the chest demonstrates no focal airspace consolidation or alveolar edema. The lungs are grossly clear. There is no large effusion or pneumothorax. Cardiac and mediastinal contours appear unremarkable. IMPRESSION: No active disease. Electronically Signed   By: Ellery Plunk M.D.   On: 02/11/2016 00:58    Arvilla Market, DO 02/12/2016, 7:15 AM PGY-1, [place] [facility] FPTS Intern pager: [phone], text pages welcome

## 2016-02-12 NOTE — Discharge Summary (Signed)
[facility] Discharge Summary  Patient name: Mia Johnson Medical record number: [id number] Date of birth: 11-Aug-1996 Age: 21 y.o. Gender: female Date of Admission: 02/11/2016  Date of Discharge: 02/15/2016 Admitting Physician: Blane Ohara McDiarmid, MD  Primary Care Provider: Tawanna Sat, MD Consultants: Psych  Indication for Hospitalization: AKI and Lactic Acidosis 2/2 to Suicide Attempt by Intentional Ingestion   Discharge Diagnoses/Problem List:  Patient Active Problem List   Diagnosis Date Noted  . Suicidal ideation 02/11/2016  . Episode of recurrent major depressive disorder ([place])   . AKI (acute kidney injury) ([place])   . Lactic acid acidosis   . Suicide attempt ([place])   . Depression   . MDD (major depressive disorder), recurrent severe, without psychosis ([place]) 09/27/2015  . Severe episode of recurrent major depressive disorder, without psychotic features ([place])   . Severe recurrent major depression without psychotic features ([place]) 09/24/2015  . Overdose 09/24/2015  . Alcohol abuse 09/24/2015  . Tonsil stone 09/12/2015  . Vaginal discharge 08/10/2015  . Recurrent abdominal pain 12/22/2014  . Right ovarian cyst 11/10/2014  . UTI (urinary tract infection) 11/10/2014  . Thumb pain 10/12/2014  . Chest pain 10/12/2014  . Dysuria 08/12/2014  . Rotator cuff tendonitis 08/16/2013  . ETOH abuse 04/07/2013  . PTSD (post-traumatic stress disorder) 09/17/2012  . Insomnia 07/17/2012  . Adolescent problems 03/19/2012  . Nonallopathic lesion of lumbosacral region 11/15/2011  . Adolescent depression 09/18/2011  . Genu valgus, congenital 08/28/2011  . Cyclic vomiting syndrome 12/26/2010  . Migraine 10/09/2010  . PES PLANUS 04/25/2010  . IRREGULAR MENSES 03/06/2010  . UNEQUAL LEG LENGTH 07/13/2009  . FOOT DEFORMITY, CONGENITAL 05/11/2007  . Allergic rhinitis 01/01/2007    Disposition: Inpatient behavioral health   Discharge Condition: Improved    Discharge Exam:  General: female lying in bed in NAD, non-toxic, sleeping but arousable  Cardiovascular: RRR.  Respiratory:CTAB. Normal WOB. No respiratory disress.  Abdomen: soft, NTND  Extremities: No LE edema  Psych: Mildly anxious but alert and interactive.   Brief Hospital Course:  Mia Johnson is 20 y.o. female with PMH of depression with previous suicide attempt who presented after intentional ingestion of unknown household cleaner and mouthwash with intent to end her life.   AKI: Cr elevated to 1.20 (bl 0.7) at admission. Thought to be secondary to ingestion but resolved quickly with IVFs so likely pre-renal related to dehydration from emesis.   Lactic Acidosis: Initially elevated to 5.98. Normalized quickly with IVF treatment.   Suicide Attempt: Case discussed with poison control who recommended monitoring for GI symptoms. Patient with no further GI symptoms after admission and very stable. Electrolytes, CXR, and EKG were grossly normal at admission. Poison Control signed off. Psych was consulted. Patient met criteria for inpatient behavioral health placement; patient was agreeable to treatment. She has previously been prescribed medications for depression but has not been taking them. Psych SW met with patient during her hospitalization.   EtOH abuse: At admission mom reported concern of patient drinking significant amount of EtOH. Ethanol level 174 at admission. CIWA protocol initiated and scores of 0 throughout hospitalization.   Issues for Follow Up:  1. Continued treatment for psychiatric conditions. Will likely need initiation of medical therapy in the future.  2. Patient will need follow up at [facility] after discharge from [facility].   Significant Procedures: None   Significant Labs and Imaging:   Recent Labs Lab 02/11/16 0029 02/11/16 0043  WBC 8.8  --   HGB 13.5  15.3*  HCT 40.5 45.0  PLT 304  --     Recent Labs Lab 02/11/16 0029  02/11/16 0043 02/11/16 0707 02/12/16 0805  NA 142 143 144 140  K 3.9 3.8 4.0 3.7  CL 109 109 114* 109  CO2 17*  --  22 21*  GLUCOSE 97 96 87 85  BUN 11 12 5* <5*  CREATININE 1.06* 1.20* 0.76 0.85  CALCIUM 9.8  --  8.2* 8.9  MG 2.1  --   --   --   ALKPHOS 73  --   --   --   AST 27  --   --   --   ALT 18  --   --   --   ALBUMIN 4.3  --   --   --     Ct Head Wo Contrast  02/11/2016  CLINICAL DATA:  Suicide attempt. EXAM: CT HEAD WITHOUT CONTRAST TECHNIQUE: Contiguous axial images were obtained from the base of the skull through the vertex without intravenous contrast. COMPARISON:  06/26/2015 FINDINGS: There is no intracranial hemorrhage, mass or evidence of acute infarction. There is no extra-axial fluid collection. Gray matter and white matter appear normal. Cerebral volume is normal for age. Brainstem and posterior fossa are unremarkable. The CSF spaces appear normal. The bony structures are intact. The visible portions of the paranasal sinuses are clear. IMPRESSION: Normal brain Electronically Signed   By: Andreas Newport M.D.   On: 02/11/2016 05:23   Dg Chest Port 1 View  02/11/2016  CLINICAL DATA:  Suicide attempt EXAM: PORTABLE CHEST 1 VIEW COMPARISON:  12/27/2015 FINDINGS: A single AP portable view of the chest demonstrates no focal airspace consolidation or alveolar edema. The lungs are grossly clear. There is no large effusion or pneumothorax. Cardiac and mediastinal contours appear unremarkable. IMPRESSION: No active disease. Electronically Signed   By: Andreas Newport M.D.   On: 02/11/2016 00:58    Results/Tests Pending at Time of Discharge: None   Discharge Medications:    Medication List    ASK your doctor about these medications        albuterol 108 (90 Base) MCG/ACT inhaler  Commonly known as:  PROVENTIL HFA;VENTOLIN HFA  Inhale 2 puffs into the lungs every 6 (six) hours as needed for wheezing or shortness of breath.     EPINEPHrine 0.3 mg/0.3 mL Soaj injection   Commonly known as:  EPIPEN 2-PAK  Inject 0.3 mLs (0.3 mg total) into the muscle once.     FLUoxetine 10 MG capsule  Commonly known as:  PROZAC  Take 1 capsule (10 mg total) by mouth daily.     FLUoxetine 10 MG capsule  Commonly known as:  PROZAC  Take 1 capsule (10 mg total) by mouth daily.     hydrOXYzine 25 MG tablet  Commonly known as:  ATARAX/VISTARIL  Take 1 tablet (25 mg total) by mouth 3 (three) times daily as needed for anxiety or nausea.     ketoconazole 2 % cream  Commonly known as:  NIZORAL  Apply 1 application topically daily.     mirtazapine 15 MG tablet  Commonly known as:  REMERON  Take 1 tablet (15 mg total) by mouth at bedtime.     pantoprazole 20 MG tablet  Commonly known as:  PROTONIX  Take 1 tablet (20 mg total) by mouth daily.        Discharge Instructions: Please refer to Patient Instructions section of EMR for full details.  Patient was counseled important  signs and symptoms that should prompt return to medical care, changes in medications, dietary instructions, activity restrictions, and follow up appointments.   Follow-Up Appointments:   Nicolette Bang, DO 02/13/2016, 9:28 AM PGY-1, [place]
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
## 2016-02-12 NOTE — Care Management Note (Signed)
Case Management Note  Patient Details  Name: Mia SnideSamantha [place] MRN: [id number] Date of Birth: 05/03/1996  Subjective/Objective:                 Patient admitted with suicidal ideation. [facility]. Psych eval pending.    Action/Plan:  CM placed CSW consult for disposition planning 2/2 report from bedside RN that patient's mother feels unsafe bringing her back into the home as she feels the patient has homicidal tendencies toward her other daughter.  CM will continue to follow for dispo planning as needed. Expected Discharge Date:                  Expected Discharge Plan:   (pending psych eval)  In-House Referral:  Clinical Social Work  Discharge planning Services  CM Consult  Post Acute Care Choice:    Choice offered to:     DME Arranged:    DME Agency:     HH Arranged:    HH Agency:     Status of Service:  In process, will continue to follow  Medicare Important Message Given:    Date Medicare IM Given:    Medicare IM give by:    Date Additional Medicare IM Given:    Additional Medicare Important Message give by:     If discussed at Long Length of Stay Meetings, dates discussed:    Additional Comments:  Lawerance SabalDebbie Cassandria Drew, RN 02/12/2016, 12:02 PM

## 2016-02-12 NOTE — Progress Notes (Signed)
Chaplain presented to the patient's room to provide spiritual care support.  The patient was sleeping at the time of this visit, a sitter was present. Chaplain will follow up at a later time. Chaplain Janell QuietAudrey Macalister Arnaud [phone]

## 2016-02-12 NOTE — Progress Notes (Signed)
Initial Nutrition Assessment  DOCUMENTATION CODES:   Not applicable  INTERVENTION:  - Continue to monitor nutritional needs  NUTRITION DIAGNOSIS:   Inadequate oral intake related to poor appetite as evidenced by estimated needs.  GOAL:   Patient will meet greater than or equal to 90% of their needs  MONITOR:   PO intake, Labs, Weight trends, Skin, I & O's  REASON FOR ASSESSMENT:   Malnutrition Screening Tool    ASSESSMENT:   20 y.o. female presenting with suicide attempt . PMH is significant for depression and previous suicide attempt. Patient ingested unknown household cleaner, [employer] optic mouthwash, and unknown pills. ED providers discussed case with Poison Control who stated anticipated side effects would be GI (nausea, vomiting and diarrhea) and recommended medical observation. Vital signs stable at admission. Electrolytes WNL. EKG without acute changes. Tylenol and salicylate levels negative. Ethanol level elevated to 174. CXR without acute process. Previous SI attempt with ingestion of trazodone.   Pt seen for MST. Pt with BMI categorized as healthy. Pt has not experienced any weight loss. Per chart, pt weighed 136 lbs on 12/27/2015 and currently weighs 141 lbs. Per chart, pt ate 50% of lunch today.  Diet recall obtained from pt. Pt states eating BID. Pt reports poor appetite PTA but denies N/V or abdominal pain associated with intake. Pt denies use of nutritional supplements at home. Pt does not wish to receive nutritional supplements at this time. Will continue to monitor for additional nutritional needs. Poor appetite seems to be associated with PMH of depression and suicide attempt.   NFPE: No muscle wasting, no fat depletion, no edema.   Labs reviewed; BUN <5 mg/dl.  Meds reviewed.   Diet Order:  Diet Heart Room service appropriate?: Yes; Fluid consistency:: Thin  Skin:  Wound (see comment) (Incision on left wrist)  Last BM:  4/8  Height:   Ht Readings  from Last 1 Encounters:  02/11/16 5\' 9"  (1.753 m)    Weight:   Wt Readings from Last 1 Encounters:  02/11/16 141 lb 6.4 oz (64.139 kg)    Ideal Body Weight:  65.9 kg  BMI:  Body mass index is 20.87 kg/(m^2).  Estimated Nutritional Needs:   Kcal:  1500-1700 kcals (24-26 kcals/kg)  Protein:  65-75 g (1 g/kg)  Fluid:  1.5-1.7 L  EDUCATION NEEDS:   No education needs identified at this time  Beryle QuantMeredith Mariaceleste Herrera, MS [facility] Dietetic Intern Pager [phone]

## 2016-02-12 NOTE — Progress Notes (Signed)
Inpatient psych referral can be made once patient is medically stable.   Mia Johnson LCSWA [phone]

## 2016-02-13 MED ORDER — ACETAMINOPHEN 325 MG PO TABS
650.0000 mg | ORAL_TABLET | Freq: Four times a day (QID) | ORAL | Status: DC | PRN
  Administered 2016-02-13: 650 mg via ORAL
  Filled 2016-02-13: qty 2

## 2016-02-13 NOTE — Progress Notes (Signed)
Spoke with mother who requested to talk to psychiatrist and psych social worker.  Will attempt to notify.

## 2016-02-13 NOTE — Progress Notes (Signed)
Patient is medically stable for inpatient psych placement.   Marcy Sirenatherine Damarri Rampy, D.O. 02/13/2016, 10:54 AM PGY-1, [facility]

## 2016-02-13 NOTE — Progress Notes (Signed)
Family Medicine Teaching Service Daily Progress Note Intern Pager: [phone]  Patient name: Mia Johnson Medical record number: [id number] Date of birth: [birth year] Age: 20 y.o. Gender: female  Primary Care Provider: Tawni Carnes, MD Consultants: Psych  Code Status: FULL   Pt Overview and Major Events to Date:  4/8: patient admitted for AKI and lactic acidosis after intentional ingestion   Assessment and Plan: Mia Johnson is a 20 y.o. female presenting with suicide attempt . PMH is significant for depression and previous suicide attempt.   Suicide Attempt: Patient ingested unknown household cleaner, [employer] optic mouthwash, and unknown pills. Electrolytes and vital signs have remained stable. -psych consulted, recommended inpatient psych placement  -called SW to confirm that patient is medically stable  -suicide precautions with sitter at bedside    AKI, Resolved: Cr elevated to 1.20 at admission(bl 0.7). Now back at baseline. Likely 2/2 prerenal injury with emesis due to quick return to normal vs. to recent ingestion. -SLIV   Lactic Acidosis, resolved: Initially elevated to 5.98. Now 1.3 after IVFs.   Depression/Anxiety: With past history of suicide attempt with trazadone. Mother reports patient has not been taking any psych medications.  -psych consult in AM, will defer to them for management   History of EtOH abuse: Mom reports patient still drinking and Ethanol level 174 at admission.  -CIWA protocol ordered: scores have been 0   FEN/GI: Heart Healthy Diet, KVO  Prophylaxis: SCDs   Disposition: Pending psych recs   Subjective:  No complaints this morning. Denies pain. No N/V. Tolerating PO. Agreeable to inpatient psych placement.   Objective: Temp:  [98.1 F (36.7 C)-98.8 F (37.1 C)] 98.1 F (36.7 C) (04/11 0428) Pulse Rate:  [66-81] 81 (04/11 0428) Resp:  [16-18] 16 (04/11 0428) BP: (118-130)/(52-68) 125/68 mmHg (04/11 0428) SpO2:  [99 %-100 %]  99 % (04/11 0428) Physical Exam: General: female lying in bed in NAD, non-toxic, sleeping but arousable  Cardiovascular: RRR.  Respiratory:  Normal WOB. No respiratory disress.  Abdomen: soft, NTND  Extremities: No LE edema  Psych: Flat affect   Laboratory:  Recent Labs Lab 02/11/16 0029 02/11/16 0043  WBC 8.8  --   HGB 13.5 15.3*  HCT 40.5 45.0  PLT 304  --     Recent Labs Lab 02/11/16 0029 02/11/16 0043 02/11/16 0707 02/12/16 0805  NA 142 143 144 140  K 3.9 3.8 4.0 3.7  CL 109 109 114* 109  CO2 17*  --  22 21*  BUN 11 12 5* <5*  CREATININE 1.06* 1.20* 0.76 0.85  CALCIUM 9.8  --  8.2* 8.9  PROT 7.2  --   --   --   BILITOT 0.4  --   --   --   ALKPHOS 73  --   --   --   ALT 18  --   --   --   AST 27  --   --   --   GLUCOSE 97 96 87 85    UDS negative   Imaging/Diagnostic Tests: Ct Head Wo Contrast  02/11/2016  CLINICAL DATA:  Suicide attempt. EXAM: CT HEAD WITHOUT CONTRAST TECHNIQUE: Contiguous axial images were obtained from the base of the skull through the vertex without intravenous contrast. COMPARISON:  06/26/2015 FINDINGS: There is no intracranial hemorrhage, mass or evidence of acute infarction. There is no extra-axial fluid collection. Gray matter and white matter appear normal. Cerebral volume is normal for age. Brainstem and posterior fossa are unremarkable. The CSF spaces appear  normal. The bony structures are intact. The visible portions of the paranasal sinuses are clear. IMPRESSION: Normal brain Electronically Signed   By: Daniel R Mitchell M.D.   On: 04/09/2Ellery Plunk017 05:23   Dg Chest Port 1 View  02/11/2016  CLINICAL DATA:  Suicide attempt EXAM: PORTABLE CHEST 1 VIEW COMPARISON:  12/27/2015 FINDINGS: A single AP portable view of the chest demonstrates no focal airspace consolidation or alveolar edema. The lungs are grossly clear. There is no large effusion or pneumothorax. Cardiac and mediastinal contours appear unremarkable. IMPRESSION: No active disease.  Electronically Signed   By: Ellery Plunkaniel R Mitchell M.D.   On: 02/11/2016 00:58    Arvilla Marketatherine Lauren Wallace, DO 02/13/2016, 8:56 AM PGY-1, [facility] FPTS Intern pager: [phone], text pages welcome

## 2016-02-14 MED ORDER — ALUM & MAG HYDROXIDE-SIMETH 200-200-20 MG/5ML PO SUSP
15.0000 mL | Freq: Four times a day (QID) | ORAL | Status: DC | PRN
  Administered 2016-02-14 – 2016-02-15 (×2): 15 mL via ORAL
  Filled 2016-02-14 (×2): qty 30
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
## 2016-02-14 NOTE — Clinical Social Work Psych Note (Signed)
Psych CSW continuing to seek inpatient psychiatric hospitalization placement for patient.  [facility]- under review  Vickii PennaGina Tatum Corl, LCSW [phone]  5N1-9; 2S 15-16 and Hospital Psychiatric Service Line Licensed Clinical Social Worker

## 2016-02-14 NOTE — Clinical Social Work Psych Assess (Signed)
Clinical Social Work Nature conservation officer  Clinical Social Worker:  Elvis Coil Date/Time:  02/14/2016, 11:27 AM Referred By:  Physician Date Referred:  02/14/16 Reason for Referral:  Behavioral Health Issues, Crisis Intervention (intentional suicide attempt via overdose and wrist cutting)   Presenting Symptoms/Problems "I am here because I tried to commit suicide"   Abuse/Neglect/Trauma History  Denies History  Psychiatric History  Outpatient Treatment, Inpatient/Hospitalization (patient has history with "[facility]" - therapist and for med mgmt though she cannot remember exact name of provider or last date seen (approximately 1 year ago)- Last inpatient admission was approximately 6 months ago status post overdose attempt ) Psychiatric Medication:  Patient denies hx of psychiatric medications   Current Mental Health Hospitalizations/Previous Mental Health History:  Patient reports hx of outpatient follow-up with "[facility]" though patient cannot recall the full name of the provider or office.  Patient states that she hasn't seen [facility] in over a year.  Patient also reports hx approx 6 months admission to [place] status post overdose attempt (sleeping pills).  Patient reports no hx prior to this.  Emotional Health/Current Symptoms Suicide/Self Harm: Has a Plan for Suicide, Suicidal Ideation (ex. "I can't take anymore, I wish I could disappear"), Suicide Attempt in the Past (date/description) (hx-3mo ago- overdose attempt upon admission-  ingestion/wrist cut)  Psychotic/Dissociative Symptoms  None Reported  Attention/Behavioral Symptoms Restless (patient continued to sit up in bed/lay down- sit up/lay down)  Cognitive Impairment Orientation - Place, Orientation - Self, Orientation - Situation, Orientation - Time, Poor Judgement, Poor/Impaired Decision-Making  Mood and Adjustment Flat, Anxious  Stress, Anxiety, Trauma, Any Recent Loss/Stressor Anxiety Anxiety  (frequency):  Patient states "it has been hard to find a job"  Substance Abuse/Use  None SBIRT Completed (please refer for detailed history): N/A Self-reported Substance Use (last use and frequency):  Patient denies use of illicit drugs  Urinary Drug Screen Completed: Yes (UDS- WNL; BAL 174)  Environment/Housing/Living Arrangement With Biological Parent(s) (mother and older sister)  Financial Medicaid  Patient's Strengths and Goals Patient states she has just landed a job with the [employer] 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  Patient states she is here because she attempted suicide.  Patient states she had an argument with her sister, of whom she lives with, and afterward locked herself in the bathroom and ingested "whatever I could find".  She then admits to cutting her wrist.  Patient states she has attempted [facility] once before approximately 6 months [facility].    Patient reports finishing high school and not wanting to go to college until she has "reliable transportation".  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  Patient's goal is to save up enough money for car to be more independent instead of "depending on my mama".    Patient states her anchor is her mother who she considers her main support and her best friend. Patient states that her relationship with her sister is also strong though she admits that they "fight like sisters".  Patient states that she often lashes out at her sister because they have "such a strong bond".    Patient  is voluntary and reports being "ready" to go to [facility].  Patient has been referred to surrounding facilities and is currently medically cleared  and awaiting bed placement.    Patient denies SI, HI, AVHD at this time.  Patient exhibited moderate anxiety during assessment as seen in her restless behaviors.  Patient would sit up, answer questions, lay down, pull the covers up to her neck and sit up again.  This behavior was repeated multiple times and was continuous throughout the assessment.  Patient is realistic regarding the level of care needed at time of discharge and is ready to have outpatient follow-up set up in the community as well after being discharged from [facility] unit.  Patient feels both inpatient and outpatient care is needful at this time.   Disposition Inpatient Referral Made [facility])

## 2016-02-14 NOTE — Progress Notes (Signed)
Family Medicine Teaching Service Daily Progress Note Intern Pager: [phone]  Patient name: Mia Johnson Medical record number: [id number] Date of birth: 1996/05/31 Age: 20 y.o. Gender: female  Primary Care Provider: Tawni Carnes, MD Consultants: Psych  Code Status: FULL   Pt Overview and Major Events to Date:  4/8: patient admitted for AKI and lactic acidosis after intentional ingestion  4/9: Patient medically stable  4/10: Psych recommended inpatient psych placement   Assessment and Plan: Mia Johnson is a 20 y.o. female presenting with suicide attempt . PMH is significant for depression and previous suicide attempt.   Suicide Attempt: Patient ingested unknown household cleaner, [employer] optic mouthwash, and unknown pills. Electrolytes and vital signs have remained stable. -psych consulted, recommended inpatient psych placement  -patient is medically stable, awaiting BH bed placement  -suicide precautions with sitter at bedside    AKI, Resolved: Cr elevated to 1.20 at admission(bl 0.7). Now back at baseline. Likely 2/2 prerenal injury with emesis due to quick return to normal vs. to recent ingestion. -SLIV   Lactic Acidosis, resolved: Initially elevated to 5.98. Now 1.3 after IVFs.   Depression/Anxiety: With past history of suicide attempt with trazadone. Mother reports patient has not been taking any psych medications.  -management per psych   History of EtOH abuse: Mom reports patient still drinking and Ethanol level 174 at admission.  -CIWA protocol ordered: scores have been 0; will discontinue as patient has been here for over 72 hours without signs/symptoms of withdrawal   FEN/GI: Heart Healthy Diet, no IV access  Prophylaxis: SCDs   Disposition: Pending psych recs   Subjective:  Patient more awake and conversant this morning. Wants to make sure she will be out of [facility] by end of April as she has a job she wants to get to. States having a job has been an  issue for her in the past and she wants to work on this.   Objective: Temp:  [98.2 F (36.8 C)-98.8 F (37.1 C)] 98.2 F (36.8 C) (04/12 0511) Pulse Rate:  [77-87] 79 (04/12 0511) Resp:  [18-20] 18 (04/12 0511) BP: (119-127)/(62-85) 119/64 mmHg (04/12 0511) SpO2:  [100 %] 100 % (04/12 0511) Physical Exam: General: female lying in bed in NAD, non-toxic Cardiovascular: RRR.  Respiratory:  Normal WOB. No respiratory disress.  Abdomen: soft, NTND  Extremities: No LE edema  Psych: More alert and interactive.   Laboratory:  Recent Labs Lab 02/11/16 0029 02/11/16 0043  WBC 8.8  --   HGB 13.5 15.3*  HCT 40.5 45.0  PLT 304  --     Recent Labs Lab 02/11/16 0029 02/11/16 0043 02/11/16 0707 02/12/16 0805  NA 142 143 144 140  K 3.9 3.8 4.0 3.7  CL 109 109 114* 109  CO2 17*  --  22 21*  BUN 11 12 5* <5*  CREATININE 1.06* 1.20* 0.76 0.85  CALCIUM 9.8  --  8.2* 8.9  PROT 7.2  --   --   --   BILITOT 0.4  --   --   --   ALKPHOS 73  --   --   --   ALT 18  --   --   --   AST 27  --   --   --   GLUCOSE 97 96 87 85    UDS negative   Imaging/Diagnostic Tests: Ct Head Wo Contrast  02/11/2016  CLINICAL DATA:  Suicide attempt. EXAM: CT HEAD WITHOUT CONTRAST TECHNIQUE: Contiguous axial images were obtained from the  base of the skull through the vertex without intravenous contrast. COMPARISON:  06/26/2015 FINDINGS: There is no intracranial hemorrhage, mass or evidence of acute infarction. There is no extra-axial fluid collection. Gray matter and white matter appear normal. Cerebral volume is normal for age. Brainstem and posterior fossa are unremarkable. The CSF spaces appear normal. The bony structures are intact. The visible portions of the paranasal sinuses are clear. IMPRESSION: Normal brain Electronically Signed   By: Ellery Plunk M.D.   On: 02/11/2016 05:23   Dg Chest Port 1 View  02/11/2016  CLINICAL DATA:  Suicide attempt EXAM: PORTABLE CHEST 1 VIEW COMPARISON:  12/27/2015  FINDINGS: A single AP portable view of the chest demonstrates no focal airspace consolidation or alveolar edema. The lungs are grossly clear. There is no large effusion or pneumothorax. Cardiac and mediastinal contours appear unremarkable. IMPRESSION: No active disease. Electronically Signed   By: Ellery Plunk M.D.   On: 02/11/2016 00:58    Arvilla Market, DO 02/14/2016, 9:45 AM PGY-1, [facility] FPTS Intern pager: [phone], text pages welcome

## 2016-02-15 ENCOUNTER — Inpatient Hospital Stay
Admission: RE | Admit: 2016-02-15 | Discharge: 2016-02-19 | DRG: 885 | Disposition: A | Discharge status: Home / Self Care | Payer: Medicaid Other | Source: Intra-hospital | Attending: Psychiatry | Admitting: Psychiatry

## 2016-02-15 DIAGNOSIS — F1010 Alcohol abuse, uncomplicated: Secondary | ICD-10-CM | POA: Diagnosis present

## 2016-02-15 DIAGNOSIS — K219 Gastro-esophageal reflux disease without esophagitis: Secondary | ICD-10-CM | POA: Diagnosis present

## 2016-02-15 DIAGNOSIS — F4310 Post-traumatic stress disorder, unspecified: Secondary | ICD-10-CM | POA: Diagnosis present

## 2016-02-15 DIAGNOSIS — Z62810 Personal history of physical and sexual abuse in childhood: Secondary | ICD-10-CM | POA: Diagnosis present

## 2016-02-15 DIAGNOSIS — F1020 Alcohol dependence, uncomplicated: Secondary | ICD-10-CM | POA: Diagnosis present

## 2016-02-15 DIAGNOSIS — R45851 Suicidal ideations: Secondary | ICD-10-CM | POA: Diagnosis present

## 2016-02-15 DIAGNOSIS — F17200 Nicotine dependence, unspecified, uncomplicated: Secondary | ICD-10-CM | POA: Diagnosis present

## 2016-02-15 DIAGNOSIS — Z915 Personal history of self-harm: Secondary | ICD-10-CM | POA: Diagnosis not present

## 2016-02-15 DIAGNOSIS — F332 Major depressive disorder, recurrent severe without psychotic features: Secondary | ICD-10-CM | POA: Diagnosis present

## 2016-02-15 DIAGNOSIS — F1210 Cannabis abuse, uncomplicated: Secondary | ICD-10-CM | POA: Diagnosis present

## 2016-02-15 DIAGNOSIS — T1491XA Suicide attempt, initial encounter: Secondary | ICD-10-CM | POA: Diagnosis present

## 2016-02-15 DIAGNOSIS — F17210 Nicotine dependence, cigarettes, uncomplicated: Secondary | ICD-10-CM | POA: Diagnosis present

## 2016-02-15 DIAGNOSIS — F329 Major depressive disorder, single episode, unspecified: Secondary | ICD-10-CM | POA: Diagnosis present

## 2016-02-15 MED ORDER — MAGNESIUM HYDROXIDE 400 MG/5ML PO SUSP: 30.0000 mL | Freq: Every day | ORAL | Status: DC | PRN

## 2016-02-15 MED ORDER — FLUOXETINE HCL 10 MG PO CAPS
10.0000 mg | ORAL_CAPSULE | Freq: Every day | ORAL | Status: DC
  Administered 2016-02-15 – 2016-02-19 (×5): 10 mg via ORAL
  Filled 2016-02-15 (×5): qty 1

## 2016-02-15 MED ORDER — ACETAMINOPHEN 325 MG PO TABS: 650.0000 mg | ORAL_TABLET | Freq: Four times a day (QID) | ORAL | Status: DC | PRN

## 2016-02-15 MED ORDER — ALBUTEROL SULFATE HFA 108 (90 BASE) MCG/ACT IN AERS
2.0000 | INHALATION_SPRAY | RESPIRATORY_TRACT | Status: DC | PRN
  Filled 2016-02-15: qty 6.7

## 2016-02-15 MED ORDER — PANTOPRAZOLE SODIUM 40 MG PO TBEC
40.0000 mg | DELAYED_RELEASE_TABLET | Freq: Every day | ORAL | Status: DC
  Administered 2016-02-15 – 2016-02-19 (×5): 40 mg via ORAL
  Filled 2016-02-15 (×5): qty 1

## 2016-02-15 MED ORDER — ALUM & MAG HYDROXIDE-SIMETH 200-200-20 MG/5ML PO SUSP: 30.0000 mL | ORAL | Status: DC | PRN

## 2016-02-15 MED ORDER — MIRTAZAPINE 15 MG PO TABS
15.0000 mg | ORAL_TABLET | Freq: Every day | ORAL | Status: DC
  Administered 2016-02-15 – 2016-02-18 (×4): 15 mg via ORAL
  Filled 2016-02-15 (×4): qty 1

## 2016-02-15 NOTE — Plan of Care (Signed)
Problem: Ineffective individual coping Goal: STG: Patient will remain free from self harm Outcome: Progressing Mia Johnson suicidal ideations

## 2016-02-15 NOTE — Tx Team (Signed)
Initial Interdisciplinary Treatment Plan   PATIENT STRESSORS: Financial difficulties Substance abuse Traumatic event  Fight with Sister   PATIENT STRENGTHS: Ability for insight Capable of independent living Communication skills General fund of knowledge Supportive family/friends   PROBLEM LIST: Problem List/Patient Goals Date to be addressed Date deferred Reason deferred Estimated date of resolution  Depression 02/15/16     Suicide 02/15/16     Asthma 02/15/16     Substance Abuse 02/15/16                                    DISCHARGE CRITERIA:  Ability to meet basic life and health needs Adequate post-discharge living arrangements Improved stabilization in mood, thinking, and/or behavior Safe-care adequate arrangements made  PRELIMINARY DISCHARGE PLAN: Outpatient therapy Return to previous living arrangement Return to previous work or school arrangements  PATIENT/FAMIILY INVOLVEMENT: This treatment plan has been presented to and reviewed with the patient, Mia Johnson, and/or family member, .  The patient and family have been given the opportunity to ask questions and make suggestions.  Jontavius Rabalais A [name] 02/15/2016, 6:17 PM

## 2016-02-15 NOTE — Consult Note (Signed)
Mia Braatz EdD 

## 2016-02-15 NOTE — Clinical Social Work Psych Note (Signed)
Psych CSW was notified that patient was accepted to [facility] bed assignment TBD by Dr Jetty DuhamelPucilowuka.  Voluntary form signed and faxed to [facility].  Transportation: [facility]- will be arranged after bed assigned (projected 1-2pm)  Vickii PennaGina Leonila Speranza, LCSW [phone]  5N1-9; 2S 15-16 and Hospital Psychiatric Service Line Licensed Clinical Social Worker

## 2016-02-15 NOTE — Progress Notes (Signed)
20 year old female admitted with depression and SI.  Received to unit in scrubs.  Affect blunted although smiles readily upon approach.  Verbalized that she was suicidal but she is no longer suicidal and that she just needs to get back on her medications.  States that she has been off her medications for a year. Further states that she has problems controlling her anger and needs to develop some coping skills and sometimes have blackouts from getting so angry.   Body search and skin assessment performed.  No contraband found.  Multiple tattoos noted to torso, back and both arms.  Small lacerations noted to left wrist.  Tegaderm clean dry and intact.

## 2016-02-15 NOTE — BH Assessment (Signed)
Patient has been accepted to [facility].  Accepting physician is Dr. Jennet MaduroPucilowska.  Attending Physician will be Dr. Jennet MaduroPucilowska.  Patient has been assigned to room 320, by [facility] Charge Nurse Quail CreekPhyllis.  Call report to [phone].  Representative/Transfer Coordinator is Lenore Manneralvin  [facility] Staff Almira Coaster(Gina I., Social Worker) made aware of acceptance.

## 2016-02-15 NOTE — Progress Notes (Signed)
Patient is discharged from room [id number] and transferred to [facility] at this time. Alert and in stable condition. IV site d/c'd. Report given to receiving nurse Leonia ReaderPhyllis Cobb, RN and all questions answered. Ambulate out of unit with all belongings at site and a representative from [facility].

## 2016-02-15 NOTE — Progress Notes (Signed)
Nutrition Follow-up  DOCUMENTATION CODES:   Not applicable  INTERVENTION:  - Continue to monitor for nutritional needs.   NUTRITION DIAGNOSIS:   Inadequate oral intake related to poor appetite as evidenced by estimated needs.-ongoing, progressing minimally  GOAL:   Patient will meet greater than or equal to 90% of their needs -unmet  MONITOR:   PO intake, Labs, Weight trends, Skin, I & O's  ASSESSMENT:   20 y.o. female presenting with suicide attempt . PMH is significant for depression and previous suicide attempt. Patient ingested unknown household cleaner, [employer] optic mouthwash, and unknown pills. ED providers discussed case with Poison Control who stated anticipated side effects would be GI (nausea, vomiting and diarrhea) and recommended medical observation. Vital signs stable at admission. Electrolytes WNL. EKG without acute changes. Tylenol and salicylate levels negative. Ethanol level elevated to 174. CXR without acute process. Previous SI attempt with ingestion of trazodone.   Pt seen for follow up. Pt asleep at time of visit. Did not wake pt. RN busy with another pt. Spoke to sitter who said pt ate 25% of breakfast. Pt ate a small amount of eggs and sausage and did not touch her pancakes. Per chart, pt has 25-80% meal completion. Pt does not want supplements. PO better than initial visit. Pt waiting on bed at facility. Pt not eating d/t poor appetite likely d/t depression. Will continue to monitor nutritional needs as long as pt is here but interventions likely not acceptable to pt at this time.  Labs reviewed; BUN <5 mg/dl Meds reviewed.  Diet Order:  Diet Heart Room service appropriate?: Yes; Fluid consistency:: Thin  Skin:  Wound (see comment) (Incision on left wrist)  Last BM:  4/11  Height:   Ht Readings from Last 1 Encounters:  02/11/16 5\' 9"  (1.753 m)    Weight:   Wt Readings from Last 1 Encounters:  02/11/16 141 lb 6.4 oz (64.139 kg)    Ideal Body  Weight:  65.9 kg  BMI:  Body mass index is 20.87 kg/(m^2).  Estimated Nutritional Needs:   Kcal:  1500-1700 kcals (24-26 kcals/kg)  Protein:  65-75 g (1 g/kg)  Fluid:  1.5-1.7 L  EDUCATION NEEDS:   No education needs identified at this time  Beryle QuantMeredith Rose Hippler, MS [facility] Dietetic Intern Pager [phone]

## 2016-02-16 ENCOUNTER — Encounter: Payer: Self-pay | Admitting: Psychiatry

## 2016-02-16 DIAGNOSIS — F1210 Cannabis abuse, uncomplicated: Secondary | ICD-10-CM | POA: Diagnosis present

## 2016-02-16 DIAGNOSIS — F17200 Nicotine dependence, unspecified, uncomplicated: Secondary | ICD-10-CM | POA: Diagnosis present

## 2016-02-16 NOTE — Progress Notes (Signed)
D:  Presents with sad, flat affect.  Denies SI/HI/AVH and depression.  Isolates to her room  Quiet and reserved and guarded with information.   A:  Scheduled medications given.  Support and encouragement offered.  Discussed different coping mechanisms.  Safety maintained by Q 15 minute checks. R:  Medication and group compliant.  Receptive to treatment program.
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
## 2016-02-16 NOTE — [facility] Group Notes (Signed)
[facility] LCSW Aftercare Discharge Planning Group Note   02/16/2016 10:46 AM  Participation Quality:  Did not attend   Adrean Findlay L Kanae Ignatowski MSW, LCSWA    

## 2016-02-16 NOTE — Plan of Care (Signed)
Problem: Alteration in mood Goal: STG-Patient is able to discuss feelings and issues (Patient is able to discuss feelings and issues leading to depression)  Outcome: Not Progressing Guarded and not forthcoming with information.

## 2016-02-16 NOTE — Plan of Care (Signed)
Problem: Ineffective individual coping Goal: LTG: Patient will report a decrease in negative feelings Outcome: Progressing Denies depression although affect is flat

## 2016-02-16 NOTE — Plan of Care (Signed)
Problem: Diagnosis: Increased Risk For Suicide Attempt Goal: STG-Patient Will Report Suicidal Feelings to Staff Outcome: Progressing Patient denies SI     
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
## 2016-02-16 NOTE — BHH Group Notes (Signed)
B[facility] Group Notes:  (Nursing/MHT/Case Management/Adjunct)  Date:  02/16/2016  Time:  2:43 AM  Type of Therapy:  Group Therapy  Participation Level:  Active  Participation Quality:  Appropriate  Affect:  Appropriate and Flat  Cognitive:  Appropriate  Insight:  Appropriate  Engagement in Group:  Engaged  Modes of Intervention:  n/a  Summary of Progress/Problems:  Mia Johnson 02/16/2016, 2:43 AM

## 2016-02-16 NOTE — [facility] Suicide Risk Assessment (Signed)
[facility]   Nursing information obtained from:    Demographic factors:    Current Mental Status:    Loss Factors:    Historical Factors:    Risk Reduction Factors:     Total Time spent with patient: 1 hour Principal Problem: Severe recurrent major depression without psychotic features [facility](HCC) Diagnosis:   Patient Active Problem List   Diagnosis Date Noted  . Tobacco use disorder [F17.200] 02/16/2016  . Cannabis use disorder, mild, abuse [F12.10] 02/16/2016  . Suicide attempt (HCC) [T14.91]   . Severe recurrent major depression without psychotic features (HCC) [F33.2] 09/24/2015  . Recurrent abdominal pain [R10.9] 12/22/2014  . Right ovarian cyst [N83.201] 11/10/2014  . Alcohol use disorder, moderate, dependence (HCC) [F10.20] 04/07/2013  . PTSD (post-traumatic stress disorder) [F43.10] 09/17/2012  . Nonallopathic lesion of lumbosacral region [M99.03] 11/15/2011  . Genu valgus, congenital [Q74.1] 08/28/2011  . Cyclic vomiting syndrome [G43.A0] 12/26/2010  . Migraine [G43.909] 10/09/2010  . PES PLANUS [M21.40] 04/25/2010  . IRREGULAR MENSES [N92.6] 03/06/2010  . UNEQUAL LEG LENGTH [M21.70] 07/13/2009  . Allergic rhinitis [J30.9] 01/01/2007   Subjective Data: Depression, anxiety, alcohol abuse.    Alcohol Use Disorder Identification Test Final Score (AUDIT): 2 The "Alcohol Use Disorders Identification Test", Guidelines for Use in Primary Care, Second Edition.  World Science writerHealth Organization [facility](WHO). Score between 0-7:  no or low risk or alcohol related problems. Score between 8-15:  moderate risk of alcohol related problems. Score between 16-19:  high risk of alcohol related problems. Score 20 or above:  warrants further diagnostic evaluation for alcohol dependence and treatment.   CLINICAL FACTORS:   Depression:   Comorbid alcohol abuse/dependence Impulsivity Alcohol/Substance Abuse/Dependencies   Musculoskeletal: Strength & Muscle Tone: within normal  limits Gait & Station: normal Patient leans: N/A  Psychiatric Specialty Exam: Review of Systems  Psychiatric/Behavioral: Positive for depression, suicidal ideas and substance abuse.  All other systems reviewed and are negative.   Blood pressure 95/51, pulse 93, temperature 98 F (36.7 C), temperature source Oral, resp. rate 16, height 5\' 9"  (1.753 m), weight 58.514 kg (129 lb), SpO2 100 %.Body mass index is 19.04 kg/(m^2).  General Appearance: Casual  Eye Contact::  Good  Speech:  Clear and Coherent  Volume:  Normal  Mood:  Angry and Anxious  Affect:  Congruent  Thought Process:  Goal Directed  Orientation:  Full (Time, Place, and Person)  Thought Content:  WDL  Suicidal Thoughts:  Yes.  with intent/plan  Homicidal Thoughts:  No  Memory:  Immediate;   Fair Recent;   Fair Remote;   Fair  Judgement:  Poor  Insight:  Lacking  Psychomotor Activity:  Normal  Concentration:  Fair  Recall:  [employer]  Fund of Knowledge:Fair  Language: Fair  Akathisia:  No  Handed:  Right  AIMS (if indicated):     Assets:  Communication Skills Desire for Improvement Financial Resources/Insurance Housing Physical Health Resilience Social Support  Sleep:  Number of Hours: 6  Cognition: WNL  ADL's:  Intact    COGNITIVE FEATURES THAT CONTRIBUTE TO RISK:  None    SUICIDE RISK:   Moderate.  PLAN OF Hospital admission, medication management, substance abuse counseling, discharge planning.  [name] is a 20 year old female with a history of PTSD, depression, mood instability and excessive drinking [facility] after suicide attempt by cutting and chemicals overdose in the context of family conflict.  1. Suicidal ideation. The patient is  able to contract for safety in the hospital. She still uncertain if she can face her sister.   2. Depression and anxiety. The patient was restarted on a combination of Remeron and low-dose Prozac that  was helpful in the past.  3. Asthma. Inhaler is available.  4. GERD. The patient has a history of abdominal pain and vomiting. She experiences some abdominal pain after ingestion. We will continue Protonix.  5. Alcohol abuse. The patient completed the detox while on medical floor. She minimizes her problems and is not interested in treatment.  6. Disposition. She will be discharged to home with family. She will follow up with her psychiatrist.    certify that inpatient services furnished can reasonably be expected to improve the patient's condition.   Kristine Linea, MD 02/16/2016, 11:07 AM

## 2016-02-16 NOTE — BHH Group Notes (Signed)
BHH LCSW Group Therapy  02/16/2016 1:54 PM  Type of Therapy: Group Therapy  Participation Level: Minimal  Participation Quality: Attentive  Affect: Flat  Cognitive:Alert   Insight: Limited  Engagement in Therapy: Limited  Modes of Intervention: Discussion, Education, Socialization and Support  Summary of Progress/Problems: Feelings around Relapse. Group members discussed the meaning of relapse and shared personal stories of relapse, how it affected them and others, and how they perceived themselves during this time. Group members were encouraged to identify triggers, warning signs and coping skills used when facing the possibility of relapse. Social supports were discussed and explored in detail. Pt attended group and stayed the entire time. Pt sat quietly and listened to other group members share.   Moksha Dorgan L Beza Steppe MSW, LCSWA  02/16/2016, 1:54 PM 

## 2016-02-16 NOTE — Progress Notes (Signed)
D: Pt denies SI/HI/AVH. Pt is pleasant and cooperative, affect flat, sad and guarded with information. .Pt is interacting with peers and staff appropriately.  A: Pt was offered support and encouragement. Pt was given scheduled medications. Pt was encouraged to attend groups. Q 15 minute checks were done for safety.  R:Pt attends groups and interacts well with peers and staff. Pt is taking medication. Pt has no complaints.Pt receptive to treatment and safety maintained on unit.

## 2016-02-17 NOTE — Progress Notes (Signed)

## 2016-02-17 NOTE — Plan of Care (Signed)
Problem: Alteration in mood Goal: LTG-Patient reports reduction in suicidal thoughts (Patient reports reduction in suicidal thoughts and is able to verbalize a safety plan for whenever patient is feeling suicidal)  Patient denies SI.

## 2016-02-17 NOTE — Plan of Care (Signed)
Problem: Ineffective individual coping Goal: STG: Patient will remain free from self harm Outcome: Progressing Pt safe on the unit at this time     

## 2016-02-17 NOTE — Progress Notes (Signed)
D: Pt denies SI/HI/AVH. Pt is pleasant and cooperative, affect is flat and sad, denies pain and discomfort . Pt appears  less anxious and she is interacting with peers and staff appropriately.  A: Pt was offered support and encouragement. Pt was given scheduled medications. Pt was encouraged to attend groups. Q 15 minute checks were done for safety.  R:Pt attends groups and interacts well with peers and staff. Pt is taking medication. Pt has no complaints.Pt receptive to treatment and safety maintained on unit.

## 2016-02-17 NOTE — Progress Notes (Signed)
D: Pt denies SI/HI/AVH. Pt is pleasant and cooperative. Pt stated she was feeling better, being on medications  A: Pt was offered support and encouragement. Pt was given scheduled medications. Pt was encourage to attend groups. Q 15 minute checks were done for safety.   R:Pt attends groups and interacts well with peers and staff. Pt is taking medication. Pt has no complaints.Pt receptive to treatment and safety maintained on unit.
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
## 2016-02-17 NOTE — Plan of Care (Signed)
Problem: Ineffective individual coping Goal: STG: Patient will remain free from self harm Outcome: Progressing Patient remains free from self harm on the unit currently

## 2016-02-17 NOTE — Plan of Care (Signed)
Problem: Ineffective individual coping Goal: LTG: Patient will report a decrease in negative feelings Outcome: Progressing Patient reports feeling better today than she did yesterday     

## 2016-02-18 DIAGNOSIS — F332 Major depressive disorder, recurrent severe without psychotic features: Principal | ICD-10-CM

## 2016-02-18 NOTE — Progress Notes (Signed)
D:  Per pt self inventory pt reports sleeping good, appetite good, energy level normal, ability to pay attention good, rates depression at a 0 out of 10, hopelessness at a 0 out of 10, anxiety at a 0 out of 10, denies SI/HI/AVH, goal today: "to talk to people, get out of my room and talk.", pleasant during interaction, brightens on approach.     A:  Emotional support provided, Encouraged pt to continue with treatment plan and attend all group activities, q15 min checks maintained for safety.  R:  Pt is receptive, going to groups, pleasant and cooperative with staff and other patients on the unit.

## 2016-02-18 NOTE — Progress Notes (Signed)
Patient ID: Mia Johnson, female   DOB: 05/18/1996, 20 y.o.   MRN: [id number] [facility] Note  02/18/2016 1:03 PM Mia SnideSamantha Johnson  MRN:  [id number] Subjective:  Patient seen this morning. Reports feeling better today. States she is not as angry. She is been observed to be acting silly on the unit with the other peers. States that she has a job but it's not a full-time job. States since she is on the 19 she cannot get many jobs. States that when she turns 20 she'll be able to get a better job and have a better life.  She denies any suicidal thoughts. Reports fair sleep and appetite. We'll continue to monitor  Principal Problem: Severe recurrent major depression without psychotic features Mia Johnson [facility](HCC) Diagnosis:   Patient Active Problem List   Diagnosis Date Noted  . Tobacco use disorder [F17.200] 02/16/2016  . Cannabis use disorder, mild, abuse [F12.10] 02/16/2016  . Suicide attempt (HCC) [T14.91]   . Severe recurrent major depression without psychotic features (HCC) [F33.2] 09/24/2015  . Recurrent abdominal pain [R10.9] 12/22/2014  . Right ovarian cyst [N83.201] 11/10/2014  . Alcohol use disorder, moderate, dependence (HCC) [F10.20] 04/07/2013  . PTSD (post-traumatic stress disorder) [F43.10] 09/17/2012  . Nonallopathic lesion of lumbosacral region [M99.03] 11/15/2011  . Genu valgus, congenital [Q74.1] 08/28/2011  . Cyclic vomiting syndrome [G43.A0] 12/26/2010  . Migraine [G43.909] 10/09/2010  . PES PLANUS [M21.40] 04/25/2010  . IRREGULAR MENSES [N92.6] 03/06/2010  . UNEQUAL LEG LENGTH [M21.70] 07/13/2009  . Allergic rhinitis [J30.9] 01/01/2007   Total Time spent with patient: 20 minutes  Past Psychiatric History: history of PTSD  Past Medical History:  Past Medical History  Diagnosis Date  . Asthma, moderate persistent   . Pes planus (flat feet)   . Environmental allergies   . Marijuana smoker (HCC)   . Abdominal pain   . Depression   . PTSD (post-traumatic stress  disorder)   . Ovarian cyst     Past Surgical History  Procedure Laterality Date  . Nose surgery     Family History:  Family History  Problem Relation Age of Onset  . Obesity Mother    Family Psychiatric  History: unknown Social History:  History  Alcohol Use  . Yes    Comment: ocassionally     History  Drug Use No    Social History   Social History  . Marital Status: Single    Spouse Name: N/A  . Number of Children: N/A  . Years of Education: N/A   Social History Main Topics  . Smoking status: Light Tobacco Smoker -- 0.10 packs/day    Types: Cigarettes    Last Attempt to Quit: 03/07/2014  . Smokeless tobacco: Never Used     Comment: smokes marajuana at times  . Alcohol Use: Yes     Comment: ocassionally  . Drug Use: No  . Sexual Activity: Yes    Birth Control/ Protection: None   Other Topics Concern  . None   Social History Narrative   Patient lives at home with her mother who is very involved in patient's health care at school and social life. Patient does have a history of depression has been treated with Zoloft somewhat which is improved some but still having multiple medical complaints without any physical findings.   Patient identifies herself as a lesbian and does have an partner who lives with her as well as her mother.   Additional Social History:  Sleep: Fair  Appetite:  Fair  Current Medications: Current Facility-Administered Medications  Medication Dose Route Frequency Provider Last Rate Last Dose  . acetaminophen (TYLENOL) tablet 650 mg  650 mg Oral Q6H PRN Audery Amel, MD      . albuterol (PROVENTIL HFA;VENTOLIN HFA) 108 (90 Base) MCG/ACT inhaler 2 puff  2 puff Inhalation Q4H PRN Audery Amel, MD      . alum & mag hydroxide-simeth (MAALOX/MYLANTA) 200-200-20 MG/5ML suspension 30 mL  30 mL Oral Q4H PRN Audery Amel, MD      . FLUoxetine (PROZAC) capsule 10 mg  10 mg Oral Daily Audery Amel, MD   10 mg  at 02/18/16 0904  . magnesium hydroxide (MILK OF MAGNESIA) suspension 30 mL  30 mL Oral Daily PRN Audery Amel, MD      . mirtazapine (REMERON) tablet 15 mg  15 mg Oral QHS Audery Amel, MD   15 mg at 02/17/16 2245  . pantoprazole (PROTONIX) EC tablet 40 mg  40 mg Oral Daily Audery Amel, MD   40 mg at 02/18/16 4098    Lab Results: No results found for this or any previous visit (from the past 48 hour(s)).  Blood Alcohol level:  Lab Results  Component Value Date   [facility] 174* 02/11/2016   ETH 112* 09/24/2015    Physical Findings: AIMS: Facial and Oral Movements Muscles of Facial Expression: None, normal Lips and Perioral Area: None, normal Jaw: None, normal Tongue: None, normal,Extremity Movements Upper (arms, wrists, hands, fingers): None, normal Lower (legs, knees, ankles, toes): None, normal, Trunk Movements Neck, shoulders, hips: None, normal, Overall Severity Severity of abnormal movements (highest score from questions above): None, normal Incapacitation due to abnormal movements: None, normal Patient's awareness of abnormal movements (rate only patient's report): No Awareness, Dental Status Current problems with teeth and/or dentures?: No Does patient usually wear dentures?: No  CIWA:    COWS:     Musculoskeletal: Strength & Muscle Tone: within normal limits Gait & Station: normal Patient leans: N/A  Psychiatric Specialty Exam: Review of Systems  Psychiatric/Behavioral: Positive for depression and suicidal ideas.    Blood pressure 115/60, pulse 77, temperature 98.2 F (36.8 C), temperature source Oral, resp. rate 18, height  (1.753 m), weight 129 lb (58.514 kg), SpO2 100 %.Body mass index is 19.04 kg/(m^2).  General Appearance: Casual  Eye Contact::  Fair  Speech:  coherent   Volume:  Normal   Mood:  Good   Affect:  smiling  Thought Process:  Circumstantial  Orientation:  Full (Time, Place, and Person)  Thought Content:  Rumination  Suicidal Thoughts:   No  Homicidal Thoughts:  No  Memory:  Immediate;   Fair Recent;   Fair Remote;   Fair  Judgement:  Impaired  Insight:  Shallow  Psychomotor Activity:  Normal  Concentration:  Fair  Recall:  [employer] of Knowledge:Fair  Language: Fair  Akathisia:  No  Handed:  Right  AIMS (if indicated):     Assets:  Communication Skills  ADL's:  Intact  Cognition: WNL  Sleep:  Number of Hours: 6   Treatment Plan Summary: Daily contact with patient to assess and evaluate symptoms and progress in treatment and Medication management   Depression Continue mirtazapine at 15 mg at bedtime Continue Prozac at 10 mg daily Patient to engage in groups and learn coping skills to deal with her mood swings Monitor mood and safety Discharge when stable, patient has been  doing well and can probably be discharged Monday.  Patrick North, MD 02/18/2016, 1:03 PM

## 2016-02-18 NOTE — [facility] Group Notes (Signed)
[facility] LCSW Group Therapy  02/18/2016 10:59 AM  Type of Therapy:  Group Therapy  Participation Level:  Minimal  Participation Quality:  Inattentive  Affect:  Irritable  Cognitive:  Alert  Insight:  Limited  Engagement in Therapy:  Limited  Modes of Intervention:  Discussion, Education, Socialization and Support  Summary of Progress/Problems: Mindfulness: Patient discussed mindfulness and relaxing techniques and why they are beneficial. Pt discussed ways to incorporate mindfulness in their lives. Pt practiced a mindfulness techique and discussed how it made them feel. Pt attended group and stayed the entire time. She made sarcastic remarks through out group. She was irritable and disrespectful. CSW warned her throughout group about being disrespectful.   Mia Johnson L Toshiro Hanken MSW, LCSWA  02/18/2016, 10:59 AM   

## 2016-02-19 MED ORDER — MIRTAZAPINE 15 MG PO TABS: 15.0000 mg | ORAL_TABLET | Freq: Every day | ORAL | Status: DC

## 2016-02-19 MED ORDER — FLUOXETINE HCL 10 MG PO CAPS: 10.0000 mg | ORAL_CAPSULE | Freq: Every day | ORAL | Status: DC

## 2016-02-19 NOTE — Progress Notes (Signed)
  [facility] :  Will you be returning to the same living situation after discharge:  Yes,  home with mother At discharge, do you have transportation home?: Yes,  patient's sister wil pick up Do you have the ability to pay for your medications: Yes,  patient has Medicaid  Release of information consent forms completed and in the chart;  Patient's signature needed at discharge.  Patient to Follow up at: Follow-up Information    Follow up with [facility] on 02/21/2016.   Why:  For follow-up care appt Wednesday 02/21/16 at 8:00am. (walk in appts M-F 8-4)   Contact information:   [address] [place], [place]  Ph [phone] Fax [phone]      Next level of care provider has access to [facility] Link:no  Safety Planning and Suicide Prevention discussed: Yes,  SPE discussed with patient and Neal DyMelinda Kemnitz (mother) [phone]   Have you used any form of tobacco in the last 30 days? (Cigarettes, Smokeless Tobacco, Cigars, and/or Pipes): Yes  Has patient been referred to the Quitline?: Patient refused referral  Patient has been referred for addiction treatment: Yes  Lulu RidingIngle, Earlee Herald T, MSW, LCSW 02/19/2016, 1:25 PM [phone]

## 2016-02-19 NOTE — Clinical Social Work Note (Signed)
Clinical Social Work Assessment  Patient Details  Name: Samara SnideSamantha Stinson MRN: [id number] Date of Birth: 09/15/1996  Patient's mother Neal DyMelinda Shockley [phone] returned call to CSW and states she was able to get patient a follow up appt at [facility] [place] [phone].  Beryl Meagerngle, Omunique Pederson T, LCSW 02/19/2016, 1:23 PM

## 2016-02-19 NOTE — [facility] Suicide Risk Assessment (Signed)
[facility] INPATIENT:  Family/Significant Other Suicide Prevention Education  Suicide Prevention Education:  Education Completed; Mia Johnson (mother) [phone] has been identified by the patient as the family member/significant other with whom the patient will be residing, and identified as the person(s) who will aid the patient in the event of a mental health crisis (suicidal ideations/suicide attempt).  With written consent from the patient, the family member/significant other has been provided the following suicide prevention education, prior to the and/or following the discharge of the patient.  The suicide prevention education provided includes the following:  Suicide risk factors  Suicide prevention and interventions  National Suicide Hotline telephone number  [facility] assessment telephone number  [facility] Emergency Assistance 911  [facility] and/or Residential Mobile Crisis Unit telephone number  Request made of family/significant other to:  Remove weapons (e.g., guns, rifles, knives), all items previously/currently identified as safety concern.    Remove drugs/medications (over-the-counter, prescriptions, illicit drugs), all items previously/currently identified as a safety concern.  The family member/significant other verbalizes understanding of the suicide prevention education information provided.  The family member/significant other agrees to remove the items of safety concern listed above.  Mia Johnson, Mia Johnson, MSW, LCSW 02/19/2016, 12:23 PM

## 2016-02-19 NOTE — Tx Team (Signed)
Interdisciplinary Treatment Plan Update (Adult)  Date:  02/19/2016 Time Reviewed:  10:56 AM  Progress in Treatment: Attending groups: Yes. Participating in groups:  Yes. Taking medication as prescribed:  Yes. Tolerating medication:  Yes. Family/Significant othe contact made:  Yes, individual(s) contacted:  patient's mother Patient understands diagnosis:  Yes. Discussing patient identified problems/goals with staff:  Yes. Medical problems stabilized or resolved:  Yes. Denies suicidal/homicidal ideation: Yes. Issues/concerns per patient self-inventory:  Yes. Other:  New problem(s) identified: No, Describe:  none reported  Discharge Plan or Barriers: Patient will stabilize on medications and discharge home with family and follow up with outpatient mental health services in [facility]  Reason for Continuation of Hospitalization: Depression Medication stabilization Suicidal ideation Withdrawal symptoms  Comments:  Estimated length of stay: 0 days, will discharge today Monday 02/19/16  New goal(s):  Review of initial/current patient goals per problem list:   1.  Goal(s): Participate in aftercare plan    Met:  Yes  Target date:  As evidenced by: patient will participate in aftercare plan AEB aftercare provider and housing plan identified at discharge 02/19/16: Patient will discharge home with family once stabilized and has an outpatient mental health provider in [place].   2.  Goal (s): Decrease depression    Met:  Yes  Target date: by discharge  As evidenced by: patient demonstrates decreased symptoms of depression and reports a Depression rating of 3 or less 02/19/16: Patient is stable for discharge per MD.   3.  Goal(s): Patient will demonstrate decreased signs of withdrawal due to substance abuse    Met:  Yes  Target date: by discharge  As evidenced by: Patient will produce a CIWA/COWS score of 0, have stable vitals signs, and no symptoms of  withdrawal 02/19/16: Patient is stable for discharge per MD.   Attendees: Patient:  Mia Johnson 4/17/201710:56 AM  Physician:  Orson Slick, MD 4/17/201710:56 AM  Nursing:   Terressa Koyanagi, RN 4/17/201710:56 AM  Other:  Carmell Austria, LCSW 4/17/201710:56 AM  Other:   4/17/201710:56 AM  Other:   4/17/201710:56 AM  Other:  4/17/201710:56 AM  Other:  4/17/201710:56 AM  Other:  4/17/201710:56 AM  Other:  4/17/201710:56 AM  Other:  4/17/201710:56 AM  Other:  4/17/201710:56 AM  Other:   4/17/201710:56 AM   Scribe for Treatment Team:   Keene Breath, MSW, LCSW  02/19/2016, 10:56 AM
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
## 2016-02-19 NOTE — [facility] Suicide Risk Assessment (Signed)
[facility] [place]   Principal Problem: Severe recurrent major depression without psychotic features [facility](HCC) Discharge Diagnoses:  Patient Active Problem List   Diagnosis Date Noted  . Tobacco use disorder [F17.200] 02/16/2016  . Cannabis use disorder, mild, abuse [F12.10] 02/16/2016  . Suicide attempt (HCC) [T14.91]   . Severe recurrent major depression without psychotic features (HCC) [F33.2] 09/24/2015  . Recurrent abdominal pain [R10.9] 12/22/2014  . Right ovarian cyst [N83.201] 11/10/2014  . Alcohol use disorder, moderate, dependence (HCC) [F10.20] 04/07/2013  . PTSD (post-traumatic stress disorder) [F43.10] 09/17/2012  . Nonallopathic lesion of lumbosacral region [M99.03] 11/15/2011  . Genu valgus, congenital [Q74.1] 08/28/2011  . Cyclic vomiting syndrome [G43.A0] 12/26/2010  . Migraine [G43.909] 10/09/2010  . PES PLANUS [M21.40] 04/25/2010  . IRREGULAR MENSES [N92.6] 03/06/2010  . UNEQUAL LEG LENGTH [M21.70] 07/13/2009  . Allergic rhinitis [J30.9] 01/01/2007    Total Time spent with patient: 30 minutes  Musculoskeletal: Strength & Muscle Tone: within normal limits Gait & Station: normal Patient leans: N/A  Psychiatric Specialty Exam: Review of Systems  All other systems reviewed and are negative.   Blood pressure 117/69, pulse 69, temperature 98.1 F (36.7 C), temperature source Oral, resp. rate 18, height 5\' 9"  (1.753 m), weight 58.514 kg (129 lb), SpO2 100 %.Body mass index is 19.04 kg/(m^2).  General Appearance: Casual  Eye Contact::  Fair  Speech:  Clear and Coherent409  Volume:  Normal  Mood:  Euthymic  Affect:  Appropriate  Thought Process:  Goal Directed  Orientation:  Full (Time, Place, and Person)  Thought Content:  WDL  Suicidal Thoughts:  No  Homicidal Thoughts:  No  Memory:  Immediate;   Fair Recent;   Fair Remote;   Fair  Judgement:  Impaired  Insight:  Present  Psychomotor Activity:  Normal  Concentration:  Fair  Recall:   FiservFair  Fund of Knowledge:Fair  Language: Fair  Akathisia:  No  Handed:  Right  AIMS (if indicated):     Assets:  Communication Skills Desire for Improvement Financial Resources/Insurance Housing Physical Health Resilience Social Support  Sleep:  Number of Hours: 6.45  Cognition: WNL  ADL's:  Intact   Mental Status Per Nursing Assessment::   On Admission:     Demographic Factors:  Adolescent or young adult and Unemployed  Loss Factors: NA  Historical Factors: Prior suicide attempts and Impulsivity  Risk Reduction Factors:   Sense of responsibility to family, Living with another person, especially a relative and Positive social support  Continued Clinical Symptoms:  Depression:   Comorbid alcohol abuse/dependence Impulsivity Alcohol/Substance Abuse/Dependencies  Cognitive Features That Contribute To Risk:  None    Suicide Risk:  Minimal: No identifiable suicidal ideation.  Patients presenting with no risk factors but with morbid ruminations; may be classified as minimal risk based on the severity of the depressive symptoms    Plan Of Care/Follow-up recommendations:  Activity:  As tolerated. Diet:  Regular. Other:  Keep follow-up appointments.  Kristine LineaJolanta Clovia Reine, MD 02/19/2016, 9:01 AM

## 2016-02-19 NOTE — Progress Notes (Signed)
Pt spent most evening in the day room, pt is pleasant, calm and cooperative. Denies any SI/HI/VAH; no concerns voiced, will continue to monitor closely for safety.

## 2016-02-19 NOTE — Plan of Care (Signed)
Problem: Ineffective individual coping Goal: STG: Pt will be able to identify effective and ineffective STG: Pt will be able to identify effective and ineffective coping patterns  Outcome: Progressing Pt able to identify effective coping skills such as relaxation techniques.

## 2016-04-29 ENCOUNTER — Emergency Department (HOSPITAL_COMMUNITY)
Admission: EM | Admit: 2016-04-29 | Discharge: 2016-04-29 | Discharge status: Against Medical Advice | Payer: Medicaid Other

## 2016-04-29 ENCOUNTER — Ambulatory Visit (INDEPENDENT_AMBULATORY_CARE_PROVIDER_SITE_OTHER): Payer: Medicaid Other | Admitting: Family Medicine

## 2016-04-29 VITALS — BP 125/62 | HR 65 | Temp 98.7°F | Wt 135.2 lb

## 2016-04-29 DIAGNOSIS — M542 Cervicalgia: Secondary | ICD-10-CM | POA: Diagnosis not present

## 2016-04-29 DIAGNOSIS — R51 Headache: Secondary | ICD-10-CM

## 2016-04-29 DIAGNOSIS — M79671 Pain in right foot: Secondary | ICD-10-CM | POA: Diagnosis not present

## 2016-04-29 DIAGNOSIS — R519 Headache, unspecified: Secondary | ICD-10-CM

## 2016-04-29 MED ORDER — CYCLOBENZAPRINE HCL 5 MG PO TABS
5.0000 mg | ORAL_TABLET | Freq: Every day | ORAL | Status: DC
Start: 2016-04-29 — End: 2016-10-14

## 2016-04-29 MED ORDER — IBUPROFEN 600 MG PO TABS: 600.0000 mg | ORAL_TABLET | Freq: Three times a day (TID) | ORAL | Status: DC | PRN

## 2016-04-29 NOTE — ED Notes (Signed)
Mother is going to take pt to her family dr
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
## 2016-04-30 ENCOUNTER — Encounter: Payer: Self-pay | Admitting: Family Medicine
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
## 2016-04-30 NOTE — Patient Instructions (Signed)
Thank you for coming in to clinic today.  1. Recommend trial ibuprofen and flexeril PRN 2. Ice packs and heat as needed 3. Recommended several x-rays today, however you have declined to get these done, if you change your mind call our clinic  It is difficult to determine if you had a concussion, however recommend physical and cognitive rest, let us know if symptoms worsening, especially if worsening headache, loss of vision, confusion, nausea, vomiting.  Please schedule a follow-up appointment with Dr Waynetta SandyWight within 1 week to follow-up Headache / R foot pain  If you have any other questions or concerns, please feel free to call the clinic to contact me. You may also schedule an earlier appointment if necessary.  However, if your symptoms get significantly worse, please go to the Emergency Department to seek immediate medical attention.  Saralyn PilarAlexander Karamalegos, DO [facility]

## 2016-05-27 ENCOUNTER — Ambulatory Visit (HOSPITAL_COMMUNITY)
Admission: EM | Admit: 2016-05-27 | Discharge: 2016-05-27 | Disposition: A | Discharge status: Home / Self Care | Payer: Medicaid Other | Attending: Internal Medicine | Admitting: Internal Medicine

## 2016-05-27 ENCOUNTER — Ambulatory Visit (INDEPENDENT_AMBULATORY_CARE_PROVIDER_SITE_OTHER): Payer: Medicaid Other

## 2016-05-27 ENCOUNTER — Encounter (HOSPITAL_COMMUNITY): Payer: Self-pay | Admitting: Emergency Medicine

## 2016-05-27 DIAGNOSIS — S6000XA Contusion of unspecified finger without damage to nail, initial encounter: Secondary | ICD-10-CM

## 2016-05-27 DIAGNOSIS — S63502A Unspecified sprain of left wrist, initial encounter: Secondary | ICD-10-CM | POA: Diagnosis not present

## 2016-05-27 NOTE — ED Triage Notes (Signed)
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
## 2016-09-17 ENCOUNTER — Emergency Department (HOSPITAL_COMMUNITY): Payer: Self-pay

## 2016-09-17 ENCOUNTER — Emergency Department (HOSPITAL_COMMUNITY)
Admission: EM | Admit: 2016-09-17 | Discharge: 2016-09-17 | Disposition: A | Discharge status: Home / Self Care | Payer: Self-pay | Attending: Emergency Medicine | Admitting: Emergency Medicine

## 2016-09-17 ENCOUNTER — Encounter (HOSPITAL_COMMUNITY): Payer: Self-pay

## 2016-09-17 DIAGNOSIS — W500XXA Accidental hit or strike by another person, initial encounter: Secondary | ICD-10-CM | POA: Insufficient documentation

## 2016-09-17 DIAGNOSIS — F17210 Nicotine dependence, cigarettes, uncomplicated: Secondary | ICD-10-CM | POA: Insufficient documentation

## 2016-09-17 DIAGNOSIS — S93401A Sprain of unspecified ligament of right ankle, initial encounter: Secondary | ICD-10-CM | POA: Insufficient documentation

## 2016-09-17 DIAGNOSIS — Y999 Unspecified external cause status: Secondary | ICD-10-CM | POA: Insufficient documentation

## 2016-09-17 DIAGNOSIS — Y929 Unspecified place or not applicable: Secondary | ICD-10-CM | POA: Insufficient documentation

## 2016-09-17 DIAGNOSIS — J45909 Unspecified asthma, uncomplicated: Secondary | ICD-10-CM | POA: Insufficient documentation

## 2016-09-17 DIAGNOSIS — Y9367 Activity, basketball: Secondary | ICD-10-CM | POA: Insufficient documentation

## 2016-09-17 MED ORDER — IBUPROFEN 600 MG PO TABS: 600.0000 mg | ORAL_TABLET | Freq: Four times a day (QID) | ORAL | 0 refills | Status: DC | PRN

## 2016-09-17 NOTE — Progress Notes (Signed)
Orthopedic Tech Progress Note Patient Details:  Samara SnideSamantha Anacker 05/16/1996 [id number]  Ortho Devices Type of Ortho Device: Crutches, ASO Ortho Device/Splint Interventions: Application, Adjustment Applied ASO ankle on right Ankle (LLE). Provided, Adjusted and trained Crutches.  Alvina ChouWilliams, Kaelene Elliston C 09/17/2016, 8:18 PM

## 2016-09-17 NOTE — Discharge Instructions (Signed)
Take your medication as prescribed as needed for pain and swelling. I recommend wearing your ankle brace daily for the next 1-2 weeks until your pain and swelling have improved. You may continue to use crutches as needed for comfort over the next 2-3 days. I recommend following up with the orthopedic clinic listed below in the next 1-2 weeks if your symptoms have not improved or have worsened. Please return to the Emergency Department if symptoms worsen or new onset of fever, redness, swelling, numbness, tingling, weakness, decreased range of motion.

## 2016-09-17 NOTE — ED Triage Notes (Signed)
Onset 30 min PTA pt playing basketball, was jumping up, someone hit her medial knee and another playing hit her lateral knee, felt pain in right foot.  Unable to bear full weight on foot.
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
## 2016-09-17 NOTE — ED Notes (Signed)
Patient transported to X-ray 

## 2016-09-17 NOTE — ED Notes (Signed)
Patient returned from xray.

## 2016-09-30 ENCOUNTER — Emergency Department (HOSPITAL_COMMUNITY)
Admission: EM | Admit: 2016-09-30 | Discharge: 2016-09-30 | Disposition: A | Discharge status: Home / Self Care | Payer: Medicaid Other | Attending: Emergency Medicine | Admitting: Emergency Medicine

## 2016-09-30 ENCOUNTER — Encounter (HOSPITAL_COMMUNITY): Payer: Self-pay | Admitting: Emergency Medicine

## 2016-09-30 DIAGNOSIS — F17210 Nicotine dependence, cigarettes, uncomplicated: Secondary | ICD-10-CM | POA: Insufficient documentation

## 2016-09-30 DIAGNOSIS — S93401D Sprain of unspecified ligament of right ankle, subsequent encounter: Secondary | ICD-10-CM

## 2016-09-30 DIAGNOSIS — Z79899 Other long term (current) drug therapy: Secondary | ICD-10-CM | POA: Insufficient documentation

## 2016-09-30 DIAGNOSIS — X58XXXD Exposure to other specified factors, subsequent encounter: Secondary | ICD-10-CM | POA: Insufficient documentation

## 2016-09-30 DIAGNOSIS — J45909 Unspecified asthma, uncomplicated: Secondary | ICD-10-CM | POA: Insufficient documentation

## 2016-09-30 NOTE — Discharge Instructions (Signed)
Take the ibuprofen. Follow up with ortho if symptoms persist.
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
## 2016-09-30 NOTE — ED Triage Notes (Signed)
C/o R ankle pain and R knee pain x 2 weeks.  States she was seen in ED 2 weeks ago for same and told to return if symptoms didn't improve.

## 2016-10-02 ENCOUNTER — Encounter (HOSPITAL_COMMUNITY): Payer: Self-pay | Admitting: Emergency Medicine

## 2016-10-02 ENCOUNTER — Emergency Department (HOSPITAL_COMMUNITY)
Admission: EM | Admit: 2016-10-02 | Discharge: 2016-10-03 | Disposition: A | Discharge status: Home / Self Care | Payer: Medicaid Other | Attending: Emergency Medicine | Admitting: Emergency Medicine

## 2016-10-02 DIAGNOSIS — K529 Noninfective gastroenteritis and colitis, unspecified: Secondary | ICD-10-CM

## 2016-10-02 DIAGNOSIS — R112 Nausea with vomiting, unspecified: Secondary | ICD-10-CM

## 2016-10-02 DIAGNOSIS — R197 Diarrhea, unspecified: Secondary | ICD-10-CM

## 2016-10-02 DIAGNOSIS — F17210 Nicotine dependence, cigarettes, uncomplicated: Secondary | ICD-10-CM | POA: Insufficient documentation

## 2016-10-02 LAB — URINALYSIS, ROUTINE W REFLEX MICROSCOPIC
Glucose, UA: NEGATIVE mg/dL
Hgb urine dipstick: NEGATIVE
KETONES UR: 15 mg/dL — AB
LEUKOCYTES UA: NEGATIVE
NITRITE: NEGATIVE
Protein, ur: NEGATIVE mg/dL
Specific Gravity, Urine: 1.025 (ref 1.005–1.030)
pH: 5 (ref 5.0–8.0)

## 2016-10-02 LAB — CBC
HCT: 40.8 % (ref 36.0–46.0)
HEMOGLOBIN: 13.8 g/dL (ref 12.0–15.0)
MCH: 27.9 pg (ref 26.0–34.0)
MCHC: 33.8 g/dL (ref 30.0–36.0)
MCV: 82.6 fL (ref 78.0–100.0)
Platelets: 243 10*3/uL (ref 150–400)
RBC: 4.94 MIL/uL (ref 3.87–5.11)
RDW: 13 % (ref 11.5–15.5)
WBC: 7.3 10*3/uL (ref 4.0–10.5)

## 2016-10-02 LAB — I-STAT BETA HCG BLOOD, ED (MC, WL, AP ONLY)

## 2016-10-02 LAB — COMPREHENSIVE METABOLIC PANEL
ALBUMIN: 4.6 g/dL (ref 3.5–5.0)
ALK PHOS: 73 U/L (ref 38–126)
ALT: 13 U/L — ABNORMAL LOW (ref 14–54)
ANION GAP: 7 (ref 5–15)
AST: 17 U/L (ref 15–41)
BILIRUBIN TOTAL: 1.2 mg/dL (ref 0.3–1.2)
BUN: 14 mg/dL (ref 6–20)
CALCIUM: 9 mg/dL (ref 8.9–10.3)
CO2: 24 mmol/L (ref 22–32)
Chloride: 108 mmol/L (ref 101–111)
Creatinine, Ser: 0.82 mg/dL (ref 0.44–1.00)
GFR calc Af Amer: >60 mL/min (ref >60)
GLUCOSE: 99 mg/dL (ref 65–99)
POTASSIUM: 3.9 mmol/L (ref 3.5–5.1)
Sodium: 139 mmol/L (ref 135–145)
TOTAL PROTEIN: 7.5 g/dL (ref 6.5–8.1)

## 2016-10-02 LAB — LIPASE, BLOOD: Lipase: 17 U/L (ref 11–51)

## 2016-10-02 MED ORDER — PROMETHAZINE HCL 25 MG/ML IJ SOLN
12.5000 mg | Freq: Once | INTRAMUSCULAR | Status: AC
  Administered 2016-10-02: 12.5 mg via INTRAVENOUS
  Filled 2016-10-02: qty 1

## 2016-10-02 MED ORDER — ONDANSETRON 4 MG PO TBDP
4.0000 mg | ORAL_TABLET | Freq: Once | ORAL | Status: AC | PRN
  Administered 2016-10-02: 4 mg via ORAL
  Filled 2016-10-02: qty 1

## 2016-10-02 MED ORDER — SODIUM CHLORIDE 0.9 % IV BOLUS (SEPSIS)
1000.0000 mL | Freq: Once | INTRAVENOUS | Status: AC
  Administered 2016-10-02: 1000 mL via INTRAVENOUS

## 2016-10-02 NOTE — ED Triage Notes (Signed)
Pt reports possible food poisoning, ate raw chicken nuggets from [employer]. Sts emesis since 5 AM. Diarrhea all day. abd pain and dizziness. Alert and oriented  X4
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
## 2016-10-03 MED ORDER — ONDANSETRON 8 MG PO TBDP: ORAL_TABLET | ORAL | 0 refills | Status: DC

## 2016-10-03 NOTE — Discharge Instructions (Signed)
Zofran as prescribed as needed for nausea.  Clear liquid diet for the next 12 hours, then slowly advance to normal.  Return to the emergency department for worsening abdominal pain, high fevers, bloody stool, or other new and concerning symptoms.

## 2016-10-10 ENCOUNTER — Ambulatory Visit: Payer: Self-pay | Admitting: Internal Medicine

## 2016-10-14 ENCOUNTER — Encounter: Payer: Self-pay | Admitting: Internal Medicine

## 2016-10-14 ENCOUNTER — Ambulatory Visit: Payer: Self-pay | Admitting: Internal Medicine

## 2016-10-14 ENCOUNTER — Ambulatory Visit (INDEPENDENT_AMBULATORY_CARE_PROVIDER_SITE_OTHER): Payer: Self-pay | Admitting: Internal Medicine

## 2016-10-14 DIAGNOSIS — S93409A Sprain of unspecified ligament of unspecified ankle, initial encounter: Secondary | ICD-10-CM | POA: Insufficient documentation

## 2016-10-14 DIAGNOSIS — S93401D Sprain of unspecified ligament of right ankle, subsequent encounter: Secondary | ICD-10-CM

## 2016-10-14 NOTE — Patient Instructions (Signed)
It was so nice to see you!  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 I have provided a handout with exercises.  You may continued to have soreness and twinges of pain for the next 6-8 months. Continuing to do the exercises will help!  -Dr. Nancy MarusMayo

## 2016-10-14 NOTE — Progress Notes (Signed)
   [facility] Phone: [phone]  Subjective:  [name] is a 20 year old female presenting to clinic for follow-up of ankle pain. 6 weeks ago, she was playing basketball when her opponent's leg hit her leg and caused her right ankle to "snap" laterally. She was able to walk after the impact. She noticed some swelling of her lateral right ankle. She went to the ED and had an x-ray which did not show any fractures. She was given an air cast. She states the air cast hurts the inside of her foot when she wears it. She went back to the ED and they told her she sprained her ankle. They gave her an ASO brace, which hurts the outside of her ankle. She states the swelling has been improving. She hasn't been playing basketball because she doesn't want to hurt her ankle. She has been trying to move her ankle in a circle to maintain range of motion.  ROS: See HPI for pertinent positives and negatives  Past Medical History- migraine, tobacco use, marijuana use  Family history reviewed for today's visit. No changes.  Social history- patient is a current smoker  Objective: BP (!) 102/58   Pulse 68   Temp 98.7 F (37.1 C) (Oral)   Wt 132 lb (59.9 kg)   LMP 08/01/2016   SpO2 99%   BMI 19.49 kg/m  Gen: NAD, alert, cooperative with exam Msk: Mild edema over the lateral malleolus, tenderness to palpation over the anterior talofibular ligament and calcaneofibular ligament. Normal ROM. Pt able to bear weight and has a normal gait.  Assessment/Plan: Right Ankle Sprain: Pt sprained her ankle 6 weeks ago. Had a negative x-rays. Has been using ASO brace and air cast, which seem to be hurting more than helping. Pt able to bear weight and can walk normally. Don't think she needs a repeat x-ray. - Pt given ankle exercise handouts for rehabilitation. - Advised that she does not need to use a brace - She may continue to feel discomfort and soreness over the next 6 months - Follow-up if  pain is worsening.   Willadean CarolKaty Reality Dejonge, MD PGY-2

## 2016-10-14 NOTE — Assessment & Plan Note (Signed)
Pt sprained her ankle 6 weeks ago. Had a negative x-rays. Has been using ASO brace and air cast, which seem to be hurting more than helping. Pt able to bear weight and can walk normally. Don't think she needs a repeat x-ray. - Pt given ankle exercise handouts for rehabilitation. - Advised that she does not need to use a brace - She may continue to feel discomfort and soreness over the next 6 months - Follow-up if pain is worsening.

## 2016-11-06 ENCOUNTER — Encounter (HOSPITAL_COMMUNITY): Payer: Self-pay | Admitting: Emergency Medicine

## 2016-11-06 ENCOUNTER — Emergency Department (HOSPITAL_COMMUNITY)
Admission: EM | Admit: 2016-11-06 | Discharge: 2016-11-06 | Disposition: A | Discharge status: Home / Self Care | Payer: Self-pay | Attending: Emergency Medicine | Admitting: Emergency Medicine

## 2016-11-06 DIAGNOSIS — Z7729 Contact with and (suspected ) exposure to other hazardous substances: Secondary | ICD-10-CM

## 2016-11-06 DIAGNOSIS — T5891XA Toxic effect of carbon monoxide from unspecified source, accidental (unintentional), initial encounter: Secondary | ICD-10-CM | POA: Insufficient documentation

## 2016-11-06 DIAGNOSIS — J45909 Unspecified asthma, uncomplicated: Secondary | ICD-10-CM | POA: Insufficient documentation

## 2016-11-06 DIAGNOSIS — F17210 Nicotine dependence, cigarettes, uncomplicated: Secondary | ICD-10-CM | POA: Insufficient documentation

## 2016-11-06 MED ORDER — ONDANSETRON 4 MG PO TBDP
8.0000 mg | ORAL_TABLET | Freq: Once | ORAL | Status: AC
  Administered 2016-11-06: 8 mg via ORAL
  Filled 2016-11-06: qty 2

## 2016-11-06 MED ORDER — IBUPROFEN 800 MG PO TABS
800.0000 mg | ORAL_TABLET | Freq: Once | ORAL | Status: AC
  Administered 2016-11-06: 800 mg via ORAL
  Filled 2016-11-06: qty 1

## 2016-11-06 MED ORDER — ACETAMINOPHEN 325 MG PO TABS
650.0000 mg | ORAL_TABLET | Freq: Once | ORAL | Status: AC
  Administered 2016-11-06: 650 mg via ORAL
  Filled 2016-11-06: qty 2
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
## 2016-11-06 NOTE — ED Triage Notes (Signed)
Pt here for possible exposure to natural gas; pt sts HA and dizziness x 1 week

## 2016-11-06 NOTE — ED Notes (Signed)
See EDP assessment 

## 2016-12-06 ENCOUNTER — Encounter: Payer: Self-pay | Admitting: Family Medicine

## 2016-12-06 ENCOUNTER — Ambulatory Visit (INDEPENDENT_AMBULATORY_CARE_PROVIDER_SITE_OTHER): Payer: Self-pay | Admitting: Family Medicine

## 2016-12-06 VITALS — BP 110/50 | HR 68 | Temp 98.2°F | Ht 69.0 in | Wt 141.2 lb

## 2016-12-06 DIAGNOSIS — J029 Acute pharyngitis, unspecified: Secondary | ICD-10-CM

## 2016-12-06 LAB — POCT RAPID STREP A (OFFICE): RAPID STREP A SCREEN: NEGATIVE

## 2016-12-06 MED ORDER — SUCRALFATE 1 GM/10ML PO SUSP: 1.0000 g | Freq: Three times a day (TID) | ORAL | 1 refills | Status: DC

## 2016-12-06 MED ORDER — OMEPRAZOLE 20 MG PO CPDR: 20.0000 mg | DELAYED_RELEASE_CAPSULE | Freq: Two times a day (BID) | ORAL | 0 refills | Status: DC

## 2016-12-06 NOTE — Assessment & Plan Note (Signed)
Suspect due to esophageal ulceration/irritation from frequent emesis. No current URI symptoms. -restart otc Omeprazole (if patient can afford) -if can not afford omeprazole told to start otc Zantac BID -prescription of Carafate sent to pharmacy -referral to GI

## 2016-12-06 NOTE — Progress Notes (Signed)
   Subjective:    Patient ID: Mia Johnson, female    DOB: 03/06/1996, 21 y.o.   MRN: [id number]  HPI 21 y/o female presents for evaluation of sore throat. History of cyclic vomiting syndrome. Has not had vomiting episode for the past month however does gag/spit up daily when brushing her teeth.   Pain 10/10, feels like swallowing glass, started 4 days ago, intermittent pain that was similar for the past six years, no associated congestion, rhinorrhea, or cough. Has not taken an otc medications for this. Has always resolved on its own. Has previously been seen by [facility]. Had EGD approximately 2 years ago (Unable to see previous GI notes).   Surgical History: nose surgery    Review of Systems See above.     Objective:   Physical Exam BP (!) 110/50   Pulse 68   Temp 98.2 F (36.8 C) (Oral)   Ht 5\' 9"  (1.753 m)   Wt 141 lb 3.2 oz (64 kg)   SpO2 99%   BMI 20.85 kg/m   Gen: pleasant female, NAD HEENT: normocephalic, PERRL, EOMI, no scleral icterus, MMM, no oral lesions, no pharyngeal erythema or exudate, neck supple, no adenopathy Cardiac: RRR, S1 and S2 present, no murmur Resp: CTAB, normal effort      Assessment & Plan:  Sore throat Suspect due to esophageal ulceration/irritation from frequent emesis. No current URI symptoms. -restart otc Omeprazole (if patient can afford) -if can not afford omeprazole told to start otc Zantac BID -prescription of Carafate sent to pharmacy -referral to GI

## 2016-12-06 NOTE — Patient Instructions (Addendum)
It was nice to meet you today.  I would like for you to take an acid reducing medication.   Omeprazole (Proton Pump Inhibitor) - this is the stronger medication, take 20 mg twice daily  Zantac (H2 blocker) - this is not as strong, take 150 mg twice daily if you are unable to afford the Omeprazole  Take Carafate with meals (I have sent in a prescription)  I have referred you to the GI doctor.  Please make an appointment with Annice PihJackie for the orange card/financial assistance.

## 2016-12-30 ENCOUNTER — Encounter (HOSPITAL_COMMUNITY): Payer: Self-pay

## 2016-12-30 ENCOUNTER — Emergency Department (HOSPITAL_COMMUNITY)
Admission: EM | Admit: 2016-12-30 | Discharge: 2016-12-31 | Disposition: A | Discharge status: Home / Self Care | Payer: Self-pay | Attending: Emergency Medicine | Admitting: Emergency Medicine

## 2016-12-30 DIAGNOSIS — F17210 Nicotine dependence, cigarettes, uncomplicated: Secondary | ICD-10-CM | POA: Insufficient documentation

## 2016-12-30 DIAGNOSIS — G43909 Migraine, unspecified, not intractable, without status migrainosus: Secondary | ICD-10-CM | POA: Insufficient documentation

## 2016-12-30 DIAGNOSIS — Z79899 Other long term (current) drug therapy: Secondary | ICD-10-CM | POA: Insufficient documentation

## 2016-12-30 DIAGNOSIS — J45909 Unspecified asthma, uncomplicated: Secondary | ICD-10-CM | POA: Insufficient documentation

## 2016-12-30 NOTE — ED Triage Notes (Signed)
Pt states she was walking around in [employer] and suddenly around 2145 pt lost vision in her left eye "its blurry but not exactly black." She states at the same time her left arm became numb, lasting about 30 minutes but has resolved. She still has tingling in her fingertips and states she blurry vision peripherally out of her left eye. Pt A&Ox4, speaking clear complete sentences, no facial droop, strong equal hand grasps bilaterally. She also reports CP that started 2 weeks ago.

## 2016-12-31 MED ORDER — DIPHENHYDRAMINE HCL 50 MG/ML IJ SOLN
12.5000 mg | Freq: Once | INTRAMUSCULAR | Status: AC
  Administered 2016-12-31: 12.5 mg via INTRAVENOUS
  Filled 2016-12-31: qty 1

## 2016-12-31 MED ORDER — DEXAMETHASONE SODIUM PHOSPHATE 10 MG/ML IJ SOLN
10.0000 mg | Freq: Once | INTRAMUSCULAR | Status: AC
  Administered 2016-12-31: 10 mg via INTRAVENOUS
  Filled 2016-12-31: qty 1

## 2016-12-31 MED ORDER — METOCLOPRAMIDE HCL 5 MG/ML IJ SOLN
10.0000 mg | Freq: Once | INTRAMUSCULAR | Status: AC
  Administered 2016-12-31: 10 mg via INTRAVENOUS
  Filled 2016-12-31: qty 2
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
## 2017-02-24 ENCOUNTER — Ambulatory Visit (INDEPENDENT_AMBULATORY_CARE_PROVIDER_SITE_OTHER): Payer: Self-pay | Admitting: Student

## 2017-02-24 ENCOUNTER — Encounter: Payer: Self-pay | Admitting: Student

## 2017-02-24 DIAGNOSIS — Z5989 Other problems related to housing and economic circumstances: Secondary | ICD-10-CM

## 2017-02-24 DIAGNOSIS — Z5971 Insufficient health insurance coverage: Secondary | ICD-10-CM

## 2017-02-24 DIAGNOSIS — R1115 Cyclical vomiting syndrome unrelated to migraine: Secondary | ICD-10-CM

## 2017-02-24 DIAGNOSIS — G43A0 Cyclical vomiting, not intractable: Secondary | ICD-10-CM

## 2017-02-24 DIAGNOSIS — Z598 Other problems related to housing and economic circumstances: Secondary | ICD-10-CM

## 2017-02-24 MED ORDER — ONDANSETRON HCL 4 MG PO TABS: 4.0000 mg | ORAL_TABLET | Freq: Three times a day (TID) | ORAL | 1 refills | Status: DC | PRN

## 2017-02-24 NOTE — Assessment & Plan Note (Signed)
Advised to apply for the orange card while she does not have insurance

## 2017-02-24 NOTE — Progress Notes (Signed)
Subjective:    Patient ID: Mia Johnson, female    DOB: 07/07/1996, 21 y.o.   MRN: [id number]   CC: cyclic vomiting syndrome  HPI: 21 y/o F presents for cyclic vomiting syndrome  Cyclic vomiting - worse in the AM - then resolve for the rest of the day - she is able to eat and drink - her symptoms get better while in a hot bath - she denies using marijuana recently but her partner smokes marijuana around her - she is sexually active with females only - Patient's last menstrual period was 02/18/2017.   Lack of insurance - she never picked up the prilosec or zofran which were recently filled as she does not have insurance   Smoking status reviewed . Review of Systems  Per HPI, else denies recent illness, fever, chest pain, shortness of breath,    Objective:  BP 112/78   Pulse 64   Temp 98.8 F (37.1 C) (Oral)   Wt 143 lb (64.9 kg)   LMP 02/18/2017   SpO2 97%   BMI 21.12 kg/m  Vitals and nursing note reviewed  General: NAD Cardiac: RRR,  Respiratory: CTAB, normal effort Abdomen: soft, nontender, nondistended, no hepatic or splenomegaly. Bowel sounds present Skin: warm and dry, no rashes noted Neuro: alert and oriented, no focal deficits   Assessment & Plan:    Cyclic vomiting syndrome Zofran refilled, and she was asked to try to obtain it. Given her symptoms are relieved with hot baths/showers there is significant suspicion that her symtpoms are due to marijuana use/exposure - counseled to abstain from/avoid exposure to marijauna - will follow as needed  Does not have health insurance Advised to apply for the orange card while she does not have insurance    Maximina Pirozzi A. Kennon Rounds MD, MS Family Medicine Resident PGY-3 Pager [phone]

## 2017-02-24 NOTE — Assessment & Plan Note (Signed)
Zofran refilled, and she was asked to try to obtain it. Given her symptoms are relieved with hot baths/showers there is significant suspicion that her symtpoms are due to marijuana use/exposure - counseled to abstain from/avoid exposure to marijauna - will follow as needed

## 2017-02-24 NOTE — Patient Instructions (Signed)
Follow up as needed Take Zofran for nausea. Please avoid Marijuana use as it can cause Vomiting Please inquire about the [facility] card

## 2017-04-02 ENCOUNTER — Ambulatory Visit (INDEPENDENT_AMBULATORY_CARE_PROVIDER_SITE_OTHER): Payer: Medicaid Other | Admitting: Internal Medicine

## 2017-04-02 ENCOUNTER — Encounter: Payer: Self-pay | Admitting: Internal Medicine

## 2017-04-02 DIAGNOSIS — J3089 Other allergic rhinitis: Secondary | ICD-10-CM | POA: Diagnosis present

## 2017-04-02 MED ORDER — FLUTICASONE PROPIONATE 50 MCG/ACT NA SUSP: 2.0000 | Freq: Every day | NASAL | 6 refills | Status: DC

## 2017-04-02 MED ORDER — DEXTROMETHORPHAN HBR 15 MG/5ML PO SYRP: 10.0000 mL | ORAL_SOLUTION | Freq: Four times a day (QID) | ORAL | 0 refills | Status: DC | PRN

## 2017-04-02 MED ORDER — CETIRIZINE HCL 10 MG PO CAPS: 10.0000 mg | ORAL_CAPSULE | Freq: Every day | ORAL | 0 refills | Status: DC

## 2017-04-02 NOTE — Progress Notes (Signed)
   [facility] Phone: [phone]  Subjective:  Mia MastSamantha is a 21 year old female presenting to clinic with cough and sinus drainage. She noticed that her symptoms got worse 5 days ago. She has been staying at her mom's house and is wondering if she is having allergies after being around her mom's cat. She states that she was initially having some blood come from her sinuses and was occasionally coughing up that blood after it would run down her throat. She has been having occasional headaches. She endorses clear rhinorrhea. She also notes that she has been losing her voice on and off. She denies any fevers or chills. No shortness of breath. She has been using an OTC allergy relief medication [facility], which has been helping.  ROS: See HPI for pertinent positives and negatives  Past Medical History- migraines, PTSD, severe depression  Family history reviewed for today's visit. No changes.  Social history- patient is a current smoker. Has a hx of alcohol dependence.  Objective: BP 102/60   Pulse 75   Temp 98.2 F (36.8 C) (Oral)   Ht 5\' 8"  (1.727 m)   Wt 140 lb (63.5 kg)   SpO2 98%   BMI 21.29 kg/m  Gen: NAD, alert, cooperative with exam HEENT: NCAT, EOMI, MMM, TMs normal, oropharynx erythematous without exudates, nasal turbinates edematous and mildly erythematous. Neck: FROM, supple, no cervical lymphadenopathy CV: RRR, no murmur Resp: CTABL, no wheezes, normal work of breathing, good air movement throughout all lung fields.  Assessment/Plan: Allergic Rhinitis: Pt with cough and sinus drainage. Worsened over the last 5 days since being around her mom's cat. No signs of bacterial infection on exam. - Will prescribe Zyrtec and Flonase - Also prescribed Robitussin, as Pt is asking for something for her cough since it is keeping her awake at night. Discussed that this will likely get better with taking the allergy medications. - Return precautions  discussed - Follow-up if no improvement   Mia CarolKaty [name], MD PGY-2

## 2017-04-02 NOTE — Assessment & Plan Note (Signed)
Pt with cough and sinus drainage. Worsened over the last 5 days since being around her mom's cat. No signs of bacterial infection on exam. - Will prescribe Zyrtec and Flonase - Also prescribed Robitussin, as Pt is asking for something for her cough since it is keeping her awake at night. Discussed that this will likely get better with taking the allergy medications. - Return precautions discussed - Follow-up if no improvement

## 2017-04-02 NOTE — Patient Instructions (Signed)
It was so nice to see you!  I think your symptoms are all caused by allergies. I have prescribed Zyrtec. You should take this daily. I have also prescribed some Flonase. You should use two sprays per nostril daily. You should turn this spray towards the outside of your nose. I have also prescribed some cough syrup to use until the allergy medication kicks in.  -Dr. Nancy MarusMayo

## 2017-04-17 ENCOUNTER — Emergency Department (HOSPITAL_COMMUNITY): Payer: Self-pay

## 2017-04-17 ENCOUNTER — Encounter (HOSPITAL_COMMUNITY): Payer: Self-pay | Admitting: *Deleted

## 2017-04-17 ENCOUNTER — Emergency Department (HOSPITAL_COMMUNITY)
Admission: EM | Admit: 2017-04-17 | Discharge: 2017-04-17 | Disposition: A | Discharge status: Home / Self Care | Payer: Self-pay | Attending: Emergency Medicine | Admitting: Emergency Medicine

## 2017-04-17 DIAGNOSIS — Y999 Unspecified external cause status: Secondary | ICD-10-CM | POA: Insufficient documentation

## 2017-04-17 DIAGNOSIS — Y9367 Activity, basketball: Secondary | ICD-10-CM | POA: Insufficient documentation

## 2017-04-17 DIAGNOSIS — J4540 Moderate persistent asthma, uncomplicated: Secondary | ICD-10-CM | POA: Insufficient documentation

## 2017-04-17 DIAGNOSIS — Y929 Unspecified place or not applicable: Secondary | ICD-10-CM | POA: Insufficient documentation

## 2017-04-17 DIAGNOSIS — Z91013 Allergy to seafood: Secondary | ICD-10-CM | POA: Insufficient documentation

## 2017-04-17 DIAGNOSIS — W010XXA Fall on same level from slipping, tripping and stumbling without subsequent striking against object, initial encounter: Secondary | ICD-10-CM | POA: Insufficient documentation

## 2017-04-17 DIAGNOSIS — F17210 Nicotine dependence, cigarettes, uncomplicated: Secondary | ICD-10-CM | POA: Insufficient documentation

## 2017-04-17 DIAGNOSIS — S83004A Unspecified dislocation of right patella, initial encounter: Secondary | ICD-10-CM | POA: Insufficient documentation

## 2017-04-17 DIAGNOSIS — Z79899 Other long term (current) drug therapy: Secondary | ICD-10-CM | POA: Insufficient documentation

## 2017-04-17 MED ORDER — TRAMADOL HCL 50 MG PO TABS: 50.0000 mg | ORAL_TABLET | Freq: Four times a day (QID) | ORAL | 0 refills | Status: DC | PRN

## 2017-04-17 MED ORDER — IBUPROFEN 600 MG PO TABS: 600.0000 mg | ORAL_TABLET | Freq: Four times a day (QID) | ORAL | 0 refills | Status: DC | PRN

## 2017-04-17 NOTE — Discharge Instructions (Signed)
Rest - please stay off right leg as much as possible Ice - ice for 20 minutes at a time, several times a day Compression - wear brace to provide support Elevate - elevate leg above level of heart to reduce swelling Ibuprofen 600mg  - take with food. Take up to 3-4 times daily Take pain medicine as needed
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
## 2017-04-17 NOTE — ED Triage Notes (Signed)
The pt injured her  Rt knee earlier today playing basketball  Her knee cap went to the side then popped back into place  lmp now

## 2017-07-25 ENCOUNTER — Ambulatory Visit (INDEPENDENT_AMBULATORY_CARE_PROVIDER_SITE_OTHER): Payer: Self-pay | Admitting: Family Medicine

## 2017-07-25 VITALS — BP 102/70 | HR 71 | Temp 98.2°F | Ht 69.0 in | Wt 140.2 lb

## 2017-07-25 DIAGNOSIS — J029 Acute pharyngitis, unspecified: Secondary | ICD-10-CM

## 2017-07-25 LAB — POCT RAPID STREP A (OFFICE): RAPID STREP A SCREEN: NEGATIVE

## 2017-07-25 MED ORDER — OMEPRAZOLE 40 MG PO CPDR: 40.0000 mg | DELAYED_RELEASE_CAPSULE | Freq: Every day | ORAL | 3 refills | Status: DC

## 2017-07-25 MED ORDER — CETIRIZINE HCL 10 MG PO CAPS: 10.0000 mg | ORAL_CAPSULE | Freq: Every day | ORAL | 0 refills | Status: DC
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
## 2017-07-25 NOTE — Patient Instructions (Addendum)
Thank you for coming in today, it was so nice to see you! Today we talked about:    Sore throat: Take Ibuprofen or Tylenol as needed for your throat. Drink warm tea with honey.   We have started you on allergy medicine called Zyrtec to help with some of your symptoms of nasal drainage  We have also started you on a medicine to reduce the acid in her stomach, this medicine is called omeprazole  I've placed referral for you to see the ear nose and throat specialist, someone should call you within the next week to schedule this  Call [facility] or [place] orth[facility]ics to address your knee  Please follow up in 1 month for an annual physical exam with Dr. Wonda Olds. You can schedule this appointment at the front desk before you leave or call the clinic.  Bring in all your medications or supplements to each appointment for review.   If we ordered any tests today, you will be notified via telephone of any abnormalities. If everything is normal you will get a letter in the mail.   If you have any questions or concerns, please do not hesitate to call the office at [phone]. You can also message me directly via MyChart.   Sincerely,  Anders Simmonds, MD

## 2017-07-25 NOTE — Assessment & Plan Note (Addendum)
Patient has a history of recurrent sore throat for at least 8 years now. ENT exam benign today, could not appreciate any tonsilliths but patient stated she took some of them out. Sore throat likely multifactorial with history of esophageal irritation from frequent emesis (although only having 1 episode of emesis a week) vs tonsiloliths vs allergies. Less likely infectious etiology. Rapid Strep negative today, CENTOR score of 0.  - Omeprazole 40 mg daily - Zyrtec 10 mg daily - Referral to ENT per patient request - Asked patient to schedule CPE with PCP, she will need a pap smear

## 2017-07-29 ENCOUNTER — Encounter (HOSPITAL_COMMUNITY): Payer: Self-pay

## 2017-07-29 ENCOUNTER — Emergency Department (HOSPITAL_COMMUNITY)
Admission: EM | Admit: 2017-07-29 | Discharge: 2017-07-29 | Disposition: A | Discharge status: Home / Self Care | Payer: Self-pay | Attending: Emergency Medicine | Admitting: Emergency Medicine

## 2017-07-29 DIAGNOSIS — R220 Localized swelling, mass and lump, head: Secondary | ICD-10-CM | POA: Insufficient documentation

## 2017-07-29 DIAGNOSIS — Z5321 Procedure and treatment not carried out due to patient leaving prior to being seen by health care provider: Secondary | ICD-10-CM | POA: Insufficient documentation

## 2017-07-29 MED ORDER — PREDNISONE 20 MG PO TABS
60.0000 mg | ORAL_TABLET | Freq: Once | ORAL | Status: AC
  Administered 2017-07-29: 60 mg via ORAL
  Filled 2017-07-29: qty 3

## 2017-07-29 MED ORDER — DIPHENHYDRAMINE HCL 25 MG PO CAPS
ORAL_CAPSULE | ORAL | Status: AC
  Filled 2017-07-29: qty 1

## 2017-07-29 MED ORDER — DIPHENHYDRAMINE HCL 25 MG PO CAPS
25.0000 mg | ORAL_CAPSULE | ORAL | Status: AC
  Administered 2017-07-29: 25 mg via ORAL

## 2017-07-29 NOTE — ED Notes (Signed)
Pt did not want to wait so she left AMA.

## 2017-07-29 NOTE — ED Triage Notes (Signed)
Pt states she at seafood lastnight knowing has allergy but states she never had a severe reaction. Pt states she woke up chocking and feeling like throat was going to close and burning belly button; pt does not appear to have hives but c/o itchy throat and swelling of ear;pt does not appear to be in acute distress;

## 2017-08-01 ENCOUNTER — Emergency Department (HOSPITAL_COMMUNITY)
Admission: EM | Admit: 2017-08-01 | Discharge: 2017-08-02 | Disposition: A | Discharge status: Home / Self Care | Payer: Self-pay | Attending: Emergency Medicine | Admitting: Emergency Medicine

## 2017-08-01 ENCOUNTER — Encounter (HOSPITAL_COMMUNITY): Payer: Self-pay | Admitting: Emergency Medicine

## 2017-08-01 DIAGNOSIS — M25561 Pain in right knee: Secondary | ICD-10-CM | POA: Insufficient documentation

## 2017-08-01 DIAGNOSIS — Z79899 Other long term (current) drug therapy: Secondary | ICD-10-CM | POA: Insufficient documentation

## 2017-08-01 DIAGNOSIS — J45909 Unspecified asthma, uncomplicated: Secondary | ICD-10-CM | POA: Insufficient documentation

## 2017-08-01 DIAGNOSIS — G8929 Other chronic pain: Secondary | ICD-10-CM

## 2017-08-01 DIAGNOSIS — F17210 Nicotine dependence, cigarettes, uncomplicated: Secondary | ICD-10-CM | POA: Insufficient documentation

## 2017-08-01 NOTE — ED Triage Notes (Signed)
Pt BIB EMS for c/o knee pain after a fall. [place], and her right knee "gave out". Hx knee dislocations. Given fentanyl PTA.

## 2017-08-02 ENCOUNTER — Emergency Department (HOSPITAL_COMMUNITY): Payer: Self-pay

## 2017-08-02 MED ORDER — MELOXICAM 15 MG PO TABS: 15.0000 mg | ORAL_TABLET | Freq: Every day | ORAL | 0 refills | Status: DC

## 2017-08-02 NOTE — Discharge Instructions (Signed)
Keep knee elevated when at home as much as possible. Start knee exercises. Mobic for pain and inflammation. Follow up with family doctor.
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
## 2017-08-21 ENCOUNTER — Encounter: Payer: Self-pay | Admitting: Family Medicine

## 2017-08-21 ENCOUNTER — Other Ambulatory Visit (HOSPITAL_COMMUNITY)
Admission: RE | Admit: 2017-08-21 | Discharge: 2017-08-21 | Disposition: A | Discharge status: Home / Self Care | Payer: Self-pay | Source: Ambulatory Visit | Attending: Family Medicine | Admitting: Family Medicine

## 2017-08-21 ENCOUNTER — Ambulatory Visit (INDEPENDENT_AMBULATORY_CARE_PROVIDER_SITE_OTHER): Payer: Self-pay | Admitting: Family Medicine

## 2017-08-21 VITALS — BP 102/60 | HR 80 | Temp 98.2°F | Ht 69.0 in | Wt 144.8 lb

## 2017-08-21 DIAGNOSIS — Z113 Encounter for screening for infections with a predominantly sexual mode of transmission: Secondary | ICD-10-CM

## 2017-08-21 DIAGNOSIS — Z124 Encounter for screening for malignant neoplasm of cervix: Secondary | ICD-10-CM | POA: Insufficient documentation

## 2017-08-21 DIAGNOSIS — S83104A Unspecified dislocation of right knee, initial encounter: Secondary | ICD-10-CM | POA: Insufficient documentation

## 2017-08-21 DIAGNOSIS — J3089 Other allergic rhinitis: Secondary | ICD-10-CM

## 2017-08-21 DIAGNOSIS — Z0000 Encounter for general adult medical examination without abnormal findings: Secondary | ICD-10-CM

## 2017-08-21 DIAGNOSIS — S83104D Unspecified dislocation of right knee, subsequent encounter: Secondary | ICD-10-CM

## 2017-08-21 NOTE — Progress Notes (Signed)
Subjective:    Patient ID: Mia Johnson, female    DOB: 25-Dec-1995, 21 y.o.   MRN: [id number]   CC: here for physical exam  Overall doing well. Due for pap smear and would like to be checked for STDs as well. She is sexually active with one female partner. She used to use tobacco. Uses marijuana occasionally.   Mucous Reports productive cough worse at night and in mornings which a lot of mucous production and congestion. Denies fevers, chills, shortness of breath. She has hx of asthma but does not use inhaler, denies wheezing. No recent illnesses.   Knee issues Patient dislocated her knee in June and was told she tore all the ligaments. She was told this may happen again. Per patient her kneecap came out of place briefly during initial incident. She was unable to follow up with ortho due to lack of insurance. She reports her knee has dislocated several times since then in that her kneecap slides out of place and back into place on its own. She currently denies pain in the knee. No swelling. Able to walk on her own. Certain activities cause issues with the knee. She does not wear a brace or any other device to support the knee.  Smoking status reviewed- former smoker.  Review of Systems   Objective:  BP 102/60   Pulse 80   Temp 98.2 F (36.8 C) (Oral)   Ht 5\' 9"  (1.753 m)   Wt 144 lb 12.8 oz (65.7 kg)   LMP 08/01/2017 (Approximate)   SpO2 99%   BMI 21.38 kg/m  Vitals and nursing note reviewed  General: well nourished, in no acute distress HEENT: normocephalic, no scleral icterus or conjunctival pallor, no nasal discharge but with boggy nasal turbinates bilaterally, moist mucous membranes, good dentition without erythema or discharge noted in posterior oropharynx Neck: supple, non-tender, without lymphadenopathy Cardiac: RRR, clear S1 and S2, no murmurs, rubs, or gallops Respiratory: clear to auscultation bilaterally, no increased work of breathing Abdomen: soft,  nontender, nondistended, no masses or organomegaly. Bowel sounds present GU: normal female external genitalia, no vaginal discharge, no rashes or lesions noted. Cervix pink with mild erythematous area around cervical os. No CMT, no adnexal masses appreciated on bimanual exam Extremities: no edema or cyanosis. Stable knee joints bilaterally. Pain with valgus and varus stress of right knee. No joint effusion or deformity appreciated. Neurovascularly intact distally.  Skin: warm and dry, no rashes noted Neuro: alert and oriented, no focal deficits   Assessment & Plan:    Healthcare maintenance  Patient declined flu shot Pap smear done today Gc/chlamydia sent to lab  Allergic rhinitis  Patient with seasonal allergies  -advised daily antihistamine OTC or flonase for allergies -advised mucinex for cough/congestion -follow up as needed   Dislocation of right knee  Patient dislocated knee and tore ligaments, has had this happen again several times. Knee joint stable on exam however patient tender to valgus/varus stress.  -referred to sports medicine for follow up    Return as needed.   Dolores Patty, DO Family Medicine Resident PGY-2

## 2017-08-21 NOTE — Assessment & Plan Note (Signed)
  Patient declined flu shot Pap smear done today Gc/chlamydia sent to lab

## 2017-08-21 NOTE — Assessment & Plan Note (Signed)
  Patient with seasonal allergies  -advised daily antihistamine OTC or flonase for allergies -advised mucinex for cough/congestion -follow up as needed

## 2017-08-21 NOTE — Patient Instructions (Signed)
   It was great seeing you today!  I have referred you to sports medicine for your knee issue. You will get a call to schedule an appointment with them. If you do not hear from us after 2 weeks please call our office to ask about the status of your referral.  Please get mucinex over the counter to help thin up mucous. Please also get a daily allergy medication- either generic zyrtec or allegra should be fine. If these make you drowsy or you would prefer a nasal spray please get Flonase instead.   If you have questions or concerns please do not hesitate to call at [phone].  Dolores PattyAngela Riccio, DO PGY-2, [facility] 08/21/2017 9:49 AM

## 2017-08-21 NOTE — Assessment & Plan Note (Addendum)
  Patient dislocated knee and tore ligaments, has had this happen again several times. Knee joint stable on exam however patient tender to valgus/varus stress.  -referred to sports medicine for follow up as fellowship program may be able to work with patient financially

## 2017-08-22 ENCOUNTER — Encounter: Payer: Self-pay | Admitting: Family Medicine

## 2017-08-22 LAB — CYTOLOGY - PAP: Diagnosis: NEGATIVE

## 2017-08-22 LAB — CERVICOVAGINAL ANCILLARY ONLY
Chlamydia: NEGATIVE
Neisseria Gonorrhea: NEGATIVE
TRICH (WINDOWPATH): NEGATIVE

## 2017-08-25 ENCOUNTER — Emergency Department (HOSPITAL_COMMUNITY)
Admission: EM | Admit: 2017-08-25 | Discharge: 2017-08-25 | Disposition: A | Discharge status: Home / Self Care | Payer: Self-pay | Attending: Emergency Medicine | Admitting: Emergency Medicine

## 2017-08-25 ENCOUNTER — Emergency Department (HOSPITAL_COMMUNITY): Payer: Self-pay

## 2017-08-25 DIAGNOSIS — Y939 Activity, unspecified: Secondary | ICD-10-CM | POA: Insufficient documentation

## 2017-08-25 DIAGNOSIS — M25561 Pain in right knee: Secondary | ICD-10-CM | POA: Insufficient documentation

## 2017-08-25 DIAGNOSIS — Y999 Unspecified external cause status: Secondary | ICD-10-CM | POA: Insufficient documentation

## 2017-08-25 DIAGNOSIS — Z79899 Other long term (current) drug therapy: Secondary | ICD-10-CM | POA: Insufficient documentation

## 2017-08-25 DIAGNOSIS — S0992XA Unspecified injury of nose, initial encounter: Secondary | ICD-10-CM | POA: Insufficient documentation

## 2017-08-25 DIAGNOSIS — T07XXXA Unspecified multiple injuries, initial encounter: Secondary | ICD-10-CM

## 2017-08-25 DIAGNOSIS — T148XXA Other injury of unspecified body region, initial encounter: Secondary | ICD-10-CM | POA: Insufficient documentation

## 2017-08-25 DIAGNOSIS — Y929 Unspecified place or not applicable: Secondary | ICD-10-CM | POA: Insufficient documentation

## 2017-08-25 DIAGNOSIS — F17210 Nicotine dependence, cigarettes, uncomplicated: Secondary | ICD-10-CM | POA: Insufficient documentation

## 2017-08-25 DIAGNOSIS — J4540 Moderate persistent asthma, uncomplicated: Secondary | ICD-10-CM | POA: Insufficient documentation

## 2017-08-25 LAB — I-STAT BETA HCG BLOOD, ED (MC, WL, AP ONLY)

## 2017-08-25 MED ORDER — IBUPROFEN 200 MG PO TABS
600.0000 mg | ORAL_TABLET | Freq: Once | ORAL | Status: AC
Start: 2017-08-25 — End: 2017-08-25
  Administered 2017-08-25: 600 mg via ORAL
  Filled 2017-08-25: qty 1

## 2017-08-25 NOTE — Progress Notes (Signed)
Orthopedic Tech Progress Note Patient Details:  Mia Johnson 01/23/1996 [id number]  Ortho Devices Type of Ortho Device: Crutches, Knee Immobilizer Ortho Device/Splint Location: Right Ortho Device/Splint Interventions: Application   Alvina ChouWilliams, Roland Lipke C 08/25/2017, 11:20 PM
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
## 2017-08-25 NOTE — Discharge Instructions (Signed)
Please read instructions below. Apply ice to your knee and nose for 20 minutes at a time. Elevate your right leg when possible. Keep the brace on at all times until you follow up with orthopedic specialist. You can take Advil/ibuprofen every 6 hours as needed for pain. Keep your wounds clean. Attend your appointment with the sports medicine clinic on Wednesday and follow-up on your injury. Schedule an appointment with your primary care provider to follow up on your nose injury. Return to the ER for severe headache, vision changes, inability to breathe through your nose, or new or concerning symptoms.

## 2017-08-27 ENCOUNTER — Emergency Department (HOSPITAL_COMMUNITY): Payer: Self-pay

## 2017-08-27 ENCOUNTER — Encounter (HOSPITAL_COMMUNITY): Payer: Self-pay | Admitting: *Deleted

## 2017-08-27 ENCOUNTER — Ambulatory Visit (INDEPENDENT_AMBULATORY_CARE_PROVIDER_SITE_OTHER): Payer: Self-pay | Admitting: Family Medicine

## 2017-08-27 ENCOUNTER — Emergency Department (HOSPITAL_COMMUNITY)
Admission: EM | Admit: 2017-08-27 | Discharge: 2017-08-27 | Disposition: A | Discharge status: Home / Self Care | Payer: Self-pay | Attending: Emergency Medicine | Admitting: Emergency Medicine

## 2017-08-27 DIAGNOSIS — Y939 Activity, unspecified: Secondary | ICD-10-CM | POA: Insufficient documentation

## 2017-08-27 DIAGNOSIS — Y929 Unspecified place or not applicable: Secondary | ICD-10-CM | POA: Insufficient documentation

## 2017-08-27 DIAGNOSIS — F17210 Nicotine dependence, cigarettes, uncomplicated: Secondary | ICD-10-CM | POA: Insufficient documentation

## 2017-08-27 DIAGNOSIS — J45909 Unspecified asthma, uncomplicated: Secondary | ICD-10-CM | POA: Insufficient documentation

## 2017-08-27 DIAGNOSIS — Z79899 Other long term (current) drug therapy: Secondary | ICD-10-CM | POA: Insufficient documentation

## 2017-08-27 DIAGNOSIS — R51 Headache: Secondary | ICD-10-CM | POA: Insufficient documentation

## 2017-08-27 DIAGNOSIS — M25561 Pain in right knee: Secondary | ICD-10-CM

## 2017-08-27 DIAGNOSIS — Y999 Unspecified external cause status: Secondary | ICD-10-CM | POA: Insufficient documentation

## 2017-08-27 DIAGNOSIS — S022XXA Fracture of nasal bones, initial encounter for closed fracture: Secondary | ICD-10-CM | POA: Insufficient documentation

## 2017-08-27 MED ORDER — TRAMADOL HCL 50 MG PO TABS: 50.0000 mg | ORAL_TABLET | Freq: Four times a day (QID) | ORAL | 0 refills | Status: DC | PRN
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
## 2017-08-27 NOTE — Patient Instructions (Signed)
As soon as you get health insurance call us to let us know so we can get and MRI of your right knee scheduled.  Wear knee brace daily. Gentle range of motion of knee and home exercise you were given daily. Call with any questions.
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
## 2017-08-27 NOTE — Discharge Instructions (Signed)
Continue to take Mobic daily. You may take tramadol as needed for breakthrough pain. Follow-up with your primary care doctor for further evaluation of your symptoms as needed. He may follow-up with Dr. Annalee GentaShoemaker from ENT for evaluation of your nose as needed. Return to the emergency room if you develop worsening vision changes, persistent vomiting, slurred speech, or any new or worsening symptoms.
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
## 2017-08-29 ENCOUNTER — Telehealth: Payer: Self-pay | Admitting: *Deleted

## 2017-08-29 NOTE — Telephone Encounter (Signed)
She can take tylenol and ibuprofen, but I can't give her anything stronger over the phone. If she's still in [facility]. Thanks.
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
## 2017-08-29 NOTE — Telephone Encounter (Signed)
Patient left message on nurse line requesting alternative pain med to tramadol. Was seen in ED 2 days ago for nasal fx and given tramadol but it is making her nauseated. Kinnie FeilL. Wetona Viramontes, RN, BSN

## 2017-09-08 ENCOUNTER — Encounter (HOSPITAL_COMMUNITY): Payer: Self-pay | Admitting: Emergency Medicine

## 2017-09-08 ENCOUNTER — Ambulatory Visit (HOSPITAL_COMMUNITY)
Admission: EM | Admit: 2017-09-08 | Discharge: 2017-09-08 | Disposition: A | Discharge status: Home / Self Care | Payer: Self-pay | Attending: Emergency Medicine | Admitting: Emergency Medicine

## 2017-09-08 DIAGNOSIS — N898 Other specified noninflammatory disorders of vagina: Secondary | ICD-10-CM | POA: Insufficient documentation

## 2017-09-08 DIAGNOSIS — F4310 Post-traumatic stress disorder, unspecified: Secondary | ICD-10-CM | POA: Insufficient documentation

## 2017-09-08 DIAGNOSIS — R300 Dysuria: Secondary | ICD-10-CM | POA: Insufficient documentation

## 2017-09-08 DIAGNOSIS — Z79899 Other long term (current) drug therapy: Secondary | ICD-10-CM | POA: Insufficient documentation

## 2017-09-08 DIAGNOSIS — F1020 Alcohol dependence, uncomplicated: Secondary | ICD-10-CM | POA: Insufficient documentation

## 2017-09-08 DIAGNOSIS — F17210 Nicotine dependence, cigarettes, uncomplicated: Secondary | ICD-10-CM | POA: Insufficient documentation

## 2017-09-08 DIAGNOSIS — F329 Major depressive disorder, single episode, unspecified: Secondary | ICD-10-CM | POA: Insufficient documentation

## 2017-09-08 DIAGNOSIS — J4540 Moderate persistent asthma, uncomplicated: Secondary | ICD-10-CM | POA: Insufficient documentation

## 2017-09-08 LAB — POCT PREGNANCY, URINE: PREG TEST UR: NEGATIVE

## 2017-09-08 MED ORDER — DOCUSATE SODIUM 50 MG PO CAPS
50.0000 mg | ORAL_CAPSULE | Freq: Two times a day (BID) | ORAL | 0 refills | Status: DC
Start: 2017-09-08 — End: 2018-07-05

## 2017-09-08 MED ORDER — POLYETHYLENE GLYCOL 3350 17 G PO PACK: 17.0000 g | PACK | Freq: Every day | ORAL | 0 refills | Status: DC

## 2017-09-08 NOTE — ED Triage Notes (Signed)
Pt here for UTI sx and vaginal discharge  

## 2017-09-08 NOTE — Discharge Instructions (Signed)
Urine negative for infection. Start colace and miralax to help with constipation. Discontinue use of new body wash, as it can be the cause of some of your symptoms. Cytology sent, you will be contacted with any positive results that requires further treatment. Refrain from sexual activity for the next 7 days. Monitor for any worsening of symptoms, fever, abdominal pain, nausea, vomiting, to follow up for reevaluation.
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
## 2017-09-09 ENCOUNTER — Telehealth (HOSPITAL_COMMUNITY): Payer: Self-pay | Admitting: Internal Medicine

## 2017-09-09 LAB — CERVICOVAGINAL ANCILLARY ONLY
BACTERIAL VAGINITIS: NEGATIVE
CANDIDA VAGINITIS: POSITIVE — AB
Chlamydia: NEGATIVE
NEISSERIA GONORRHEA: NEGATIVE
Trichomonas: NEGATIVE

## 2017-09-09 LAB — POCT URINALYSIS DIP (DEVICE)
GLUCOSE, UA: NEGATIVE mg/dL
LEUKOCYTES UA: NEGATIVE
NITRITE: NEGATIVE
PROTEIN: NEGATIVE mg/dL
SPECIFIC GRAVITY, URINE: 1.025 (ref 1.005–1.030)
UROBILINOGEN UA: 4 mg/dL — AB (ref 0.0–1.0)
pH: 6 (ref 5.0–8.0)

## 2017-09-09 MED ORDER — FLUCONAZOLE 150 MG PO TABS: 150.0000 mg | ORAL_TABLET | Freq: Once | ORAL | 0 refills | Status: AC

## 2017-09-09 NOTE — Telephone Encounter (Signed)
Clinical staff, please let patient know that test for candida (yeast) was positive.  Rx fluconazole was sent to the pharmacy of record, [facility] [address] [facility] [address] [place].  Recheck or followup with PCP for further evaluation if symptoms are not improving.  LM

## 2017-10-18 ENCOUNTER — Other Ambulatory Visit: Payer: Self-pay

## 2017-10-18 ENCOUNTER — Emergency Department (HOSPITAL_COMMUNITY)
Admission: EM | Admit: 2017-10-18 | Discharge: 2017-10-19 | Disposition: A | Discharge status: Home / Self Care | Payer: No Typology Code available for payment source | Attending: Emergency Medicine | Admitting: Emergency Medicine

## 2017-10-18 DIAGNOSIS — Y9389 Activity, other specified: Secondary | ICD-10-CM | POA: Diagnosis not present

## 2017-10-18 DIAGNOSIS — F17210 Nicotine dependence, cigarettes, uncomplicated: Secondary | ICD-10-CM | POA: Insufficient documentation

## 2017-10-18 DIAGNOSIS — F10920 Alcohol use, unspecified with intoxication, uncomplicated: Secondary | ICD-10-CM

## 2017-10-18 DIAGNOSIS — Z79899 Other long term (current) drug therapy: Secondary | ICD-10-CM | POA: Insufficient documentation

## 2017-10-18 DIAGNOSIS — Y92410 Unspecified street and highway as the place of occurrence of the external cause: Secondary | ICD-10-CM | POA: Insufficient documentation

## 2017-10-18 DIAGNOSIS — Y999 Unspecified external cause status: Secondary | ICD-10-CM | POA: Insufficient documentation

## 2017-10-18 DIAGNOSIS — M79604 Pain in right leg: Secondary | ICD-10-CM | POA: Diagnosis not present

## 2017-10-18 DIAGNOSIS — J45909 Unspecified asthma, uncomplicated: Secondary | ICD-10-CM | POA: Diagnosis not present

## 2017-10-18 DIAGNOSIS — S0990XA Unspecified injury of head, initial encounter: Secondary | ICD-10-CM | POA: Diagnosis not present

## 2017-10-18 MED ORDER — LORAZEPAM 2 MG/ML IJ SOLN
2.0000 mg | Freq: Once | INTRAMUSCULAR | Status: AC
  Administered 2017-10-19: 2 mg via INTRAVENOUS
  Filled 2017-10-18: qty 1
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
## 2017-10-19 ENCOUNTER — Emergency Department (HOSPITAL_COMMUNITY): Payer: No Typology Code available for payment source

## 2017-10-19 LAB — I-STAT CHEM 8, ED
BUN: 8 mg/dL (ref 6–20)
CHLORIDE: 107 mmol/L (ref 101–111)
CREATININE: 1.1 mg/dL — AB (ref 0.44–1.00)
Calcium, Ion: 1.15 mmol/L (ref 1.15–1.40)
GLUCOSE: 108 mg/dL — AB (ref 65–99)
HEMATOCRIT: 42 % (ref 36.0–46.0)
HEMOGLOBIN: 14.3 g/dL (ref 12.0–15.0)
POTASSIUM: 3.4 mmol/L — AB (ref 3.5–5.1)
Sodium: 146 mmol/L — ABNORMAL HIGH (ref 135–145)
TCO2: 23 mmol/L (ref 22–32)

## 2017-10-19 LAB — I-STAT BETA HCG BLOOD, ED (MC, WL, AP ONLY): I-stat hCG, quantitative: <5 m[IU]/mL (ref <5)

## 2017-10-19 LAB — ETHANOL: Alcohol, Ethyl (B): 207 mg/dL — ABNORMAL HIGH (ref <10)

## 2017-10-19 MED ORDER — IOPAMIDOL (ISOVUE-300) INJECTION 61%
INTRAVENOUS | Status: AC
  Administered 2017-10-19: 100 mL
  Filled 2017-10-19: qty 100

## 2017-10-19 NOTE — Discharge Instructions (Signed)
Your testing is negative for serious injury.  Reduce her drinking.  Do not drink and drive.

## 2017-10-19 NOTE — ED Notes (Signed)
Pt ambulates independently with a steady gait

## 2017-10-26 ENCOUNTER — Emergency Department (HOSPITAL_COMMUNITY)
Admission: EM | Admit: 2017-10-26 | Discharge: 2017-10-26 | Disposition: A | Discharge status: Home / Self Care | Payer: Self-pay | Attending: Emergency Medicine | Admitting: Emergency Medicine

## 2017-10-26 ENCOUNTER — Emergency Department (HOSPITAL_COMMUNITY): Payer: Self-pay

## 2017-10-26 ENCOUNTER — Other Ambulatory Visit: Payer: Self-pay

## 2017-10-26 ENCOUNTER — Encounter (HOSPITAL_COMMUNITY): Payer: Self-pay | Admitting: *Deleted

## 2017-10-26 DIAGNOSIS — Z79899 Other long term (current) drug therapy: Secondary | ICD-10-CM | POA: Insufficient documentation

## 2017-10-26 DIAGNOSIS — F17210 Nicotine dependence, cigarettes, uncomplicated: Secondary | ICD-10-CM | POA: Insufficient documentation

## 2017-10-26 DIAGNOSIS — M25561 Pain in right knee: Secondary | ICD-10-CM | POA: Insufficient documentation

## 2017-10-26 DIAGNOSIS — J4540 Moderate persistent asthma, uncomplicated: Secondary | ICD-10-CM | POA: Insufficient documentation

## 2017-10-26 MED ORDER — NAPROXEN 500 MG PO TABS: 500.0000 mg | ORAL_TABLET | Freq: Two times a day (BID) | ORAL | 0 refills | Status: DC

## 2017-10-26 MED ORDER — OXYCODONE-ACETAMINOPHEN 5-325 MG PO TABS
1.0000 | ORAL_TABLET | Freq: Once | ORAL | Status: AC
  Administered 2017-10-26: 1 via ORAL
  Filled 2017-10-26: qty 1

## 2017-10-26 MED ORDER — TRAMADOL HCL 50 MG PO TABS: 50.0000 mg | ORAL_TABLET | Freq: Four times a day (QID) | ORAL | 0 refills | Status: DC | PRN

## 2017-10-26 NOTE — ED Notes (Signed)
Patient transported to X-ray 
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
## 2017-10-26 NOTE — Discharge Instructions (Signed)
Return to the ED with any concerns including increased swelling, pain, weakness of leg, decreased level of alertness/lethargy, or any other alarming symptoms  You should wear the knee brace and use crutches until directed by Sports Medicine

## 2017-10-26 NOTE — ED Triage Notes (Signed)
Pt reports injuring right knee. Reports hx of dislocations in past but normally goes back in place by itself and pt doesn't come in. Reports this time it "feels different" and has difficulty bearing weight. Swelling noted to knee but no obv dislocation.

## 2017-11-01 ENCOUNTER — Telehealth: Payer: Self-pay | Admitting: Family Medicine

## 2017-11-01 ENCOUNTER — Emergency Department (HOSPITAL_COMMUNITY)
Admission: EM | Admit: 2017-11-01 | Discharge: 2017-11-01 | Disposition: A | Discharge status: Home / Self Care | Payer: Self-pay | Attending: Emergency Medicine | Admitting: Emergency Medicine

## 2017-11-01 ENCOUNTER — Encounter (HOSPITAL_COMMUNITY): Payer: Self-pay | Admitting: *Deleted

## 2017-11-01 DIAGNOSIS — R1013 Epigastric pain: Secondary | ICD-10-CM | POA: Insufficient documentation

## 2017-11-01 DIAGNOSIS — K625 Hemorrhage of anus and rectum: Secondary | ICD-10-CM | POA: Insufficient documentation

## 2017-11-01 DIAGNOSIS — Z79899 Other long term (current) drug therapy: Secondary | ICD-10-CM | POA: Insufficient documentation

## 2017-11-01 DIAGNOSIS — J45909 Unspecified asthma, uncomplicated: Secondary | ICD-10-CM | POA: Insufficient documentation

## 2017-11-01 DIAGNOSIS — F17210 Nicotine dependence, cigarettes, uncomplicated: Secondary | ICD-10-CM | POA: Insufficient documentation

## 2017-11-01 LAB — COMPREHENSIVE METABOLIC PANEL
ALT: 15 U/L (ref 14–54)
ANION GAP: 6 (ref 5–15)
AST: 21 U/L (ref 15–41)
Albumin: 4.3 g/dL (ref 3.5–5.0)
Alkaline Phosphatase: 61 U/L (ref 38–126)
BUN: 8 mg/dL (ref 6–20)
CHLORIDE: 104 mmol/L (ref 101–111)
CO2: 26 mmol/L (ref 22–32)
Calcium: 9.4 mg/dL (ref 8.9–10.3)
Creatinine, Ser: 0.81 mg/dL (ref 0.44–1.00)
Glucose, Bld: 99 mg/dL (ref 65–99)
POTASSIUM: 3.6 mmol/L (ref 3.5–5.1)
SODIUM: 136 mmol/L (ref 135–145)
Total Bilirubin: 0.8 mg/dL (ref 0.3–1.2)
Total Protein: 7.1 g/dL (ref 6.5–8.1)

## 2017-11-01 LAB — CBC
HEMATOCRIT: 39.9 % (ref 36.0–46.0)
HEMOGLOBIN: 13 g/dL (ref 12.0–15.0)
MCH: 27 pg (ref 26.0–34.0)
MCHC: 32.6 g/dL (ref 30.0–36.0)
MCV: 82.8 fL (ref 78.0–100.0)
Platelets: 303 10*3/uL (ref 150–400)
RBC: 4.82 MIL/uL (ref 3.87–5.11)
RDW: 12.8 % (ref 11.5–15.5)
WBC: 9.1 10*3/uL (ref 4.0–10.5)

## 2017-11-01 LAB — I-STAT BETA HCG BLOOD, ED (MC, WL, AP ONLY): I-stat hCG, quantitative: <5 m[IU]/mL (ref <5)

## 2017-11-01 MED ORDER — FAMOTIDINE 20 MG PO TABS: 20.0000 mg | ORAL_TABLET | Freq: Two times a day (BID) | ORAL | 0 refills | Status: DC

## 2017-11-01 MED ORDER — OMEPRAZOLE 20 MG PO CPDR: 20.0000 mg | DELAYED_RELEASE_CAPSULE | Freq: Every day | ORAL | 1 refills | Status: DC
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
## 2017-11-01 NOTE — Discharge Instructions (Signed)
Please read and follow all provided instructions.  Your diagnoses today include:  1. Rectal bleeding   2. Epigastric pain    Tests performed today include:  Blood counts and electrolytes - normal white and red blood cells  Blood tests to check liver and kidney function  Blood tests to check pancreas function  Urine test to look for pregnancy - negative  Vital signs. See below for your results today.   Medications prescribed:   Omeprazole (Prilosec) - stomach acid reducer  This medication can be found over-the-counter   Pepcid (famotidine) - antihistamine  You can find this medication over-the-counter.   DO NOT exceed:   20mg  Pepcid every 12 hours  Take any prescribed medications only as directed.  Home care instructions:   Follow any educational materials contained in this packet.  Follow-up instructions: Please follow-up with your primary care provider in the next 3-5 days for further evaluation of your symptoms.    Return instructions:  SEEK IMMEDIATE MEDICAL ATTENTION IF:  The pain does not go away or becomes severe   A temperature above 101F develops   Repeated vomiting occurs (multiple episodes)   The pain becomes localized to portions of the abdomen. The right side could possibly be appendicitis. In an adult, the left lower portion of the abdomen could be colitis or diverticulitis.   Worsening amount of blood is being passed in stools or vomit (bright red or black tarry stools)   You develop chest pain, difficulty breathing, dizziness or fainting, or become confused, poorly responsive, or inconsolable (young children)  If you have any other emergent concerns regarding your health  Additional Information: Abdominal (belly) pain can be caused by many things. Your caregiver performed an examination and possibly ordered blood/urine tests and imaging (CT scan, x-rays, ultrasound). Many cases can be observed and treated at home after initial evaluation in  the emergency department. Even though you are being discharged home, abdominal pain can be unpredictable. Therefore, you need a repeated exam if your pain does not resolve, returns, or worsens. Most patients with abdominal pain don't have to be admitted to the hospital or have surgery, but serious problems like appendicitis and gallbladder attacks can start out as nonspecific pain. Many abdominal conditions cannot be diagnosed in one visit, so follow-up evaluations are very important.  Your vital signs today were: BP 128/70    Pulse 67    Temp 98.2 F (36.8 C) (Oral)    Resp 16    LMP 10/19/2017    SpO2 100%  If your blood pressure (bp) was elevated above 135/85 this visit, please have this repeated by your doctor within one month. --------------
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
## 2017-11-26 ENCOUNTER — Ambulatory Visit (INDEPENDENT_AMBULATORY_CARE_PROVIDER_SITE_OTHER): Payer: Self-pay | Admitting: Internal Medicine

## 2017-11-26 ENCOUNTER — Telehealth: Payer: Self-pay | Admitting: Internal Medicine

## 2017-11-26 ENCOUNTER — Encounter: Payer: Self-pay | Admitting: Internal Medicine

## 2017-11-26 ENCOUNTER — Other Ambulatory Visit: Payer: Self-pay

## 2017-11-26 ENCOUNTER — Other Ambulatory Visit (HOSPITAL_COMMUNITY)
Admission: RE | Admit: 2017-11-26 | Discharge: 2017-11-26 | Disposition: A | Discharge status: Home / Self Care | Payer: Self-pay | Source: Ambulatory Visit | Attending: Family Medicine | Admitting: Family Medicine

## 2017-11-26 VITALS — BP 110/60 | HR 89 | Wt 138.0 lb

## 2017-11-26 DIAGNOSIS — Z113 Encounter for screening for infections with a predominantly sexual mode of transmission: Secondary | ICD-10-CM | POA: Insufficient documentation

## 2017-11-26 DIAGNOSIS — R300 Dysuria: Secondary | ICD-10-CM

## 2017-11-26 DIAGNOSIS — Z114 Encounter for screening for human immunodeficiency virus [HIV]: Secondary | ICD-10-CM | POA: Insufficient documentation

## 2017-11-26 DIAGNOSIS — N898 Other specified noninflammatory disorders of vagina: Secondary | ICD-10-CM

## 2017-11-26 LAB — POCT WET PREP (WET MOUNT)
Clue Cells Wet Prep Whiff POC: NEGATIVE
TRICHOMONAS WET PREP HPF POC: ABSENT

## 2017-11-26 LAB — POCT URINALYSIS DIP (MANUAL ENTRY)
BILIRUBIN UA: NEGATIVE
BILIRUBIN UA: NEGATIVE mg/dL
Glucose, UA: NEGATIVE mg/dL
Leukocytes, UA: NEGATIVE
NITRITE UA: NEGATIVE
PH UA: 6.5 (ref 5.0–8.0)
RBC UA: NEGATIVE
Spec Grav, UA: 1.025 (ref 1.010–1.025)
Urobilinogen, UA: 2 E.U./dL — AB

## 2017-11-26 MED ORDER — METRONIDAZOLE 500 MG PO TABS: 500.0000 mg | ORAL_TABLET | Freq: Two times a day (BID) | ORAL | 0 refills | Status: DC

## 2017-11-26 NOTE — Patient Instructions (Signed)
It was nice to meet you   I will call you with the results.

## 2017-11-26 NOTE — Telephone Encounter (Signed)
Called patient regarding wet prep positive for BV. Flagyl prescribed. Discussed side effects and avoiding alcohol during and a few days after treatment. Patient understood.

## 2017-11-27 ENCOUNTER — Encounter: Payer: Self-pay | Admitting: Internal Medicine

## 2017-11-27 LAB — CERVICOVAGINAL ANCILLARY ONLY
CHLAMYDIA, DNA PROBE: NEGATIVE
NEISSERIA GONORRHEA: NEGATIVE

## 2017-11-27 LAB — HIV ANTIBODY (ROUTINE TESTING W REFLEX): HIV Screen 4th Generation wRfx: NONREACTIVE

## 2017-11-27 LAB — RPR: RPR Ser Ql: NONREACTIVE

## 2017-12-01 ENCOUNTER — Other Ambulatory Visit: Payer: Self-pay

## 2017-12-01 ENCOUNTER — Ambulatory Visit (INDEPENDENT_AMBULATORY_CARE_PROVIDER_SITE_OTHER): Payer: Self-pay | Admitting: Family Medicine

## 2017-12-01 VITALS — BP 120/80 | HR 70 | Temp 98.4°F | Ht 69.0 in | Wt 139.0 lb

## 2017-12-01 DIAGNOSIS — J029 Acute pharyngitis, unspecified: Secondary | ICD-10-CM

## 2017-12-01 LAB — POCT RAPID STREP A (OFFICE): Rapid Strep A Screen: NEGATIVE

## 2017-12-01 NOTE — Progress Notes (Signed)
Patient ID: Mia SnideSamantha Johnson, female   DOB: 03/13/1996, 22 y.o.   MRN: [id number] Patient left office without being seen by a physician.

## 2017-12-01 NOTE — Progress Notes (Signed)
   Patient presented today complaining of a sore throat. Patient left without being seen. Patient was afrebrile. Rapid strep test that was obtained was negative. Ples SpecterAlisa Delainee Tramel, RN [facility] RN)

## 2018-02-17 ENCOUNTER — Emergency Department (HOSPITAL_COMMUNITY)
Admission: EM | Admit: 2018-02-17 | Discharge: 2018-02-18 | Disposition: A | Discharge status: Home / Self Care | Payer: Self-pay | Attending: Emergency Medicine | Admitting: Emergency Medicine

## 2018-02-17 ENCOUNTER — Encounter (HOSPITAL_COMMUNITY): Payer: Self-pay

## 2018-02-17 DIAGNOSIS — F17210 Nicotine dependence, cigarettes, uncomplicated: Secondary | ICD-10-CM | POA: Insufficient documentation

## 2018-02-17 DIAGNOSIS — K529 Noninfective gastroenteritis and colitis, unspecified: Secondary | ICD-10-CM | POA: Insufficient documentation

## 2018-02-17 LAB — COMPREHENSIVE METABOLIC PANEL
ALK PHOS: 55 U/L (ref 38–126)
ALT: 16 U/L (ref 14–54)
AST: 17 U/L (ref 15–41)
Albumin: 4.2 g/dL (ref 3.5–5.0)
Anion gap: 12 (ref 5–15)
BUN: 14 mg/dL (ref 6–20)
CALCIUM: 9.3 mg/dL (ref 8.9–10.3)
CO2: 23 mmol/L (ref 22–32)
CREATININE: 0.84 mg/dL (ref 0.44–1.00)
Chloride: 104 mmol/L (ref 101–111)
Glucose, Bld: 90 mg/dL (ref 65–99)
Potassium: 3.8 mmol/L (ref 3.5–5.1)
Sodium: 139 mmol/L (ref 135–145)
TOTAL PROTEIN: 7.3 g/dL (ref 6.5–8.1)
Total Bilirubin: 1.1 mg/dL (ref 0.3–1.2)

## 2018-02-17 LAB — CBC
HCT: 43.9 % (ref 36.0–46.0)
Hemoglobin: 14.4 g/dL (ref 12.0–15.0)
MCH: 27.9 pg (ref 26.0–34.0)
MCHC: 32.8 g/dL (ref 30.0–36.0)
MCV: 84.9 fL (ref 78.0–100.0)
PLATELETS: 313 10*3/uL (ref 150–400)
RBC: 5.17 MIL/uL — AB (ref 3.87–5.11)
RDW: 13.8 % (ref 11.5–15.5)
WBC: 6.6 10*3/uL (ref 4.0–10.5)

## 2018-02-17 LAB — I-STAT CHEM 8, ED
BUN: 13 mg/dL (ref 6–20)
Calcium, Ion: 1.14 mmol/L — ABNORMAL LOW (ref 1.15–1.40)
Chloride: 104 mmol/L (ref 101–111)
Creatinine, Ser: 0.8 mg/dL (ref 0.44–1.00)
Glucose, Bld: 78 mg/dL (ref 65–99)
HCT: 36 % (ref 36.0–46.0)
HEMOGLOBIN: 12.2 g/dL (ref 12.0–15.0)
Potassium: 3.5 mmol/L (ref 3.5–5.1)
SODIUM: 140 mmol/L (ref 135–145)
TCO2: 24 mmol/L (ref 22–32)

## 2018-02-17 LAB — I-STAT BETA HCG BLOOD, ED (MC, WL, AP ONLY)

## 2018-02-17 LAB — LIPASE, BLOOD: LIPASE: 19 U/L (ref 11–51)

## 2018-02-17 MED ORDER — FAMOTIDINE IN NACL 20-0.9 MG/50ML-% IV SOLN
20.0000 mg | Freq: Once | INTRAVENOUS | Status: AC
  Administered 2018-02-17: 20 mg via INTRAVENOUS
  Filled 2018-02-17: qty 50

## 2018-02-17 MED ORDER — ONDANSETRON HCL 4 MG/2ML IJ SOLN
4.0000 mg | Freq: Once | INTRAMUSCULAR | Status: AC
  Administered 2018-02-17: 4 mg via INTRAVENOUS
  Filled 2018-02-17: qty 2

## 2018-02-17 MED ORDER — SODIUM CHLORIDE 0.9 % IV BOLUS
1000.0000 mL | Freq: Once | INTRAVENOUS | Status: AC
  Administered 2018-02-17: 1000 mL via INTRAVENOUS

## 2018-02-17 NOTE — ED Triage Notes (Signed)
Pt complains of vomiting several times and having diarrhea since about 5am, she states that she ate [employer] nuggets and didn't think they were cooked all the way

## 2018-02-18 MED ORDER — PROMETHAZINE HCL 25 MG PO TABS: 25.0000 mg | ORAL_TABLET | Freq: Four times a day (QID) | ORAL | 0 refills | Status: DC | PRN

## 2018-02-18 NOTE — Discharge Instructions (Signed)
Avoid fried foods, fatty foods, greasy foods, and milk products until symptoms resolve. Be sure your child drinks plenty of clear liquids. We recommend the use of Phenergan as prescribed for nausea/vomiting. Follow-up with your primary care doctor to ensure resolution of symptoms.
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
## 2018-02-19 ENCOUNTER — Other Ambulatory Visit: Payer: Self-pay

## 2018-02-19 ENCOUNTER — Encounter (HOSPITAL_COMMUNITY): Payer: Self-pay | Admitting: Emergency Medicine

## 2018-02-19 ENCOUNTER — Emergency Department (HOSPITAL_COMMUNITY)
Admission: EM | Admit: 2018-02-19 | Discharge: 2018-02-19 | Disposition: A | Discharge status: Home / Self Care | Payer: Self-pay | Attending: Emergency Medicine | Admitting: Emergency Medicine

## 2018-02-19 DIAGNOSIS — F17210 Nicotine dependence, cigarettes, uncomplicated: Secondary | ICD-10-CM | POA: Insufficient documentation

## 2018-02-19 DIAGNOSIS — Z79899 Other long term (current) drug therapy: Secondary | ICD-10-CM | POA: Insufficient documentation

## 2018-02-19 DIAGNOSIS — R1033 Periumbilical pain: Secondary | ICD-10-CM | POA: Insufficient documentation

## 2018-02-19 DIAGNOSIS — J4540 Moderate persistent asthma, uncomplicated: Secondary | ICD-10-CM | POA: Insufficient documentation

## 2018-02-19 HISTORY — DX: Cyclical vomiting syndrome unrelated to migraine: R11.15

## 2018-02-19 MED ORDER — OMEPRAZOLE 20 MG PO CPDR: 20.0000 mg | DELAYED_RELEASE_CAPSULE | Freq: Every day | ORAL | 0 refills | Status: DC

## 2018-02-19 MED ORDER — HALOPERIDOL 0.5 MG PO TABS
2.0000 mg | ORAL_TABLET | Freq: Once | ORAL | Status: AC
  Administered 2018-02-19: 2 mg via ORAL
  Filled 2018-02-19: qty 4

## 2018-02-19 MED ORDER — DICYCLOMINE HCL 10 MG/ML IM SOLN
20.0000 mg | Freq: Once | INTRAMUSCULAR | Status: AC
  Administered 2018-02-19: 20 mg via INTRAMUSCULAR
  Filled 2018-02-19: qty 2

## 2018-02-19 MED ORDER — CALCIUM CARBONATE ANTACID 750 MG PO CHEW: 1.0000 | CHEWABLE_TABLET | Freq: Three times a day (TID) | ORAL | 0 refills | Status: DC
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
## 2018-02-19 NOTE — Discharge Instructions (Signed)
As discussed, with your ongoing abdominal pain, and history of cyclic vomiting syndrome is very important and she will follow-up with your physician, with a primary care or gastroenterology. Please use the provided resources, including community health and wellness center to assist with facilitation of follow-up.  Medication provided today, may take up to 3 days for benefit.  Return here for any concerning changes in your condition.

## 2018-02-19 NOTE — ED Triage Notes (Signed)
Pt was seen here on Tuesday with abd pain thought she had food poisoning  Pt states she was given IVF and some medication for her stomach  Pt states yesterday the pain was intermittent throughout the day but this morning since around 4am the pain has been constant like sharp cramping and has had some vomiting  Pt states the pain is at the umbilical region and below

## 2018-07-05 ENCOUNTER — Other Ambulatory Visit: Payer: Self-pay

## 2018-07-05 ENCOUNTER — Encounter (HOSPITAL_COMMUNITY): Payer: Self-pay

## 2018-07-05 ENCOUNTER — Ambulatory Visit (HOSPITAL_COMMUNITY)
Admission: EM | Admit: 2018-07-05 | Discharge: 2018-07-05 | Disposition: A | Discharge status: Home / Self Care | Payer: Self-pay | Attending: Family Medicine | Admitting: Family Medicine

## 2018-07-05 ENCOUNTER — Ambulatory Visit (INDEPENDENT_AMBULATORY_CARE_PROVIDER_SITE_OTHER): Payer: Self-pay

## 2018-07-05 DIAGNOSIS — M25532 Pain in left wrist: Secondary | ICD-10-CM

## 2018-07-05 DIAGNOSIS — S60212A Contusion of left wrist, initial encounter: Secondary | ICD-10-CM
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
## 2018-07-05 NOTE — ED Notes (Signed)
donjoy spica given
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
## 2018-07-05 NOTE — Discharge Instructions (Addendum)
The x-rays show no fracture.  There is no dislocation either.  Instead what you have is bruising over the bones and soft tissues of the left wrist.  We usually treat this with 5 days of the wrist splint, ice, and ibuprofen.  Since you had trouble with your stomach (hyperacidity), you can substitute Tylenol for the ibuprofen.  To protect your stomach from the over the counter ibuprofen, you should take Omeprazole 20 mg daily.  This is also available over the counter.

## 2018-08-31 ENCOUNTER — Emergency Department (HOSPITAL_COMMUNITY)
Admission: EM | Admit: 2018-08-31 | Discharge: 2018-08-31 | Disposition: A | Discharge status: Home / Self Care | Payer: Self-pay | Attending: Emergency Medicine | Admitting: Emergency Medicine

## 2018-08-31 ENCOUNTER — Other Ambulatory Visit: Payer: Self-pay

## 2018-08-31 ENCOUNTER — Encounter (HOSPITAL_COMMUNITY): Payer: Self-pay | Admitting: *Deleted

## 2018-08-31 ENCOUNTER — Emergency Department (HOSPITAL_COMMUNITY): Payer: Self-pay

## 2018-08-31 DIAGNOSIS — Y939 Activity, unspecified: Secondary | ICD-10-CM | POA: Insufficient documentation

## 2018-08-31 DIAGNOSIS — M545 Low back pain, unspecified: Secondary | ICD-10-CM

## 2018-08-31 DIAGNOSIS — Z79899 Other long term (current) drug therapy: Secondary | ICD-10-CM | POA: Insufficient documentation

## 2018-08-31 DIAGNOSIS — Y999 Unspecified external cause status: Secondary | ICD-10-CM | POA: Insufficient documentation

## 2018-08-31 DIAGNOSIS — Y929 Unspecified place or not applicable: Secondary | ICD-10-CM | POA: Insufficient documentation

## 2018-08-31 DIAGNOSIS — T148XXA Other injury of unspecified body region, initial encounter: Secondary | ICD-10-CM

## 2018-08-31 DIAGNOSIS — M546 Pain in thoracic spine: Secondary | ICD-10-CM

## 2018-08-31 DIAGNOSIS — S22000A Wedge compression fracture of unspecified thoracic vertebra, initial encounter for closed fracture: Secondary | ICD-10-CM | POA: Insufficient documentation

## 2018-08-31 DIAGNOSIS — J45909 Unspecified asthma, uncomplicated: Secondary | ICD-10-CM | POA: Insufficient documentation

## 2018-08-31 DIAGNOSIS — F17210 Nicotine dependence, cigarettes, uncomplicated: Secondary | ICD-10-CM | POA: Insufficient documentation

## 2018-08-31 LAB — URINALYSIS, ROUTINE W REFLEX MICROSCOPIC
Bilirubin Urine: NEGATIVE
Glucose, UA: NEGATIVE mg/dL
Hgb urine dipstick: NEGATIVE
Ketones, ur: NEGATIVE mg/dL
LEUKOCYTES UA: NEGATIVE
Nitrite: NEGATIVE
PH: 6 (ref 5.0–8.0)
Protein, ur: NEGATIVE mg/dL
SPECIFIC GRAVITY, URINE: 1.023 (ref 1.005–1.030)

## 2018-08-31 LAB — POC URINE PREG, ED: PREG TEST UR: NEGATIVE

## 2018-08-31 MED ORDER — KETOROLAC TROMETHAMINE 60 MG/2ML IM SOLN
60.0000 mg | Freq: Once | INTRAMUSCULAR | Status: AC
  Administered 2018-08-31: 60 mg via INTRAMUSCULAR
  Filled 2018-08-31: qty 2

## 2018-08-31 MED ORDER — CYCLOBENZAPRINE HCL 10 MG PO TABS: 10.0000 mg | ORAL_TABLET | Freq: Three times a day (TID) | ORAL | 0 refills | Status: DC

## 2018-08-31 NOTE — ED Triage Notes (Addendum)
Pt in c/o mid back pain that started about a week ago, pain is worse when laying down and she feels like her back is tightening up, reports mild cough as well, no distress noted, movement increases pain

## 2018-08-31 NOTE — ED Triage Notes (Signed)
Pt reports coughing and shortness of breath when laying flat, states she feels like she is choking at times
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
## 2018-08-31 NOTE — ED Notes (Signed)
Patient transported to X-ray 

## 2018-09-01 LAB — URINE CULTURE

## 2018-09-02 ENCOUNTER — Telehealth: Payer: Self-pay | Admitting: *Deleted

## 2018-09-02 NOTE — Telephone Encounter (Signed)
Post ED Visit - Positive Culture Follow-up  Culture report reviewed by antimicrobial stewardship pharmacist:  []  Enzo Bi, Pharm.D. []  Celedonio Miyamoto, Pharm.D., BCPS AQ-ID []  Garvin Fila, Pharm.D., BCPS []  Georgina Pillion, Pharm.D., BCPS []  [place], [place].D., BCPS, AAHIVP []  Estella Husk, Pharm.D., BCPS, AAHIVP []  Lysle Pearl, PharmD, BCPS []  Phillips Climes, PharmD, BCPS []  Agapito Games, PharmD, BCPS []  Verlan Friends, PharmD  Positive urine culture, reviewed by Arthor Captain, PA-C and no further patient follow-up is required at this time.  Virl Axe [facility] 09/02/2018, 10:32 AM

## 2018-09-14 ENCOUNTER — Encounter (HOSPITAL_COMMUNITY): Payer: Self-pay

## 2018-09-14 ENCOUNTER — Emergency Department (HOSPITAL_COMMUNITY)
Admission: EM | Admit: 2018-09-14 | Discharge: 2018-09-14 | Disposition: A | Discharge status: Home / Self Care | Payer: Self-pay | Attending: Emergency Medicine | Admitting: Emergency Medicine

## 2018-09-14 ENCOUNTER — Emergency Department (HOSPITAL_COMMUNITY): Payer: Self-pay

## 2018-09-14 DIAGNOSIS — F17210 Nicotine dependence, cigarettes, uncomplicated: Secondary | ICD-10-CM | POA: Insufficient documentation

## 2018-09-14 DIAGNOSIS — Y9289 Other specified places as the place of occurrence of the external cause: Secondary | ICD-10-CM | POA: Insufficient documentation

## 2018-09-14 DIAGNOSIS — S29019D Strain of muscle and tendon of unspecified wall of thorax, subsequent encounter: Secondary | ICD-10-CM

## 2018-09-14 DIAGNOSIS — Z79899 Other long term (current) drug therapy: Secondary | ICD-10-CM | POA: Insufficient documentation

## 2018-09-14 DIAGNOSIS — X500XXD Overexertion from strenuous movement or load, subsequent encounter: Secondary | ICD-10-CM | POA: Insufficient documentation

## 2018-09-14 DIAGNOSIS — Y9389 Activity, other specified: Secondary | ICD-10-CM | POA: Insufficient documentation

## 2018-09-14 DIAGNOSIS — Y999 Unspecified external cause status: Secondary | ICD-10-CM | POA: Insufficient documentation

## 2018-09-14 DIAGNOSIS — S29011D Strain of muscle and tendon of front wall of thorax, subsequent encounter: Secondary | ICD-10-CM | POA: Insufficient documentation

## 2018-09-14 MED ORDER — METHOCARBAMOL 500 MG PO TABS: 500.0000 mg | ORAL_TABLET | Freq: Three times a day (TID) | ORAL | 0 refills | Status: DC | PRN

## 2018-09-14 MED ORDER — METHOCARBAMOL 500 MG PO TABS
500.0000 mg | ORAL_TABLET | Freq: Once | ORAL | Status: AC
  Administered 2018-09-14: 500 mg via ORAL
  Filled 2018-09-14: qty 1

## 2018-09-14 MED ORDER — IBUPROFEN 800 MG PO TABS: 800.0000 mg | ORAL_TABLET | Freq: Four times a day (QID) | ORAL | 0 refills | Status: DC | PRN

## 2018-09-14 MED ORDER — TRAMADOL HCL 50 MG PO TABS: 50.0000 mg | ORAL_TABLET | Freq: Four times a day (QID) | ORAL | 0 refills | Status: DC | PRN

## 2018-09-14 MED ORDER — OXYCODONE-ACETAMINOPHEN 5-325 MG PO TABS
1.0000 | ORAL_TABLET | Freq: Once | ORAL | Status: AC
  Administered 2018-09-14: 1 via ORAL
  Filled 2018-09-14: qty 1

## 2018-09-14 MED ORDER — IBUPROFEN 800 MG PO TABS
800.0000 mg | ORAL_TABLET | Freq: Once | ORAL | Status: AC
  Administered 2018-09-14: 800 mg via ORAL
  Filled 2018-09-14: qty 1
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
## 2018-10-08 ENCOUNTER — Other Ambulatory Visit: Payer: Self-pay

## 2018-10-08 ENCOUNTER — Ambulatory Visit (HOSPITAL_COMMUNITY)
Admission: EM | Admit: 2018-10-08 | Discharge: 2018-10-08 | Disposition: A | Discharge status: Home / Self Care | Payer: Self-pay | Attending: Family Medicine | Admitting: Family Medicine

## 2018-10-08 ENCOUNTER — Encounter (HOSPITAL_COMMUNITY): Payer: Self-pay | Admitting: Emergency Medicine

## 2018-10-08 DIAGNOSIS — N760 Acute vaginitis: Secondary | ICD-10-CM

## 2018-10-08 DIAGNOSIS — B9689 Other specified bacterial agents as the cause of diseases classified elsewhere: Secondary | ICD-10-CM

## 2018-10-08 LAB — POCT URINALYSIS DIP (DEVICE)
Bilirubin Urine: NEGATIVE
Glucose, UA: NEGATIVE mg/dL
HGB URINE DIPSTICK: NEGATIVE
KETONES UR: NEGATIVE mg/dL
Leukocytes, UA: NEGATIVE
Nitrite: NEGATIVE
PH: 6.5 (ref 5.0–8.0)
Protein, ur: NEGATIVE mg/dL
SPECIFIC GRAVITY, URINE: 1.025 (ref 1.005–1.030)
Urobilinogen, UA: 1 mg/dL (ref 0.0–1.0)

## 2018-10-08 LAB — POCT PREGNANCY, URINE: PREG TEST UR: NEGATIVE

## 2018-10-08 MED ORDER — METRONIDAZOLE 500 MG PO TABS: 500.0000 mg | ORAL_TABLET | Freq: Two times a day (BID) | ORAL | 0 refills | Status: DC

## 2018-10-08 NOTE — Discharge Instructions (Signed)
Take the antibiotic 2 x a day for 7 days no alcohol See GYN If you continue to have irregular periods Consider probiotic like activia yogurt or lactobacillus probiotic pill daily for prevention
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
## 2018-10-08 NOTE — ED Triage Notes (Signed)
Vaginal discharge for a week.  Reports discharge is thin, watery, white.  Patient is having abdominal cramping. Abdominal cramping started 2 days ago.

## 2018-10-21 ENCOUNTER — Other Ambulatory Visit: Payer: Self-pay

## 2018-10-21 ENCOUNTER — Ambulatory Visit (HOSPITAL_COMMUNITY)
Admission: EM | Admit: 2018-10-21 | Discharge: 2018-10-21 | Disposition: A | Discharge status: Home / Self Care | Payer: Medicaid Other | Attending: Family Medicine | Admitting: Family Medicine

## 2018-10-21 ENCOUNTER — Encounter (HOSPITAL_COMMUNITY): Payer: Self-pay | Admitting: Emergency Medicine

## 2018-10-21 DIAGNOSIS — Z3202 Encounter for pregnancy test, result negative: Secondary | ICD-10-CM

## 2018-10-21 DIAGNOSIS — R1032 Left lower quadrant pain: Secondary | ICD-10-CM | POA: Insufficient documentation

## 2018-10-21 DIAGNOSIS — R319 Hematuria, unspecified: Secondary | ICD-10-CM

## 2018-10-21 LAB — POCT URINALYSIS DIP (DEVICE)
GLUCOSE, UA: NEGATIVE mg/dL
Hgb urine dipstick: NEGATIVE
KETONES UR: NEGATIVE mg/dL
Leukocytes, UA: NEGATIVE
Nitrite: NEGATIVE
Protein, ur: NEGATIVE mg/dL
Specific Gravity, Urine: 1.025 (ref 1.005–1.030)
Urobilinogen, UA: 2 mg/dL — ABNORMAL HIGH (ref 0.0–1.0)
pH: 6 (ref 5.0–8.0)

## 2018-10-21 LAB — POCT PREGNANCY, URINE: Preg Test, Ur: NEGATIVE
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
## 2018-10-21 NOTE — Discharge Instructions (Addendum)
Continue to drink plenty of fluids.  Urine test is normal.  There is no sign of infection.

## 2018-10-21 NOTE — ED Triage Notes (Signed)
PT was arrested and detained for 2ish days. PT reports she did not eat, drink, or use the bathroom while she was detained. She was released yesterday. PT has eaten and drank a little since then. PT reports blood in urine and low urine volume.

## 2018-11-05 ENCOUNTER — Encounter (HOSPITAL_COMMUNITY): Payer: Self-pay | Admitting: Emergency Medicine

## 2018-11-05 ENCOUNTER — Other Ambulatory Visit: Payer: Self-pay

## 2018-11-05 ENCOUNTER — Ambulatory Visit (HOSPITAL_COMMUNITY)
Admission: EM | Admit: 2018-11-05 | Discharge: 2018-11-05 | Disposition: A | Discharge status: Home / Self Care | Payer: Self-pay | Attending: Family Medicine | Admitting: Family Medicine

## 2018-11-05 DIAGNOSIS — Z79899 Other long term (current) drug therapy: Secondary | ICD-10-CM | POA: Insufficient documentation

## 2018-11-05 DIAGNOSIS — J111 Influenza due to unidentified influenza virus with other respiratory manifestations: Secondary | ICD-10-CM | POA: Insufficient documentation

## 2018-11-05 DIAGNOSIS — R69 Illness, unspecified: Secondary | ICD-10-CM

## 2018-11-05 DIAGNOSIS — F17210 Nicotine dependence, cigarettes, uncomplicated: Secondary | ICD-10-CM | POA: Insufficient documentation

## 2018-11-05 DIAGNOSIS — Z833 Family history of diabetes mellitus: Secondary | ICD-10-CM | POA: Insufficient documentation

## 2018-11-05 DIAGNOSIS — H9209 Otalgia, unspecified ear: Secondary | ICD-10-CM | POA: Insufficient documentation

## 2018-11-05 LAB — POCT RAPID STREP A: Streptococcus, Group A Screen (Direct): NEGATIVE

## 2018-11-05 MED ORDER — OSELTAMIVIR PHOSPHATE 75 MG PO CAPS: 75.0000 mg | ORAL_CAPSULE | Freq: Two times a day (BID) | ORAL | 0 refills | Status: DC

## 2018-11-05 NOTE — Discharge Instructions (Signed)
We are treating you with Tamiflu for flulike symptoms You can do ibuprofen or Tylenol for pain and fevers Over-the-counter Mucinex could help with cough. They also have nighttime Mucinex that could help with rest and cough Follow up as needed for continued or worsening symptoms

## 2018-11-05 NOTE — ED Triage Notes (Signed)
PT reports cough, sore throat, fever, and ear pain for 3 days.

## 2018-11-07 ENCOUNTER — Encounter (HOSPITAL_COMMUNITY): Payer: Self-pay | Admitting: Emergency Medicine

## 2018-11-07 ENCOUNTER — Emergency Department (HOSPITAL_COMMUNITY)
Admission: EM | Admit: 2018-11-07 | Discharge: 2018-11-07 | Disposition: A | Discharge status: Home / Self Care | Payer: Medicaid Other | Attending: Emergency Medicine | Admitting: Emergency Medicine

## 2018-11-07 ENCOUNTER — Emergency Department (HOSPITAL_COMMUNITY): Payer: Medicaid Other

## 2018-11-07 DIAGNOSIS — F17210 Nicotine dependence, cigarettes, uncomplicated: Secondary | ICD-10-CM | POA: Insufficient documentation

## 2018-11-07 DIAGNOSIS — J069 Acute upper respiratory infection, unspecified: Secondary | ICD-10-CM | POA: Insufficient documentation

## 2018-11-07 LAB — CULTURE, GROUP A STREP (THRC)

## 2018-11-07 MED ORDER — PROMETHAZINE HCL 25 MG PO TABS: 25.0000 mg | ORAL_TABLET | Freq: Three times a day (TID) | ORAL | 0 refills | Status: DC | PRN

## 2018-11-07 MED ORDER — ALBUTEROL SULFATE HFA 108 (90 BASE) MCG/ACT IN AERS
2.0000 | INHALATION_SPRAY | Freq: Once | RESPIRATORY_TRACT | Status: AC
  Administered 2018-11-07: 2 via RESPIRATORY_TRACT
  Filled 2018-11-07: qty 6.7

## 2018-11-07 MED ORDER — DEXAMETHASONE 4 MG PO TABS
10.0000 mg | ORAL_TABLET | Freq: Once | ORAL | Status: AC
  Administered 2018-11-07: 10 mg via ORAL
  Filled 2018-11-07: qty 3

## 2018-11-07 MED ORDER — IPRATROPIUM-ALBUTEROL 0.5-2.5 (3) MG/3ML IN SOLN
3.0000 mL | Freq: Once | RESPIRATORY_TRACT | Status: AC
  Administered 2018-11-07: 3 mL via RESPIRATORY_TRACT
  Filled 2018-11-07: qty 3

## 2018-11-07 NOTE — ED Notes (Signed)
Declined W/C at D/C and was escorted to lobby by RN. 

## 2018-11-07 NOTE — ED Triage Notes (Signed)
Pt states she has been having fevers, bodyaches, sore throat, SOB. Pt has asthma and feels like she cannot take a deep breath. Complains of wheezing as well.
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
## 2018-11-10 ENCOUNTER — Telehealth (HOSPITAL_COMMUNITY): Payer: Self-pay | Admitting: Emergency Medicine

## 2018-11-10 NOTE — Telephone Encounter (Signed)
Culture is positive for non group A Strep germ.  This is a finding of uncertain significance; not the typical 'strep throat' germ.  Pt states she is feeling better, no treatment needed.

## 2018-11-28 ENCOUNTER — Other Ambulatory Visit: Payer: Self-pay

## 2018-11-28 ENCOUNTER — Encounter (HOSPITAL_COMMUNITY): Payer: Self-pay

## 2018-11-28 ENCOUNTER — Emergency Department (HOSPITAL_COMMUNITY)
Admission: EM | Admit: 2018-11-28 | Discharge: 2018-11-28 | Payer: Medicaid Other | Attending: Emergency Medicine | Admitting: Emergency Medicine

## 2018-11-28 DIAGNOSIS — F17210 Nicotine dependence, cigarettes, uncomplicated: Secondary | ICD-10-CM | POA: Insufficient documentation

## 2018-11-28 DIAGNOSIS — J4540 Moderate persistent asthma, uncomplicated: Secondary | ICD-10-CM | POA: Insufficient documentation

## 2018-11-28 DIAGNOSIS — F419 Anxiety disorder, unspecified: Secondary | ICD-10-CM | POA: Insufficient documentation
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
## 2018-11-28 NOTE — ED Notes (Signed)
Pt unable to sign for discharge 
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
## 2018-11-28 NOTE — ED Notes (Signed)
Pt resting in bed, eyes closed, respirations present.  

## 2018-11-28 NOTE — ED Notes (Signed)
Pt states she was sleeping, but the doctor never came to her room. Pt was advised the doctor had been in the room. Pt requested to see the doctor. On the way to locate the doctor, pt yelled "Fuck it. Lets just go" to the [facility] officer, referring to jail.

## 2018-11-28 NOTE — ED Notes (Signed)
Pt is in police custody, and [employer] officers are at the bedside.

## 2018-11-28 NOTE — ED Notes (Signed)
Pt resting in bed, eyes closed, respirations present.
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
## 2019-01-01 ENCOUNTER — Encounter (HOSPITAL_COMMUNITY): Payer: Self-pay | Admitting: *Deleted

## 2019-01-01 ENCOUNTER — Emergency Department (HOSPITAL_COMMUNITY)
Admission: EM | Admit: 2019-01-01 | Discharge: 2019-01-01 | Disposition: A | Discharge status: Home / Self Care | Payer: Medicaid Other | Attending: Emergency Medicine | Admitting: Emergency Medicine

## 2019-01-01 ENCOUNTER — Other Ambulatory Visit: Payer: Self-pay

## 2019-01-01 DIAGNOSIS — J029 Acute pharyngitis, unspecified: Secondary | ICD-10-CM

## 2019-01-01 DIAGNOSIS — J45909 Unspecified asthma, uncomplicated: Secondary | ICD-10-CM | POA: Insufficient documentation

## 2019-01-01 DIAGNOSIS — F329 Major depressive disorder, single episode, unspecified: Secondary | ICD-10-CM | POA: Insufficient documentation

## 2019-01-01 DIAGNOSIS — F1020 Alcohol dependence, uncomplicated: Secondary | ICD-10-CM | POA: Insufficient documentation

## 2019-01-01 DIAGNOSIS — F1210 Cannabis abuse, uncomplicated: Secondary | ICD-10-CM | POA: Insufficient documentation

## 2019-01-01 DIAGNOSIS — F17210 Nicotine dependence, cigarettes, uncomplicated: Secondary | ICD-10-CM | POA: Insufficient documentation

## 2019-01-01 LAB — GROUP A STREP BY PCR: Group A Strep by PCR: NOT DETECTED

## 2019-01-01 MED ORDER — IBUPROFEN 600 MG PO TABS: 600.0000 mg | ORAL_TABLET | Freq: Four times a day (QID) | ORAL | 0 refills | Status: DC | PRN

## 2019-01-01 MED ORDER — PREDNISONE 10 MG (21) PO TBPK: ORAL_TABLET | ORAL | 0 refills | Status: DC

## 2019-01-01 MED ORDER — DEXAMETHASONE SODIUM PHOSPHATE 10 MG/ML IJ SOLN
10.0000 mg | Freq: Once | INTRAMUSCULAR | Status: AC
  Administered 2019-01-01: 10 mg via INTRAMUSCULAR
  Filled 2019-01-01: qty 1

## 2019-01-01 MED ORDER — KETOROLAC TROMETHAMINE 30 MG/ML IJ SOLN
30.0000 mg | Freq: Once | INTRAMUSCULAR | Status: AC
  Administered 2019-01-01: 30 mg via INTRAMUSCULAR
  Filled 2019-01-01: qty 1
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
## 2019-01-01 NOTE — ED Triage Notes (Signed)
Pt states she was seen by MD within last month who took a strep culture and said "it grew, but she wasn't concerned and didn't give me any meds".  Pt states throat is still sore, on and off.  Pt states still able to swallow fluids, but difficulty swallowing food.  States  Temp of 100 last night.

## 2019-01-01 NOTE — ED Notes (Signed)
Pt discharged from ED; instructions provided and scripts given; Pt encouraged to return to ED if symptoms worsen and to f/u with PCP; Pt verbalized understanding of all instructions 

## 2019-01-04 ENCOUNTER — Emergency Department (HOSPITAL_COMMUNITY)
Admission: EM | Admit: 2019-01-04 | Discharge: 2019-01-04 | Disposition: A | Discharge status: Home / Self Care | Payer: Medicaid Other | Attending: Emergency Medicine | Admitting: Emergency Medicine

## 2019-01-04 ENCOUNTER — Encounter (HOSPITAL_COMMUNITY): Payer: Self-pay | Admitting: Emergency Medicine

## 2019-01-04 ENCOUNTER — Other Ambulatory Visit: Payer: Self-pay

## 2019-01-04 ENCOUNTER — Emergency Department (HOSPITAL_COMMUNITY): Payer: Medicaid Other

## 2019-01-04 DIAGNOSIS — F17210 Nicotine dependence, cigarettes, uncomplicated: Secondary | ICD-10-CM | POA: Insufficient documentation

## 2019-01-04 DIAGNOSIS — Y939 Activity, unspecified: Secondary | ICD-10-CM | POA: Insufficient documentation

## 2019-01-04 DIAGNOSIS — Y999 Unspecified external cause status: Secondary | ICD-10-CM | POA: Insufficient documentation

## 2019-01-04 DIAGNOSIS — W51XXXA Accidental striking against or bumped into by another person, initial encounter: Secondary | ICD-10-CM | POA: Insufficient documentation

## 2019-01-04 DIAGNOSIS — Z79899 Other long term (current) drug therapy: Secondary | ICD-10-CM | POA: Insufficient documentation

## 2019-01-04 DIAGNOSIS — S022XXA Fracture of nasal bones, initial encounter for closed fracture: Secondary | ICD-10-CM | POA: Insufficient documentation

## 2019-01-04 DIAGNOSIS — J45909 Unspecified asthma, uncomplicated: Secondary | ICD-10-CM | POA: Insufficient documentation

## 2019-01-04 DIAGNOSIS — Y929 Unspecified place or not applicable: Secondary | ICD-10-CM | POA: Insufficient documentation

## 2019-01-04 DIAGNOSIS — Z23 Encounter for immunization: Secondary | ICD-10-CM | POA: Insufficient documentation

## 2019-01-04 LAB — I-STAT BETA HCG BLOOD, ED (MC, WL, AP ONLY): I-stat hCG, quantitative: <5 m[IU]/mL (ref <5)

## 2019-01-04 MED ORDER — DICLOFENAC SODIUM ER 100 MG PO TB24: 100.0000 mg | ORAL_TABLET | Freq: Every day | ORAL | 0 refills | Status: DC

## 2019-01-04 MED ORDER — OXYMETAZOLINE HCL 0.05 % NA SOLN
2.0000 | Freq: Once | NASAL | Status: AC
  Administered 2019-01-04: 2 via NASAL
  Filled 2019-01-04: qty 30

## 2019-01-04 MED ORDER — TETANUS-DIPHTH-ACELL PERTUSSIS 5-2.5-18.5 LF-MCG/0.5 IM SUSP
0.5000 mL | Freq: Once | INTRAMUSCULAR | Status: AC
  Administered 2019-01-04: 0.5 mL via INTRAMUSCULAR
  Filled 2019-01-04: qty 0.5

## 2019-01-04 MED ORDER — KETOROLAC TROMETHAMINE 30 MG/ML IJ SOLN
30.0000 mg | Freq: Once | INTRAMUSCULAR | Status: AC
  Administered 2019-01-04: 30 mg via INTRAMUSCULAR
  Filled 2019-01-04: qty 1
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
## 2019-01-05 ENCOUNTER — Ambulatory Visit (HOSPITAL_COMMUNITY)
Admission: EM | Admit: 2019-01-05 | Discharge: 2019-01-05 | Disposition: A | Discharge status: Home / Self Care | Payer: Medicaid Other | Attending: Family Medicine | Admitting: Family Medicine

## 2019-01-05 ENCOUNTER — Other Ambulatory Visit: Payer: Self-pay

## 2019-01-05 ENCOUNTER — Emergency Department (HOSPITAL_COMMUNITY)
Admission: EM | Admit: 2019-01-05 | Discharge: 2019-01-05 | Disposition: A | Discharge status: Home / Self Care | Payer: Medicaid Other | Attending: Emergency Medicine | Admitting: Emergency Medicine

## 2019-01-05 ENCOUNTER — Encounter (HOSPITAL_COMMUNITY): Payer: Self-pay | Admitting: Emergency Medicine

## 2019-01-05 DIAGNOSIS — S022XXD Fracture of nasal bones, subsequent encounter for fracture with routine healing: Secondary | ICD-10-CM

## 2019-01-05 DIAGNOSIS — Z5321 Procedure and treatment not carried out due to patient leaving prior to being seen by health care provider: Secondary | ICD-10-CM | POA: Insufficient documentation

## 2019-01-05 DIAGNOSIS — G8911 Acute pain due to trauma: Secondary | ICD-10-CM | POA: Insufficient documentation

## 2019-01-05 MED ORDER — HYDROCODONE-ACETAMINOPHEN 7.5-325 MG PO TABS: 1.0000 | ORAL_TABLET | Freq: Four times a day (QID) | ORAL | 0 refills | Status: DC | PRN

## 2019-01-05 NOTE — ED Notes (Signed)
Pt LAMA, Pt stated that wait time was too long

## 2019-01-05 NOTE — ED Triage Notes (Signed)
The patient presented to the [facility] with a complaint of pain control. The patient reported that she was evaluated at the [facility] for a fractured nose yesterday and the pain medication is not sufficient.

## 2019-01-05 NOTE — ED Triage Notes (Signed)
Pt was seen yesterday for nose fx and states that she is in pain and wants something different for pain.

## 2019-01-05 NOTE — Discharge Instructions (Signed)
Continue diclofenac Continue using ice for 20 minutes every couple of hours Limit your physical activity Take the pain medicine when your pain is severe.  You may not drive or operate machinery while on pain medicine.  Please take with food. Follow-up with an ear nose and throat doctor for your nasal fracture
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
## 2019-01-20 ENCOUNTER — Telehealth: Payer: Self-pay | Admitting: Family Medicine

## 2019-01-20 DIAGNOSIS — S022XXD Fracture of nasal bones, subsequent encounter for fracture with routine healing: Secondary | ICD-10-CM

## 2019-01-20 NOTE — Addendum Note (Signed)
Addended by: Tillman Sers on: 01/20/2019 07:02 PM   Modules accepted: Orders

## 2019-01-20 NOTE — Telephone Encounter (Signed)
  Referral ordered to ENT

## 2019-01-20 NOTE — Telephone Encounter (Signed)
Pt was seen in the ER and was told to see a ENT doctor after her swelling went down. They think she has a broken nose. Can we get her a referral so she can be seen. jw

## 2019-04-24 ENCOUNTER — Other Ambulatory Visit: Payer: Self-pay

## 2019-04-24 ENCOUNTER — Emergency Department (HOSPITAL_COMMUNITY)
Admission: EM | Admit: 2019-04-24 | Discharge: 2019-04-24 | Disposition: A | Discharge status: Home / Self Care | Payer: Medicaid Other | Attending: Emergency Medicine | Admitting: Emergency Medicine

## 2019-04-24 ENCOUNTER — Encounter (HOSPITAL_COMMUNITY): Payer: Self-pay | Admitting: Emergency Medicine

## 2019-04-24 DIAGNOSIS — R109 Unspecified abdominal pain: Secondary | ICD-10-CM | POA: Insufficient documentation

## 2019-04-24 DIAGNOSIS — N939 Abnormal uterine and vaginal bleeding, unspecified: Secondary | ICD-10-CM | POA: Insufficient documentation

## 2019-04-24 DIAGNOSIS — R0789 Other chest pain: Secondary | ICD-10-CM | POA: Insufficient documentation

## 2019-04-24 DIAGNOSIS — F17210 Nicotine dependence, cigarettes, uncomplicated: Secondary | ICD-10-CM | POA: Insufficient documentation

## 2019-04-24 DIAGNOSIS — H538 Other visual disturbances: Secondary | ICD-10-CM

## 2019-04-24 LAB — URINALYSIS, ROUTINE W REFLEX MICROSCOPIC
Bilirubin Urine: NEGATIVE
Glucose, UA: NEGATIVE mg/dL
Ketones, ur: NEGATIVE mg/dL
Leukocytes,Ua: NEGATIVE
Nitrite: NEGATIVE
Protein, ur: NEGATIVE mg/dL
RBC / HPF: >50 RBC/hpf — ABNORMAL HIGH (ref 0–5)
Specific Gravity, Urine: 1.02 (ref 1.005–1.030)
pH: 6 (ref 5.0–8.0)

## 2019-04-24 LAB — CBC WITH DIFFERENTIAL/PLATELET
Abs Immature Granulocytes: 0.03 10*3/uL (ref 0.00–0.07)
Basophils Absolute: 0 10*3/uL (ref 0.0–0.1)
Basophils Relative: 0 %
Eosinophils Absolute: 0 10*3/uL (ref 0.0–0.5)
Eosinophils Relative: 0 %
HCT: 37.3 % (ref 36.0–46.0)
Hemoglobin: 12.1 g/dL (ref 12.0–15.0)
Immature Granulocytes: 0 %
Lymphocytes Relative: 7 %
Lymphs Abs: 0.6 10*3/uL — ABNORMAL LOW (ref 0.7–4.0)
MCH: 27.5 pg (ref 26.0–34.0)
MCHC: 32.4 g/dL (ref 30.0–36.0)
MCV: 84.8 fL (ref 80.0–100.0)
Monocytes Absolute: 0.4 10*3/uL (ref 0.1–1.0)
Monocytes Relative: 5 %
Neutro Abs: 7.9 10*3/uL — ABNORMAL HIGH (ref 1.7–7.7)
Neutrophils Relative %: 88 %
Platelets: 265 10*3/uL (ref 150–400)
RBC: 4.4 MIL/uL (ref 3.87–5.11)
RDW: 13.2 % (ref 11.5–15.5)
WBC: 8.9 10*3/uL (ref 4.0–10.5)
nRBC: 0 % (ref 0.0–0.2)

## 2019-04-24 LAB — BASIC METABOLIC PANEL
Anion gap: 10 (ref 5–15)
BUN: 10 mg/dL (ref 6–20)
CO2: 21 mmol/L — ABNORMAL LOW (ref 22–32)
Calcium: 9.2 mg/dL (ref 8.9–10.3)
Chloride: 108 mmol/L (ref 98–111)
Creatinine, Ser: 0.84 mg/dL (ref 0.44–1.00)
GFR calc Af Amer: >60 mL/min (ref >60)
GFR calc non Af Amer: >60 mL/min (ref >60)
Glucose, Bld: 107 mg/dL — ABNORMAL HIGH (ref 70–99)
Potassium: 4.1 mmol/L (ref 3.5–5.1)
Sodium: 139 mmol/L (ref 135–145)

## 2019-04-24 LAB — ETHANOL: Alcohol, Ethyl (B): 19 mg/dL — ABNORMAL HIGH (ref <10)

## 2019-04-24 LAB — I-STAT BETA HCG BLOOD, ED (MC, WL, AP ONLY): I-stat hCG, quantitative: <5 m[IU]/mL (ref <5)

## 2019-04-24 NOTE — ED Triage Notes (Signed)
Patient BIB [facility] with right sided vision changes started at 2300, alcohol on board, no weakness, slightly slurred speech. Hx of anemia, and gastric ulcers . Patient stated that she had blood come out while going to the bathroom, however she is unsure where the blood is coming from. 139/79 hr 100 rr 16 97% RA 98.1 T 128 CBG. 18G L AC.
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
## 2019-04-25 ENCOUNTER — Inpatient Hospital Stay (HOSPITAL_COMMUNITY)
Admission: AD | Admit: 2019-04-25 | Discharge: 2019-04-25 | Disposition: A | Discharge status: Home / Self Care | Payer: Medicaid Other | Attending: Obstetrics & Gynecology | Admitting: Obstetrics & Gynecology

## 2019-04-25 ENCOUNTER — Other Ambulatory Visit: Payer: Self-pay

## 2019-04-25 DIAGNOSIS — F4310 Post-traumatic stress disorder, unspecified: Secondary | ICD-10-CM | POA: Insufficient documentation

## 2019-04-25 DIAGNOSIS — N939 Abnormal uterine and vaginal bleeding, unspecified: Secondary | ICD-10-CM | POA: Insufficient documentation

## 2019-04-25 DIAGNOSIS — R109 Unspecified abdominal pain: Secondary | ICD-10-CM | POA: Insufficient documentation

## 2019-04-25 DIAGNOSIS — R1115 Cyclical vomiting syndrome unrelated to migraine: Secondary | ICD-10-CM | POA: Insufficient documentation

## 2019-04-25 DIAGNOSIS — F17210 Nicotine dependence, cigarettes, uncomplicated: Secondary | ICD-10-CM | POA: Insufficient documentation

## 2019-04-25 DIAGNOSIS — J4540 Moderate persistent asthma, uncomplicated: Secondary | ICD-10-CM | POA: Insufficient documentation
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
## 2019-04-25 NOTE — MAU Note (Signed)
Earlie Server NP talked with pt in triage, pt was discharged by NP.

## 2019-04-25 NOTE — MAU Provider Note (Signed)
History     CSN: [id number]  Arrival date and time: 04/25/19 1321   Seen by provider at 1:45 pm    Chief Complaint  Patient presents with  . Abdominal Pain  . Vaginal Bleeding   HPI [facility] 23 y.o. Comes to MAU with heavy bleeding (wearing only tampons - has not used pads).  Was seen in the ER yesterday and BHCG was less than 5 = negative for pregnancy.  ER recommended and appointment for GYN followup.  Client reports she was told to come to "[facility]" for further evaluation.  Medical screening done.    OB History    Gravida  0   Para  0   Term  0   Preterm  0   AB  0   Living  0     SAB  0   TAB  0   Ectopic  0   Multiple  0   Live Births  0           Past Medical History:  Diagnosis Date  . Abdominal pain   . Asthma, moderate persistent   . Cyclic vomiting syndrome   . Depression   . Environmental allergies   . Marijuana smoker   . Ovarian cyst   . Pes planus (flat feet)   . PTSD (post-traumatic stress disorder)     Past Surgical History:  Procedure Laterality Date  . NOSE SURGERY      Family History  Problem Relation Age of Onset  . Obesity Mother   . Diabetes Mother   . Hypertension Mother     Social History   Tobacco Use  . Smoking status: Light Tobacco Smoker    Packs/day: 0.10    Types: Cigarettes    Last attempt to quit: 03/07/2014    Years since quitting: 5.1  . Smokeless tobacco: Never Used  . Tobacco comment: smokes marajuana at times  Substance Use Topics  . Alcohol use: Yes    Comment: ocassionally  . Drug use: No    Allergies:  Allergies  Allergen Reactions  . Shellfish-Derived Products Nausea Only and Other (See Comments)    Positive allergy test    Medications Prior to Admission  Medication Sig Dispense Refill Last Dose  . Diclofenac Sodium CR 100 MG 24 hr tablet Take 1 tablet (100 mg total) by mouth daily. 10 tablet 0   . HYDROcodone-acetaminophen (NORCO) 7.5-325 MG tablet Take 1  tablet by mouth every 6 (six) hours as needed for moderate pain. 10 tablet 0     Review of Systems  Constitutional: Negative for fever.       Talks easily and makes eye contact.  Able to walk without assistance.  Holding a fabric pad and sitting in the recliner chair in MAU.  No blood on the recliner chair when she stands up.  Respiratory: Negative for cough and shortness of breath.   Gastrointestinal: Positive for abdominal pain.  Genitourinary: Positive for vaginal bleeding. Negative for dysuria and vaginal discharge.  Neurological: Negative for weakness.   Physical Exam   Blood pressure 129/68, pulse 73, temperature 98.2 F (36.8 C), temperature source Oral, resp. rate 16, height 5\' 9"  (1.753 m), weight 62 kg, last menstrual period 04/17/2019, SpO2 100 %.  Physical Exam GENERAL: Well-developed, well-nourished female in no acute distress.  HEENT: Normocephalic, atraumatic.  LUNGS: Effort normal  HEART: Regular rate  SKIN: Warm, dry and without erythema  PSYCH: Normal mood and affect   MAU Course  Procedures  MDM Client is physically stable at this time.  Advised that [facility] is a maternity assessment unit and from her lab tests yesterday, she is not pregnant.  Agreed that heavier and longer vaginal bleeding is bothersome and she may need additional evaluation, but here today is not where that evaluation would happen.  Assessment and Plan  Abnormal bleeding - extended and somewhat heavier than she usually has as she is bleeding through her tampons.  Plan Advised over the counter ibuprofen 600 mg PO every 6 hours with food for 3 days - for some women this is helpful in decreasing the amount of vaginal bleeding. Advised wearing a pad in addition to tampons to accommodate the heavier bleeding that she is having. Gave her a list of providers where she could be seen.  Advised calling to make [facility] for heavy bleeding.  The office is open tomorrow,  not today. Advised that the above measures might help her today and give her time to have an appointment. Also on the list is the ER but since she was seen there yesterday and the note states they  were advising an appointment at an office, she might want to call tomorrow for an appointment.    L  04/25/2019, 1:50 PM

## 2019-06-29 ENCOUNTER — Ambulatory Visit (INDEPENDENT_AMBULATORY_CARE_PROVIDER_SITE_OTHER): Payer: Self-pay

## 2019-06-29 ENCOUNTER — Encounter (HOSPITAL_COMMUNITY): Payer: Self-pay | Admitting: Emergency Medicine

## 2019-06-29 ENCOUNTER — Ambulatory Visit (HOSPITAL_COMMUNITY)
Admission: EM | Admit: 2019-06-29 | Discharge: 2019-06-29 | Disposition: A | Discharge status: Home / Self Care | Payer: Self-pay | Attending: Family Medicine | Admitting: Family Medicine

## 2019-06-29 DIAGNOSIS — S6991XA Unspecified injury of right wrist, hand and finger(s), initial encounter: Secondary | ICD-10-CM

## 2019-06-29 DIAGNOSIS — S60221A Contusion of right hand, initial encounter: Secondary | ICD-10-CM

## 2019-06-29 DIAGNOSIS — W2209XA Striking against other stationary object, initial encounter: Secondary | ICD-10-CM

## 2019-06-29 MED ORDER — IBUPROFEN 800 MG PO TABS: 800.0000 mg | ORAL_TABLET | Freq: Three times a day (TID) | ORAL | 0 refills | Status: DC
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
## 2019-07-08 ENCOUNTER — Emergency Department (HOSPITAL_COMMUNITY)
Admission: EM | Admit: 2019-07-08 | Discharge: 2019-07-08 | Disposition: A | Discharge status: Home / Self Care | Payer: Medicaid Other | Attending: Emergency Medicine | Admitting: Emergency Medicine

## 2019-07-08 ENCOUNTER — Encounter (HOSPITAL_COMMUNITY): Payer: Self-pay | Admitting: Emergency Medicine

## 2019-07-08 ENCOUNTER — Other Ambulatory Visit: Payer: Self-pay

## 2019-07-08 DIAGNOSIS — Y999 Unspecified external cause status: Secondary | ICD-10-CM | POA: Insufficient documentation

## 2019-07-08 DIAGNOSIS — Y929 Unspecified place or not applicable: Secondary | ICD-10-CM | POA: Insufficient documentation

## 2019-07-08 DIAGNOSIS — S01511A Laceration without foreign body of lip, initial encounter: Secondary | ICD-10-CM | POA: Insufficient documentation

## 2019-07-08 DIAGNOSIS — F17210 Nicotine dependence, cigarettes, uncomplicated: Secondary | ICD-10-CM | POA: Insufficient documentation

## 2019-07-08 DIAGNOSIS — Y939 Activity, unspecified: Secondary | ICD-10-CM | POA: Insufficient documentation

## 2019-07-08 MED ORDER — IBUPROFEN 400 MG PO TABS
600.0000 mg | ORAL_TABLET | Freq: Once | ORAL | Status: AC
  Administered 2019-07-08: 600 mg via ORAL
  Filled 2019-07-08: qty 1
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
## 2019-07-08 NOTE — Discharge Instructions (Signed)
It was my pleasure taking care of you today!   Tylenol and/or ibuprofen as needed for pain.  Ice helps a great deal with pain / swelling as well.   Rinse mouth out with salt water gargles after all meals.   Follow up with your primary care doctor if symptoms are not improving.   Return to ER for new or worsening symptoms, any additional concerns.
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
## 2019-08-05 ENCOUNTER — Other Ambulatory Visit: Payer: Self-pay

## 2019-08-05 ENCOUNTER — Telehealth (INDEPENDENT_AMBULATORY_CARE_PROVIDER_SITE_OTHER): Payer: Self-pay | Admitting: Family Medicine

## 2019-08-05 DIAGNOSIS — Z20822 Contact with and (suspected) exposure to covid-19: Secondary | ICD-10-CM

## 2019-08-05 DIAGNOSIS — J069 Acute upper respiratory infection, unspecified: Secondary | ICD-10-CM | POA: Insufficient documentation

## 2019-08-05 MED ORDER — PREDNISONE 20 MG PO TABS: 40.0000 mg | ORAL_TABLET | Freq: Every day | ORAL | 0 refills | Status: AC

## 2019-08-05 MED ORDER — ACETAMINOPHEN 500 MG PO TABS: 500.0000 mg | ORAL_TABLET | Freq: Four times a day (QID) | ORAL | 0 refills | Status: DC | PRN

## 2019-08-05 MED ORDER — FLUTICASONE PROPIONATE 50 MCG/ACT NA SUSP: 1.0000 | Freq: Every day | NASAL | 0 refills | Status: DC

## 2019-08-05 MED ORDER — CETIRIZINE HCL 10 MG PO TABS: 10.0000 mg | ORAL_TABLET | Freq: Every day | ORAL | 0 refills | Status: DC

## 2019-08-05 MED ORDER — IBUPROFEN 200 MG PO TABS: 200.0000 mg | ORAL_TABLET | Freq: Four times a day (QID) | ORAL | 0 refills | Status: DC | PRN

## 2019-08-05 NOTE — Assessment & Plan Note (Signed)
Patient with upper respiratory symptoms most likely of allergic versus viral etiology.  Has had contact with 2 sick individuals, her wife as well as COVID positive individual from work.  Patient Centor criteria is 1, meaning 5-10% chance of strep infection not requiring treatment. -Prescribing cetirizine 10 mg tablet every morning -Tylenol 500 mg with Advil 200 mg every 6 hours as needed for fever, body aches -Flonase every morning -Benadryl 1 tablet every evening -We will prescribe prednisone burst 40 mg daily x5 days -Sending patient for coronavirus testing due to close contact -Encouraged hydration with sports drinks, warm tea, and broths -Strict return precautions given including worsening symptoms or shortness of breath.  If patient symptoms do not improve within 1 week, or improve but then return, would recommend starting an antibiotic.
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
## 2019-08-06 LAB — NOVEL CORONAVIRUS, NAA: SARS-CoV-2, NAA: NOT DETECTED

## 2019-09-20 ENCOUNTER — Ambulatory Visit (HOSPITAL_COMMUNITY)
Admission: EM | Admit: 2019-09-20 | Discharge: 2019-09-20 | Disposition: A | Discharge status: Home / Self Care | Payer: Medicaid Other | Attending: Internal Medicine | Admitting: Internal Medicine

## 2019-09-20 ENCOUNTER — Other Ambulatory Visit: Payer: Self-pay

## 2019-09-20 ENCOUNTER — Encounter (HOSPITAL_COMMUNITY): Payer: Self-pay

## 2019-09-20 DIAGNOSIS — F17210 Nicotine dependence, cigarettes, uncomplicated: Secondary | ICD-10-CM | POA: Insufficient documentation

## 2019-09-20 DIAGNOSIS — Z915 Personal history of self-harm: Secondary | ICD-10-CM | POA: Insufficient documentation

## 2019-09-20 DIAGNOSIS — Z20828 Contact with and (suspected) exposure to other viral communicable diseases: Secondary | ICD-10-CM | POA: Insufficient documentation

## 2019-09-20 DIAGNOSIS — Z79899 Other long term (current) drug therapy: Secondary | ICD-10-CM | POA: Insufficient documentation

## 2019-09-20 DIAGNOSIS — Z91013 Allergy to seafood: Secondary | ICD-10-CM | POA: Insufficient documentation

## 2019-09-20 DIAGNOSIS — R112 Nausea with vomiting, unspecified: Secondary | ICD-10-CM | POA: Insufficient documentation

## 2019-09-20 DIAGNOSIS — Z833 Family history of diabetes mellitus: Secondary | ICD-10-CM | POA: Insufficient documentation

## 2019-09-20 DIAGNOSIS — F1211 Cannabis abuse, in remission: Secondary | ICD-10-CM | POA: Insufficient documentation

## 2019-09-20 LAB — POCT URINALYSIS DIP (DEVICE)
Bilirubin Urine: NEGATIVE
Glucose, UA: NEGATIVE mg/dL
Hgb urine dipstick: NEGATIVE
Leukocytes,Ua: NEGATIVE
Nitrite: NEGATIVE
Protein, ur: NEGATIVE mg/dL
Specific Gravity, Urine: 1.025 (ref 1.005–1.030)
Urobilinogen, UA: 1 mg/dL (ref 0.0–1.0)
pH: 7 (ref 5.0–8.0)

## 2019-09-20 LAB — GLUCOSE, CAPILLARY: Glucose-Capillary: 65 mg/dL — ABNORMAL LOW (ref 70–99)

## 2019-09-20 MED ORDER — METOCLOPRAMIDE HCL 5 MG/ML IJ SOLN
10.0000 mg | Freq: Once | INTRAMUSCULAR | Status: AC
  Administered 2019-09-20: 20:00:00 10 mg via INTRAMUSCULAR

## 2019-09-20 MED ORDER — METOCLOPRAMIDE HCL 10 MG PO TABS: 10.0000 mg | ORAL_TABLET | Freq: Three times a day (TID) | ORAL | 0 refills | Status: DC

## 2019-09-20 MED ORDER — METOCLOPRAMIDE HCL 5 MG/ML IJ SOLN
INTRAMUSCULAR | Status: AC
  Filled 2019-09-20: qty 2

## 2019-09-20 MED ORDER — ONDANSETRON 4 MG PO TBDP: 4.0000 mg | ORAL_TABLET | Freq: Three times a day (TID) | ORAL | 0 refills | Status: DC | PRN

## 2019-09-20 NOTE — ED Triage Notes (Signed)
Pt states she is vominting x 4-5 days, she is not able to keep any fodo down. Pt reports has cyclic vomiting syndrome. Pt feel weak.

## 2019-09-20 NOTE — Discharge Instructions (Addendum)
We gave you reglan today Continue to try using zofran - dissolves in mouth or reglan as needed for nausea Focus on clear fluids initially, then try some gatorade/pedialyte As fluids stay down add in blander foods- rice, toast, crackers, soups  If vomiting persisting or developing increased lightheadedness, dizziness, abdominal pain follow up in emergency room
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
## 2019-09-22 LAB — NOVEL CORONAVIRUS, NAA (HOSP ORDER, SEND-OUT TO REF LAB; TAT 18-24 HRS): SARS-CoV-2, NAA: NOT DETECTED

## 2020-02-19 ENCOUNTER — Ambulatory Visit: Payer: Medicaid Other | Attending: Internal Medicine

## 2020-02-19 DIAGNOSIS — Z23 Encounter for immunization: Secondary | ICD-10-CM

## 2020-02-19 NOTE — Progress Notes (Signed)
   Covid-19 Vaccination Clinic  Name:  Jenah Vanasten    MRN: [id number] DOB: 08-19-1996  02/19/2020  Ms. Huebsch was observed post Covid-19 immunization for 15 minutes without incident. She was provided with Vaccine Information Sheet and instruction to access the V-Safe system.   Ms. Porada was instructed to call 911 with any severe reactions post vaccine: Marland Kitchen Difficulty breathing  . Swelling of face and throat  . A fast heartbeat  . A bad rash all over body  . Dizziness and weakness   Immunizations Administered    Name Date Dose VIS Date Route   Pfizer COVID-19 Vaccine 02/19/2020  8:53 AM 0.3 mL 10/15/2019 Intramuscular   Manufacturer: [employer], [employer]   Lot: [id number]   NDC: [address]-3

## 2020-03-13 ENCOUNTER — Ambulatory Visit: Payer: Medicaid Other | Attending: Internal Medicine

## 2020-03-13 DIAGNOSIS — Z23 Encounter for immunization: Secondary | ICD-10-CM

## 2020-03-13 NOTE — Progress Notes (Signed)
   Covid-19 Vaccination Clinic  Name:  Mia Johnson    MRN: [id number] DOB: 1996/01/31  03/13/2020  Mia Johnson was observed post Covid-19 immunization for 15 minutes without incident. She was provided with Vaccine Information Sheet and instruction to access the V-Safe system.   Mia Johnson was instructed to call 911 with any severe reactions post vaccine: Marland Kitchen Difficulty breathing  . Swelling of face and throat  . A fast heartbeat  . A bad rash all over body  . Dizziness and weakness   Immunizations Administered    Name Date Dose VIS Date Route   Pfizer COVID-19 Vaccine 03/13/2020 10:12 AM 0.3 mL 12/29/2018 Intramuscular   Manufacturer: [employer], [employer]   Lot: [id number]   NDC: [address]-1

## 2020-05-28 ENCOUNTER — Emergency Department (HOSPITAL_COMMUNITY)
Admission: EM | Admit: 2020-05-28 | Discharge: 2020-05-28 | Disposition: A | Discharge status: Home / Self Care | Payer: BC Managed Care – PPO | Attending: Emergency Medicine | Admitting: Emergency Medicine

## 2020-05-28 ENCOUNTER — Other Ambulatory Visit: Payer: Self-pay

## 2020-05-28 ENCOUNTER — Encounter (HOSPITAL_COMMUNITY): Payer: Self-pay | Admitting: Emergency Medicine

## 2020-05-28 DIAGNOSIS — W1789XA Other fall from one level to another, initial encounter: Secondary | ICD-10-CM | POA: Insufficient documentation

## 2020-05-28 DIAGNOSIS — F4324 Adjustment disorder with disturbance of conduct: Secondary | ICD-10-CM

## 2020-05-28 DIAGNOSIS — Y9389 Activity, other specified: Secondary | ICD-10-CM | POA: Insufficient documentation

## 2020-05-28 DIAGNOSIS — F10129 Alcohol abuse with intoxication, unspecified: Secondary | ICD-10-CM | POA: Diagnosis not present

## 2020-05-28 DIAGNOSIS — Y9289 Other specified places as the place of occurrence of the external cause: Secondary | ICD-10-CM | POA: Diagnosis not present

## 2020-05-28 DIAGNOSIS — F17210 Nicotine dependence, cigarettes, uncomplicated: Secondary | ICD-10-CM | POA: Diagnosis not present

## 2020-05-28 DIAGNOSIS — J4540 Moderate persistent asthma, uncomplicated: Secondary | ICD-10-CM | POA: Insufficient documentation

## 2020-05-28 DIAGNOSIS — Y999 Unspecified external cause status: Secondary | ICD-10-CM | POA: Insufficient documentation

## 2020-05-28 DIAGNOSIS — R45851 Suicidal ideations: Secondary | ICD-10-CM | POA: Insufficient documentation

## 2020-05-28 DIAGNOSIS — F10929 Alcohol use, unspecified with intoxication, unspecified: Secondary | ICD-10-CM | POA: Diagnosis not present

## 2020-05-28 DIAGNOSIS — T1491XA Suicide attempt, initial encounter: Secondary | ICD-10-CM | POA: Diagnosis not present

## 2020-05-28 LAB — CBC WITH DIFFERENTIAL/PLATELET
Abs Immature Granulocytes: 0.03 10*3/uL (ref 0.00–0.07)
Basophils Absolute: 0 10*3/uL (ref 0.0–0.1)
Basophils Relative: 1 %
Eosinophils Absolute: 0 10*3/uL (ref 0.0–0.5)
Eosinophils Relative: 0 %
HCT: 38 % (ref 36.0–46.0)
Hemoglobin: 12.8 g/dL (ref 12.0–15.0)
Immature Granulocytes: 1 %
Lymphocytes Relative: 27 %
Lymphs Abs: 1.3 10*3/uL (ref 0.7–4.0)
MCH: 27.9 pg (ref 26.0–34.0)
MCHC: 33.7 g/dL (ref 30.0–36.0)
MCV: 83 fL (ref 80.0–100.0)
Monocytes Absolute: 0.4 10*3/uL (ref 0.1–1.0)
Monocytes Relative: 8 %
Neutro Abs: 2.9 10*3/uL (ref 1.7–7.7)
Neutrophils Relative %: 63 %
Platelets: 323 10*3/uL (ref 150–400)
RBC: 4.58 MIL/uL (ref 3.87–5.11)
RDW: 13.4 % (ref 11.5–15.5)
WBC: 4.6 10*3/uL (ref 4.0–10.5)
nRBC: 0 % (ref 0.0–0.2)

## 2020-05-28 LAB — BASIC METABOLIC PANEL
Anion gap: 12 (ref 5–15)
BUN: 10 mg/dL (ref 6–20)
CO2: 24 mmol/L (ref 22–32)
Calcium: 9.1 mg/dL (ref 8.9–10.3)
Chloride: 108 mmol/L (ref 98–111)
Creatinine, Ser: 0.85 mg/dL (ref 0.44–1.00)
GFR calc Af Amer: >60 mL/min (ref >60)
GFR calc non Af Amer: >60 mL/min (ref >60)
Glucose, Bld: 111 mg/dL — ABNORMAL HIGH (ref 70–99)
Potassium: 3.9 mmol/L (ref 3.5–5.1)
Sodium: 144 mmol/L (ref 135–145)

## 2020-05-28 LAB — RAPID URINE DRUG SCREEN, HOSP PERFORMED
Amphetamines: NOT DETECTED
Barbiturates: NOT DETECTED
Benzodiazepines: NOT DETECTED
Cocaine: NOT DETECTED
Opiates: NOT DETECTED
Tetrahydrocannabinol: NOT DETECTED

## 2020-05-28 LAB — ETHANOL: Alcohol, Ethyl (B): 227 mg/dL — ABNORMAL HIGH (ref <10)

## 2020-05-28 LAB — PREGNANCY, URINE: Preg Test, Ur: NEGATIVE

## 2020-05-28 MED ORDER — ONDANSETRON HCL 4 MG PO TABS: 4.0000 mg | ORAL_TABLET | Freq: Three times a day (TID) | ORAL | Status: DC | PRN

## 2020-05-28 MED ORDER — ALUM & MAG HYDROXIDE-SIMETH 200-200-20 MG/5ML PO SUSP: 30.0000 mL | Freq: Four times a day (QID) | ORAL | Status: DC | PRN

## 2020-05-28 MED ORDER — IBUPROFEN 200 MG PO TABS: 600.0000 mg | ORAL_TABLET | Freq: Three times a day (TID) | ORAL | Status: DC | PRN

## 2020-05-28 NOTE — BH Assessment (Signed)
Per [name], NP, recommended over night observation. Pt will be reassessed by psych in the morning.    Dolores Frame, MSW, LCSW-A Triage Specialist, [facility]/ [facility] [phone]
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
## 2020-05-28 NOTE — ED Provider Notes (Signed)
[facility]-EMERGENCY DEPT Provider Note: [name], MD, FACEP  CSN: [id number] MRN: [id number] ARRIVAL: 05/28/20 at 0108 ROOM: [facility]4/[facility]   CHIEF COMPLAINT  Suicidal   HISTORY OF PRESENT ILLNESS  05/28/20 1:41 AM Mia Johnson is a 24 y.o. female who was brought in by police.  The patient sister called the police due to a domestic argument between the patient and the sister.  The sister alleged that the patient was intoxicated and causing a disturbance.  When the police went in to discuss the matter with the patient the patient attempted to jump off a third floor balcony and would have has succeeded had the police not grabbed her.  The patient made several statements about suicide, such as "I am going to take as many pills as I can when I get out of here".  The patient herself admits that she has been under significant stress since the death of her father on Father's Day last year.  She states that this is caused a continuing rift between her sister and her.  She states she tried to jump off the balcony because she would rather die than go to jail (the police states she was not facing charges and it is unclear why she believes she would be sent to jail).  She admits to having depression but states she is not depressed (sic).  She admits to drinking alcohol yesterday evening and this morning for the first time since her father died.  She denies any coingestants.  She states that whenever she drinks alcohol her sister believes that she is the cause of any problems that are going on.  (The patient tends to repeat herself over and over again with the same themes.  The police that accompany her report similar behavior with them.)  Physically the patient's only complaints are some left ear discomfort which feels like she has fluid in her ears which she attributes to swimming in the ocean sometime back.  She is also having some mild throat discomfort.   Past Medical History:  Diagnosis Date    Abdominal pain    Asthma, moderate persistent    Cyclic vomiting syndrome    Depression    Environmental allergies    Marijuana smoker    Ovarian cyst    Pes planus (flat feet)    PTSD (post-traumatic stress disorder)     Past Surgical History:  Procedure Laterality Date   NOSE SURGERY      Family History  Problem Relation Age of Onset   Obesity Mother    Diabetes Mother    Hypertension Mother     Social History   Tobacco Use   Smoking status: Light Tobacco Smoker    Packs/day: 0.10    Types: Cigarettes    Last attempt to quit: 03/07/2014    Years since quitting: 6.2   Smokeless tobacco: Never Used   Tobacco comment: smokes marajuana at times  Vaping Use   Vaping Use: Never used  Substance Use Topics   Alcohol use: Yes    Comment: ocassionally   Drug use: No    Prior to Admission medications   Medication Sig Start Date End Date Taking? Authorizing Provider  cetirizine (ZYRTEC) 10 MG tablet Take 1 tablet (10 mg total) by mouth daily. 08/05/19 09/20/19  Dollene Cleveland, DO  fluticasone (FLONASE) 50 MCG/ACT nasal spray Place 1 spray into both nostrils daily. 1 spray in each nostril every day 08/05/19 09/20/19  Dollene Cleveland, DO  Allergies Shellfish-derived products   REVIEW OF SYSTEMS  Negative except as noted here or in the History of Present Illness.   PHYSICAL EXAMINATION  Initial Vital Signs Blood pressure 125/75, pulse 99, temperature 98.6 F (37 C), temperature source Oral, resp. rate 18, height 5\' 9"  (1.753 m), weight 59 kg, SpO2 96 %.  Examination General: Well-developed, well-nourished female in no acute distress; appearance consistent with age of record HENT: normocephalic; atraumatic; TMs normal; no pharyngeal erythema or exudate Eyes: pupils equal, round and reactive to light; extraocular muscles intact Neck: supple Heart: regular rate and rhythm Lungs: clear to auscultation bilaterally Abdomen: soft; nondistended;  nontender; bowel sounds present Extremities: No deformity; full range of motion; pulses normal Neurologic: Awake, alert and oriented; motor function intact in all extremities and symmetric; no facial droop Skin: Warm and dry Psychiatric: Repetitious speech; suicidal ideation   RESULTS  Summary of this visit's results, reviewed and interpreted by myself:   EKG Interpretation  Date/Time:    Ventricular Rate:    PR Interval:    QRS Duration:   QT Interval:    QTC Calculation:   R Axis:     Text Interpretation:        Laboratory Studies: Results for orders placed or performed during the hospital encounter of 05/28/20 (from the past 24 hour(s))  Rapid urine drug screen (hospital performed)     Status: None   Collection Time: 05/28/20  1:43 AM  Result Value Ref Range   Opiates NONE DETECTED NONE DETECTED   Cocaine NONE DETECTED NONE DETECTED   Benzodiazepines NONE DETECTED NONE DETECTED   Amphetamines NONE DETECTED NONE DETECTED   Tetrahydrocannabinol NONE DETECTED NONE DETECTED   Barbiturates NONE DETECTED NONE DETECTED  Ethanol     Status: Abnormal   Collection Time: 05/28/20  1:43 AM  Result Value Ref Range   Alcohol, Ethyl (B) 227 (H) <10 mg/dL  Pregnancy, urine     Status: None   Collection Time: 05/28/20  1:43 AM  Result Value Ref Range   Preg Test, Ur NEGATIVE NEGATIVE  CBC with Differential/Platelet     Status: None   Collection Time: 05/28/20  1:43 AM  Result Value Ref Range   WBC 4.6 4.0 - 10.5 K/uL   RBC 4.58 3.87 - 5.11 MIL/uL   Hemoglobin 12.8 12.0 - 15.0 g/dL   HCT 05/30/20 36 - 46 %   MCV 83.0 80.0 - 100.0 fL   MCH 27.9 26.0 - 34.0 pg   MCHC 33.7 30.0 - 36.0 g/dL   RDW 27.5 17.0 - 01.7 %   Platelets 323 150 - 400 K/uL   nRBC 0.0 0.0 - 0.2 %   Neutrophils Relative % 63 %   Neutro Abs 2.9 1.7 - 7.7 K/uL   Lymphocytes Relative 27 %   Lymphs Abs 1.3 0.7 - 4.0 K/uL   Monocytes Relative 8 %   Monocytes Absolute 0.4 0 - 1 K/uL   Eosinophils Relative 0 %    Eosinophils Absolute 0.0 0 - 0 K/uL   Basophils Relative 1 %   Basophils Absolute 0.0 0 - 0 K/uL   Immature Granulocytes 1 %   Abs Immature Granulocytes 0.03 0.00 - 0.07 K/uL  Basic metabolic panel     Status: Abnormal   Collection Time: 05/28/20  1:43 AM  Result Value Ref Range   Sodium 144 135 - 145 mmol/L   Potassium 3.9 3.5 - 5.1 mmol/L   Chloride 108 98 - 111 mmol/L   CO2  24 22 - 32 mmol/L   Glucose, Bld 111 (H) 70 - 99 mg/dL   BUN 10 6 - 20 mg/dL   Creatinine, Ser 5.85 0.44 - 1.00 mg/dL   Calcium 9.1 8.9 - 92.9 mg/dL   GFR calc non Af Amer >60 >60 mL/min   GFR calc Af Amer >60 >60 mL/min   Anion gap 12 5 - 15   Imaging Studies: No results found.  ED COURSE and MDM  Nursing notes, initial and subsequent vitals signs, including pulse oximetry, reviewed and interpreted by myself.  Vitals:   05/28/20 0115  BP: 125/75  Pulse: 99  Resp: 18  Temp: 98.6 F (37 C)  TempSrc: Oral  SpO2: 96%  Weight: 59 kg  Height: 5\' 9"  (1.753 m)   Medications  alum & mag hydroxide-simeth (MAALOX/MYLANTA) 200-200-20 MG/5ML suspension 30 mL (has no administration in time range)  ondansetron (ZOFRAN) tablet 4 mg (has no administration in time range)  ibuprofen (ADVIL) tablet 600 mg (has no administration in time range)    2:00 AM Involuntary commitment papers completed.  PROCEDURES  Procedures   ED DIAGNOSES     ICD-10-CM   1. Suicidal ideation  R45.851   2. Suicide attempt (HCC)  T14.91XA   3. Alcoholic intoxication with complication (HCC)  F10.929        Britain Anagnos, , MD 05/28/20 [phone]
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
## 2020-05-28 NOTE — ED Notes (Signed)
Pt refusing to change out into scrubs until she knows how long she will be here.

## 2020-06-26 DIAGNOSIS — Z319 Encounter for procreative management, unspecified: Secondary | ICD-10-CM | POA: Diagnosis not present

## 2020-06-26 DIAGNOSIS — Z1159 Encounter for screening for other viral diseases: Secondary | ICD-10-CM | POA: Diagnosis not present

## 2020-06-26 DIAGNOSIS — Z113 Encounter for screening for infections with a predominantly sexual mode of transmission: Secondary | ICD-10-CM | POA: Diagnosis not present

## 2020-07-07 DIAGNOSIS — Z113 Encounter for screening for infections with a predominantly sexual mode of transmission: Secondary | ICD-10-CM | POA: Diagnosis not present

## 2020-07-21 DIAGNOSIS — Z319 Encounter for procreative management, unspecified: Secondary | ICD-10-CM | POA: Diagnosis not present

## 2020-07-28 ENCOUNTER — Encounter (HOSPITAL_COMMUNITY): Payer: Self-pay

## 2020-07-28 ENCOUNTER — Ambulatory Visit (HOSPITAL_COMMUNITY)
Admission: EM | Admit: 2020-07-28 | Discharge: 2020-07-28 | Disposition: A | Discharge status: Home / Self Care | Payer: BC Managed Care – PPO | Attending: Family Medicine | Admitting: Family Medicine

## 2020-07-28 ENCOUNTER — Other Ambulatory Visit: Payer: Self-pay

## 2020-07-28 DIAGNOSIS — Z87891 Personal history of nicotine dependence: Secondary | ICD-10-CM | POA: Diagnosis not present

## 2020-07-28 DIAGNOSIS — J029 Acute pharyngitis, unspecified: Secondary | ICD-10-CM

## 2020-07-28 DIAGNOSIS — Z20822 Contact with and (suspected) exposure to covid-19: Secondary | ICD-10-CM | POA: Diagnosis not present

## 2020-07-28 LAB — POCT RAPID STREP A, ED / UC: Streptococcus, Group A Screen (Direct): NEGATIVE

## 2020-07-28 MED ORDER — CETIRIZINE HCL 10 MG PO TABS: 10.0000 mg | ORAL_TABLET | Freq: Every day | ORAL | 0 refills | Status: AC

## 2020-07-28 MED ORDER — FLUTICASONE PROPIONATE 50 MCG/ACT NA SUSP: 1.0000 | Freq: Every day | NASAL | 2 refills | Status: AC

## 2020-07-28 NOTE — Discharge Instructions (Addendum)
We are checking your thyroid.  I believe this is allergy related.  Start back on Zyrtec and Flonase daily. Rest to make sure you are staying hydrated You can check your MyChart for Covid results

## 2020-07-28 NOTE — ED Triage Notes (Signed)
Pt c/o sore throat and dysphagiax1-2 wks. Pt states when she went to dentist 2 days ago to get braces taken care of and she sat up for a lying position and her throat was very swollen.
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
## 2020-07-29 LAB — SARS CORONAVIRUS 2 (TAT 6-24 HRS): SARS Coronavirus 2: NEGATIVE

## 2020-07-31 LAB — CULTURE, GROUP A STREP (THRC)

## 2020-08-07 DIAGNOSIS — Z113 Encounter for screening for infections with a predominantly sexual mode of transmission: Secondary | ICD-10-CM | POA: Diagnosis not present

## 2020-11-05 ENCOUNTER — Ambulatory Visit (INDEPENDENT_AMBULATORY_CARE_PROVIDER_SITE_OTHER): Payer: BC Managed Care – PPO

## 2020-11-05 ENCOUNTER — Ambulatory Visit (HOSPITAL_COMMUNITY)
Admission: EM | Admit: 2020-11-05 | Discharge: 2020-11-05 | Disposition: A | Discharge status: Home / Self Care | Payer: BC Managed Care – PPO | Attending: Family Medicine | Admitting: Family Medicine

## 2020-11-05 ENCOUNTER — Emergency Department (HOSPITAL_COMMUNITY)
Admission: EM | Admit: 2020-11-05 | Discharge: 2020-11-05 | Discharge status: Against Medical Advice | Payer: BC Managed Care – PPO

## 2020-11-05 ENCOUNTER — Encounter (HOSPITAL_COMMUNITY): Payer: Self-pay

## 2020-11-05 ENCOUNTER — Other Ambulatory Visit: Payer: Self-pay

## 2020-11-05 DIAGNOSIS — R059 Cough, unspecified: Secondary | ICD-10-CM

## 2020-11-05 DIAGNOSIS — J069 Acute upper respiratory infection, unspecified: Secondary | ICD-10-CM

## 2020-11-05 DIAGNOSIS — R0602 Shortness of breath: Secondary | ICD-10-CM

## 2020-11-05 DIAGNOSIS — U071 COVID-19: Secondary | ICD-10-CM | POA: Diagnosis not present

## 2020-11-05 MED ORDER — ALBUTEROL SULFATE HFA 108 (90 BASE) MCG/ACT IN AERS: 1.0000 | INHALATION_SPRAY | Freq: Four times a day (QID) | RESPIRATORY_TRACT | 0 refills | Status: AC | PRN

## 2020-11-05 MED ORDER — PROMETHAZINE-DM 6.25-15 MG/5ML PO SYRP: 5.0000 mL | ORAL_SOLUTION | Freq: Four times a day (QID) | ORAL | 0 refills | Status: AC | PRN

## 2020-11-05 NOTE — ED Triage Notes (Signed)
Patient states she has had chest pain since last night. Pt describes it as chest congestion. Pt states her chest feels tight and she appears anxious about covid. Pt states she was in the ER last night but left prior to being seen. Pt is aox4 and ambulatory.

## 2020-11-05 NOTE — ED Notes (Signed)
Pt called for triage X3. No answer. Pt told registration she was taking her kids back to the car to wait for her. Unable to triage, get vitals, or ekg at this time.
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
## 2020-11-06 LAB — SARS CORONAVIRUS 2 (TAT 6-24 HRS): SARS Coronavirus 2: POSITIVE — AB

## 2020-11-19 ENCOUNTER — Other Ambulatory Visit: Payer: Self-pay | Admitting: Family Medicine

## 2020-12-10 DIAGNOSIS — Z03818 Encounter for observation for suspected exposure to other biological agents ruled out: Secondary | ICD-10-CM | POA: Diagnosis not present

## 2020-12-10 DIAGNOSIS — F10129 Alcohol abuse with intoxication, unspecified: Secondary | ICD-10-CM | POA: Diagnosis not present

## 2020-12-11 ENCOUNTER — Encounter (HOSPITAL_COMMUNITY): Payer: Self-pay | Admitting: Emergency Medicine

## 2020-12-11 ENCOUNTER — Emergency Department (HOSPITAL_COMMUNITY)
Admission: EM | Admit: 2020-12-11 | Discharge: 2020-12-11 | Disposition: A | Discharge status: Home / Self Care | Payer: BC Managed Care – PPO | Attending: Emergency Medicine | Admitting: Emergency Medicine

## 2020-12-11 ENCOUNTER — Other Ambulatory Visit: Payer: Self-pay

## 2020-12-11 DIAGNOSIS — Z03818 Encounter for observation for suspected exposure to other biological agents ruled out: Secondary | ICD-10-CM | POA: Diagnosis not present

## 2020-12-11 DIAGNOSIS — Z79899 Other long term (current) drug therapy: Secondary | ICD-10-CM | POA: Insufficient documentation

## 2020-12-11 DIAGNOSIS — Z87891 Personal history of nicotine dependence: Secondary | ICD-10-CM | POA: Insufficient documentation

## 2020-12-11 DIAGNOSIS — J4540 Moderate persistent asthma, uncomplicated: Secondary | ICD-10-CM | POA: Insufficient documentation

## 2020-12-11 DIAGNOSIS — F10129 Alcohol abuse with intoxication, unspecified: Secondary | ICD-10-CM | POA: Diagnosis not present

## 2020-12-11 DIAGNOSIS — R6884 Jaw pain: Secondary | ICD-10-CM | POA: Diagnosis not present

## 2020-12-11 DIAGNOSIS — G2402 Drug induced acute dystonia: Secondary | ICD-10-CM

## 2020-12-11 DIAGNOSIS — F29 Unspecified psychosis not due to a substance or known physiological condition: Secondary | ICD-10-CM | POA: Diagnosis not present

## 2020-12-11 DIAGNOSIS — R52 Pain, unspecified: Secondary | ICD-10-CM | POA: Diagnosis not present

## 2020-12-11 LAB — CBC WITH DIFFERENTIAL/PLATELET
Abs Immature Granulocytes: 0.03 10*3/uL (ref 0.00–0.07)
Basophils Absolute: 0 10*3/uL (ref 0.0–0.1)
Basophils Relative: 1 %
Eosinophils Absolute: 0 10*3/uL (ref 0.0–0.5)
Eosinophils Relative: 0 %
HCT: 38.9 % (ref 36.0–46.0)
Hemoglobin: 12.9 g/dL (ref 12.0–15.0)
Immature Granulocytes: 0 %
Lymphocytes Relative: 13 %
Lymphs Abs: 1 10*3/uL (ref 0.7–4.0)
MCH: 27.5 pg (ref 26.0–34.0)
MCHC: 33.2 g/dL (ref 30.0–36.0)
MCV: 82.9 fL (ref 80.0–100.0)
Monocytes Absolute: 0.4 10*3/uL (ref 0.1–1.0)
Monocytes Relative: 5 %
Neutro Abs: 6.2 10*3/uL (ref 1.7–7.7)
Neutrophils Relative %: 81 %
Platelets: 287 10*3/uL (ref 150–400)
RBC: 4.69 MIL/uL (ref 3.87–5.11)
RDW: 13.4 % (ref 11.5–15.5)
WBC: 7.6 10*3/uL (ref 4.0–10.5)
nRBC: 0 % (ref 0.0–0.2)

## 2020-12-11 LAB — BASIC METABOLIC PANEL
Anion gap: 10 (ref 5–15)
BUN: 9 mg/dL (ref 6–20)
CO2: 24 mmol/L (ref 22–32)
Calcium: 9.3 mg/dL (ref 8.9–10.3)
Chloride: 101 mmol/L (ref 98–111)
Creatinine, Ser: 0.81 mg/dL (ref 0.44–1.00)
GFR, Estimated: >60 mL/min (ref >60)
Glucose, Bld: 112 mg/dL — ABNORMAL HIGH (ref 70–99)
Potassium: 3.5 mmol/L (ref 3.5–5.1)
Sodium: 135 mmol/L (ref 135–145)

## 2020-12-11 LAB — I-STAT BETA HCG BLOOD, ED (MC, WL, AP ONLY): I-stat hCG, quantitative: <5 m[IU]/mL (ref <5)

## 2020-12-11 MED ORDER — DIPHENHYDRAMINE HCL 50 MG/ML IJ SOLN
50.0000 mg | Freq: Once | INTRAMUSCULAR | Status: AC
  Administered 2020-12-11: 50 mg via INTRAVENOUS
  Filled 2020-12-11: qty 1

## 2020-12-11 NOTE — Discharge Instructions (Signed)
Take Benadryl if symptoms return.  Return to the ER for any worsening or concerning symptoms.
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
## 2020-12-26 DIAGNOSIS — F1010 Alcohol abuse, uncomplicated: Secondary | ICD-10-CM | POA: Diagnosis not present

## 2020-12-26 DIAGNOSIS — F331 Major depressive disorder, recurrent, moderate: Secondary | ICD-10-CM | POA: Diagnosis not present

## 2021-01-09 DIAGNOSIS — F1010 Alcohol abuse, uncomplicated: Secondary | ICD-10-CM | POA: Diagnosis not present

## 2021-01-09 DIAGNOSIS — F331 Major depressive disorder, recurrent, moderate: Secondary | ICD-10-CM | POA: Diagnosis not present

## 2021-01-11 ENCOUNTER — Other Ambulatory Visit: Payer: Self-pay

## 2021-01-11 ENCOUNTER — Ambulatory Visit (INDEPENDENT_AMBULATORY_CARE_PROVIDER_SITE_OTHER): Payer: BC Managed Care – PPO | Admitting: Family Medicine

## 2021-01-11 VITALS — BP 129/79 | HR 80 | Temp 99.0°F | Ht 68.0 in | Wt 148.2 lb

## 2021-01-11 DIAGNOSIS — J029 Acute pharyngitis, unspecified: Secondary | ICD-10-CM | POA: Diagnosis not present

## 2021-01-11 DIAGNOSIS — A084 Viral intestinal infection, unspecified: Secondary | ICD-10-CM | POA: Diagnosis not present

## 2021-01-11 LAB — POCT RAPID STREP A (OFFICE): Rapid Strep A Screen: NEGATIVE

## 2021-01-11 MED ORDER — ONDANSETRON HCL 4 MG PO TABS: 4.0000 mg | ORAL_TABLET | Freq: Three times a day (TID) | ORAL | 0 refills | Status: AC | PRN

## 2021-01-11 NOTE — Progress Notes (Signed)
    SUBJECTIVE:   CHIEF COMPLAINT / HPI: fever, sore throat, emesis   Patient reports for over 1 week she has been experiencing GI upset with nausea and vomiting.  She has not been able to eat.  Patient states that last week she had a cough as well as a fever to 102.  Both have resolved at this time.  She is unaware of any sick contacts other than her son who had a GI bug and had to remain out of daycare.  She reports last bowel movement was this morning.  She states that she has been having diarrhea.  Patient denies any shortness of breath.  She reports that last week she had a sore throat.  She has been having headaches and myalgias as well.  Patient noted to have COVID-19 in January of this year.  She received 2 vaccinations for Covid.    OBJECTIVE:   BP 129/79   Pulse 80   Temp 99 F (37.2 C) (Oral)   Ht 5\' 8"  (1.727 m)   Wt 148 lb 3.2 oz (67.2 kg)   LMP 12/14/2020 (Approximate)   SpO2 99%   BMI 22.53 kg/m   General: female appearing stated age, ill appearing but non toxic  HEENT: MMM, midline ,Neck non-tender without lymphadenopathy, masses or thyromegaly, midline  palate 1-67mm ulceration,no tonsillar exudate  Or tonsillar enlargement Cardio: Normal S1 and S2, no S3 or S4. Rhythm is regular. No murmurs or rubs.  Bilateral radial pulses palpable Pulm: Clear to auscultation bilaterally, no crackles, wheezing, or mildly diminished breath sounds bilatearlly. Normal respiratory effort, stable on RA Abdomen: Bowel sounds normal. Abdomen soft and non-tender.  Extremities: No peripheral edema. Warm/ well perfused.    ASSESSMENT/PLAN:   Viral gastroenteritis Patient with known history of cyclical emesis as well as sick contact (son with emesis and diarrhea) presenting with recent upper respiratory symptoms as GI symptoms. Respiratory symptoms have improved however she has not bene able to eat much due to nausea, emesis and diarrhea. Does not appear to be dehydrated on exam with brisk cap  refill and moist appearing mucous membranes. Patient married to female and denies sexual activity with males, pregnancy less likely.  Prescribed Zofran  Negative strep test today  Have patient follow up if symptoms do not improve by next week     3m, MD [facility]

## 2021-01-11 NOTE — Patient Instructions (Signed)
  It is likely that you have a viral infection that is causing your diarrhea and vomiting.  It is also likely that she had an upper respiratory infection that is improving. I have prescribed a medication to help you with any vomiting.  Please notify our office if you are not feeling any better on Monday as we may need to do lab work.  Your strep test was negative     Food Choices to Help Relieve Diarrhea, Adult Diarrhea can make you feel weak and cause you to become dehydrated. It is important to choose the right foods and drinks to:  Relieve diarrhea.  Replace lost fluids and nutrients.  Prevent dehydration. What are tips for following this plan? Relieving diarrhea  Avoid foods that make your diarrhea worse. These may include: ? Foods and beverages sweetened with high-fructose corn syrup, honey, or sweeteners such as xylitol, sorbitol, and mannitol. ? Fried, greasy, or spicy foods. ? Raw fruits and vegetables.  Eat foods that are rich in probiotics. These include foods such as yogurt and fermented milk products. Probiotics can help increase healthy bacteria in your stomach and intestines (gastrointestinal tract or GI tract). This may help digestion and stop diarrhea.  If you have lactose intolerance, avoid dairy products. These may make your diarrhea worse.  Take medicine to help stop diarrhea only as told by your health care provider. Replacing nutrients  Eat bland, easy-to-digest foods in small amounts as you are able, until your diarrhea starts to get better. These foods include bananas, applesauce, rice, toast, and crackers.  Gradually reintroduce nutrient-rich foods as tolerated or as told by your health care provider. This includes: ? Well-cooked protein foods, such as eggs, lean meats like fish or chicken without skin, and tofu. ? Peeled, seeded, and soft-cooked fruits and vegetables. ? Low-fat dairy products. ? Whole grains.  Take vitamin and mineral supplements as  told by your health care provider.   Preventing dehydration  Start by sipping water or a solution to prevent dehydration (oral rehydration solution, ORS). This is a drink that helps replace fluids and minerals your body has lost. You can buy an ORS at pharmacies and retail stores.  Try to drink at least 8-10 cups (2,000-2,500 mL) of fluid each day to help replace lost fluids. If you have urine that is pale yellow, you are getting enough fluids.  You may drink other liquids in addition to water, such as fruit juice that you have added water to (diluted fruit juice) or low-calorie sports drinks, as tolerated or as told by your health care provider.  Avoid drinks with caffeine, such as coffee, tea, or soft drinks.  Avoid alcohol.   Summary  When you have diarrhea, it is important to choose the right foods and drinks to relieve diarrhea, to replace lost fluids and nutrients, and to prevent dehydration.  Make sure you drink enough fluid to keep your urine pale yellow.  You may benefit from eating bland foods at first. Gradually reintroduce healthy, nutrient-rich foods as tolerated or as told by your health care provider.  Avoid foods that make your diarrhea worse, such as fried, greasy, or spicy foods. This information is not intended to replace advice given to you by your health care provider. Make sure you discuss any questions you have with your health care provider. Document Revised: 12/07/2019 Document Reviewed: 12/07/2019 Elsevier Patient Education  2021 [employer].

## 2021-01-14 DIAGNOSIS — A084 Viral intestinal infection, unspecified: Secondary | ICD-10-CM | POA: Insufficient documentation

## 2021-01-14 NOTE — Assessment & Plan Note (Signed)
Patient with known history of cyclical emesis as well as sick contact (son with emesis and diarrhea) presenting with recent upper respiratory symptoms as GI symptoms. Respiratory symptoms have improved however she has not bene able to eat much due to nausea, emesis and diarrhea. Does not appear to be dehydrated on exam with brisk cap refill and moist appearing mucous membranes. Patient married to female and denies sexual activity with males, pregnancy less likely.  Prescribed Zofran  Negative strep test today  Have patient follow up if symptoms do not improve by next week

## 2021-01-26 DIAGNOSIS — F1010 Alcohol abuse, uncomplicated: Secondary | ICD-10-CM | POA: Diagnosis not present

## 2021-01-26 DIAGNOSIS — F331 Major depressive disorder, recurrent, moderate: Secondary | ICD-10-CM | POA: Diagnosis not present

## 2021-02-09 DIAGNOSIS — F1010 Alcohol abuse, uncomplicated: Secondary | ICD-10-CM | POA: Diagnosis not present

## 2021-02-09 DIAGNOSIS — F331 Major depressive disorder, recurrent, moderate: Secondary | ICD-10-CM | POA: Diagnosis not present

## 2021-02-15 ENCOUNTER — Ambulatory Visit (HOSPITAL_COMMUNITY)
Admission: EM | Admit: 2021-02-15 | Discharge: 2021-02-15 | Disposition: A | Discharge status: Home / Self Care | Payer: BC Managed Care – PPO | Attending: Family Medicine | Admitting: Family Medicine

## 2021-02-15 ENCOUNTER — Other Ambulatory Visit: Payer: Self-pay

## 2021-02-15 ENCOUNTER — Telehealth: Payer: Self-pay

## 2021-02-15 ENCOUNTER — Encounter (HOSPITAL_COMMUNITY): Payer: Self-pay

## 2021-02-15 DIAGNOSIS — L989 Disorder of the skin and subcutaneous tissue, unspecified: Secondary | ICD-10-CM | POA: Diagnosis not present

## 2021-02-15 DIAGNOSIS — R1013 Epigastric pain: Secondary | ICD-10-CM | POA: Insufficient documentation

## 2021-02-15 DIAGNOSIS — R197 Diarrhea, unspecified: Secondary | ICD-10-CM | POA: Insufficient documentation

## 2021-02-15 DIAGNOSIS — R112 Nausea with vomiting, unspecified: Secondary | ICD-10-CM | POA: Diagnosis not present

## 2021-02-15 LAB — CBC WITH DIFFERENTIAL/PLATELET
Abs Immature Granulocytes: 0.01 10*3/uL (ref 0.00–0.07)
Basophils Absolute: 0.1 10*3/uL (ref 0.0–0.1)
Basophils Relative: 1 %
Eosinophils Absolute: 0.2 10*3/uL (ref 0.0–0.5)
Eosinophils Relative: 4 %
HCT: 38.5 % (ref 36.0–46.0)
Hemoglobin: 12.5 g/dL (ref 12.0–15.0)
Immature Granulocytes: 0 %
Lymphocytes Relative: 31 %
Lymphs Abs: 1.3 10*3/uL (ref 0.7–4.0)
MCH: 27.6 pg (ref 26.0–34.0)
MCHC: 32.5 g/dL (ref 30.0–36.0)
MCV: 85 fL (ref 80.0–100.0)
Monocytes Absolute: 0.4 10*3/uL (ref 0.1–1.0)
Monocytes Relative: 10 %
Neutro Abs: 2.3 10*3/uL (ref 1.7–7.7)
Neutrophils Relative %: 54 %
Platelets: 272 10*3/uL (ref 150–400)
RBC: 4.53 MIL/uL (ref 3.87–5.11)
RDW: 13.2 % (ref 11.5–15.5)
WBC: 4.3 10*3/uL (ref 4.0–10.5)
nRBC: 0 % (ref 0.0–0.2)

## 2021-02-15 LAB — COMPREHENSIVE METABOLIC PANEL
ALT: 15 U/L (ref 0–44)
AST: 17 U/L (ref 15–41)
Albumin: 3.9 g/dL (ref 3.5–5.0)
Alkaline Phosphatase: 49 U/L (ref 38–126)
Anion gap: 5 (ref 5–15)
BUN: 9 mg/dL (ref 6–20)
CO2: 29 mmol/L (ref 22–32)
Calcium: 9.4 mg/dL (ref 8.9–10.3)
Chloride: 108 mmol/L (ref 98–111)
Creatinine, Ser: 0.85 mg/dL (ref 0.44–1.00)
GFR, Estimated: >60 mL/min (ref >60)
Glucose, Bld: 67 mg/dL — ABNORMAL LOW (ref 70–99)
Potassium: 3.7 mmol/L (ref 3.5–5.1)
Sodium: 142 mmol/L (ref 135–145)
Total Bilirubin: 0.8 mg/dL (ref 0.3–1.2)
Total Protein: 6.6 g/dL (ref 6.5–8.1)

## 2021-02-15 LAB — LIPASE, BLOOD: Lipase: 28 U/L (ref 11–51)

## 2021-02-15 MED ORDER — SUCRALFATE 1 G PO TABS: 1.0000 g | ORAL_TABLET | Freq: Three times a day (TID) | ORAL | 1 refills | Status: AC | PRN

## 2021-02-15 MED ORDER — SILVER SULFADIAZINE 1 % EX CREA: 1.0000 "application " | TOPICAL_CREAM | Freq: Two times a day (BID) | CUTANEOUS | 0 refills | Status: AC

## 2021-02-15 MED ORDER — PANTOPRAZOLE SODIUM 40 MG PO TBEC: 40.0000 mg | DELAYED_RELEASE_TABLET | Freq: Two times a day (BID) | ORAL | 2 refills | Status: AC
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
## 2021-02-15 NOTE — ED Triage Notes (Signed)
Pt presents with c/o abdominal pain for past 3 weeks, pt has h/o gi ulcers and has had diarrhea

## 2021-02-15 NOTE — Telephone Encounter (Signed)
Patient calls nurse line requesting to schedule appointment for abdominal pain. Patient reports pain has been intermittent for the last 3 weeks, however, has worsened over the last few days. Patient also reports history of GI ulcers.   Advised patient to be evaluated in UC, as we do not have any appointments until next week.   Patient verbalizes understanding.   Veronda Prude, RN

## 2021-03-15 DIAGNOSIS — F1010 Alcohol abuse, uncomplicated: Secondary | ICD-10-CM | POA: Diagnosis not present

## 2021-03-15 DIAGNOSIS — F331 Major depressive disorder, recurrent, moderate: Secondary | ICD-10-CM | POA: Diagnosis not present

## 2021-04-04 ENCOUNTER — Emergency Department (HOSPITAL_COMMUNITY): Payer: Self-pay

## 2021-04-04 ENCOUNTER — Encounter (HOSPITAL_COMMUNITY): Payer: Self-pay

## 2021-04-04 ENCOUNTER — Emergency Department (HOSPITAL_COMMUNITY)
Admission: EM | Admit: 2021-04-04 | Discharge: 2021-04-04 | Disposition: A | Discharge status: Home / Self Care | Payer: Self-pay | Attending: Emergency Medicine | Admitting: Emergency Medicine

## 2021-04-04 ENCOUNTER — Other Ambulatory Visit: Payer: Self-pay

## 2021-04-04 DIAGNOSIS — S060X1A Concussion with loss of consciousness of 30 minutes or less, initial encounter: Secondary | ICD-10-CM | POA: Insufficient documentation

## 2021-04-04 DIAGNOSIS — Z87891 Personal history of nicotine dependence: Secondary | ICD-10-CM | POA: Insufficient documentation

## 2021-04-04 DIAGNOSIS — J45909 Unspecified asthma, uncomplicated: Secondary | ICD-10-CM | POA: Insufficient documentation

## 2021-04-04 DIAGNOSIS — S0512XA Contusion of eyeball and orbital tissues, left eye, initial encounter: Secondary | ICD-10-CM | POA: Insufficient documentation

## 2021-04-04 DIAGNOSIS — M542 Cervicalgia: Secondary | ICD-10-CM | POA: Insufficient documentation

## 2021-04-04 LAB — CBC WITH DIFFERENTIAL/PLATELET
Abs Immature Granulocytes: 0.03 10*3/uL (ref 0.00–0.07)
Basophils Absolute: 0 10*3/uL (ref 0.0–0.1)
Basophils Relative: 0 %
Eosinophils Absolute: 0.2 10*3/uL (ref 0.0–0.5)
Eosinophils Relative: 3 %
HCT: 37.3 % (ref 36.0–46.0)
Hemoglobin: 12.3 g/dL (ref 12.0–15.0)
Immature Granulocytes: 1 %
Lymphocytes Relative: 22 %
Lymphs Abs: 1.5 10*3/uL (ref 0.7–4.0)
MCH: 27.5 pg (ref 26.0–34.0)
MCHC: 33 g/dL (ref 30.0–36.0)
MCV: 83.4 fL (ref 80.0–100.0)
Monocytes Absolute: 0.7 10*3/uL (ref 0.1–1.0)
Monocytes Relative: 11 %
Neutro Abs: 4.2 10*3/uL (ref 1.7–7.7)
Neutrophils Relative %: 63 %
Platelets: 255 10*3/uL (ref 150–400)
RBC: 4.47 MIL/uL (ref 3.87–5.11)
RDW: 13.1 % (ref 11.5–15.5)
WBC: 6.6 10*3/uL (ref 4.0–10.5)
nRBC: 0 % (ref 0.0–0.2)

## 2021-04-04 LAB — I-STAT CHEM 8, ED
BUN: 8 mg/dL (ref 6–20)
Calcium, Ion: 1.22 mmol/L (ref 1.15–1.40)
Chloride: 103 mmol/L (ref 98–111)
Creatinine, Ser: 0.9 mg/dL (ref 0.44–1.00)
Glucose, Bld: 115 mg/dL — ABNORMAL HIGH (ref 70–99)
HCT: 39 % (ref 36.0–46.0)
Hemoglobin: 13.3 g/dL (ref 12.0–15.0)
Potassium: 3.6 mmol/L (ref 3.5–5.1)
Sodium: 141 mmol/L (ref 135–145)
TCO2: 26 mmol/L (ref 22–32)

## 2021-04-04 LAB — I-STAT BETA HCG BLOOD, ED (MC, WL, AP ONLY): I-stat hCG, quantitative: <5 m[IU]/mL (ref <5)

## 2021-04-04 MED ORDER — IOHEXOL 350 MG/ML SOLN
50.0000 mL | Freq: Once | INTRAVENOUS | Status: AC | PRN
  Administered 2021-04-04: 50 mL via INTRAVENOUS
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
## 2021-04-04 NOTE — Discharge Instructions (Signed)
Follow up with your doctor.  Return for worsening symptoms.  
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
## 2021-04-04 NOTE — ED Notes (Signed)
Patient transported to CT 

## 2021-04-11 ENCOUNTER — Other Ambulatory Visit: Payer: Self-pay

## 2021-04-11 ENCOUNTER — Encounter: Payer: Self-pay | Admitting: Family Medicine

## 2021-04-11 ENCOUNTER — Ambulatory Visit (INDEPENDENT_AMBULATORY_CARE_PROVIDER_SITE_OTHER): Payer: Medicaid Other | Admitting: Family Medicine

## 2021-04-11 DIAGNOSIS — S0512XS Contusion of eyeball and orbital tissues, left eye, sequela: Secondary | ICD-10-CM

## 2021-04-11 DIAGNOSIS — S060XAA Concussion with loss of consciousness status unknown, initial encounter: Secondary | ICD-10-CM | POA: Insufficient documentation

## 2021-04-11 DIAGNOSIS — S0591XA Unspecified injury of right eye and orbit, initial encounter: Secondary | ICD-10-CM | POA: Insufficient documentation

## 2021-04-11 DIAGNOSIS — S060X1D Concussion with loss of consciousness of 30 minutes or less, subsequent encounter: Secondary | ICD-10-CM

## 2021-04-11 DIAGNOSIS — S0591XD Unspecified injury of right eye and orbit, subsequent encounter: Secondary | ICD-10-CM
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
## 2021-04-11 NOTE — Patient Instructions (Signed)
You definitely have a concussion.  Limit your activity to symptoms.  It may take weeks for your symptoms to fully resolve. I am mildly worried about your right eye.  I have put in a referral to the eye doctor.  Someone should call with the appointment. I doubt you have a fracture of the bone under your left eye.  In the unlikely event it is actually broken, it is not out of place and should heal on its own.

## 2021-04-11 NOTE — Assessment & Plan Note (Signed)
Still tender.  No fracture seen on facial CT. Anticipate gradual resolution.
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
## 2021-04-11 NOTE — Assessment & Plan Note (Signed)
Reassuring exam.  However, with apparent decreased visual acuity and photophobia.  Will refer to ophthalmology to confirm no important globe injury.

## 2021-05-12 ENCOUNTER — Encounter (HOSPITAL_COMMUNITY): Payer: Self-pay | Admitting: Emergency Medicine

## 2021-05-12 ENCOUNTER — Emergency Department (HOSPITAL_COMMUNITY)
Admission: EM | Admit: 2021-05-12 | Discharge: 2021-05-12 | Disposition: A | Discharge status: Home / Self Care | Payer: Self-pay | Attending: Emergency Medicine | Admitting: Emergency Medicine

## 2021-05-12 ENCOUNTER — Emergency Department (HOSPITAL_COMMUNITY): Payer: Self-pay

## 2021-05-12 ENCOUNTER — Other Ambulatory Visit: Payer: Self-pay

## 2021-05-12 DIAGNOSIS — Z87891 Personal history of nicotine dependence: Secondary | ICD-10-CM | POA: Insufficient documentation

## 2021-05-12 DIAGNOSIS — S30810A Abrasion of lower back and pelvis, initial encounter: Secondary | ICD-10-CM | POA: Insufficient documentation

## 2021-05-12 DIAGNOSIS — S2232XA Fracture of one rib, left side, initial encounter for closed fracture: Secondary | ICD-10-CM | POA: Insufficient documentation

## 2021-05-12 DIAGNOSIS — J45909 Unspecified asthma, uncomplicated: Secondary | ICD-10-CM | POA: Insufficient documentation

## 2021-05-12 DIAGNOSIS — S70212A Abrasion, left hip, initial encounter: Secondary | ICD-10-CM | POA: Insufficient documentation

## 2021-05-12 LAB — BASIC METABOLIC PANEL
Anion gap: 11 (ref 5–15)
BUN: 9 mg/dL (ref 6–20)
CO2: 22 mmol/L (ref 22–32)
Calcium: 9.1 mg/dL (ref 8.9–10.3)
Chloride: 108 mmol/L (ref 98–111)
Creatinine, Ser: 0.91 mg/dL (ref 0.44–1.00)
GFR, Estimated: >60 mL/min (ref >60)
Glucose, Bld: 110 mg/dL — ABNORMAL HIGH (ref 70–99)
Potassium: 3.5 mmol/L (ref 3.5–5.1)
Sodium: 141 mmol/L (ref 135–145)

## 2021-05-12 LAB — URINALYSIS, ROUTINE W REFLEX MICROSCOPIC
Bilirubin Urine: NEGATIVE
Glucose, UA: NEGATIVE mg/dL
Hgb urine dipstick: NEGATIVE
Ketones, ur: NEGATIVE mg/dL
Leukocytes,Ua: NEGATIVE
Nitrite: NEGATIVE
Protein, ur: NEGATIVE mg/dL
Specific Gravity, Urine: 1.004 — ABNORMAL LOW (ref 1.005–1.030)
pH: 6 (ref 5.0–8.0)

## 2021-05-12 LAB — CBC
HCT: 36.4 % (ref 36.0–46.0)
Hemoglobin: 12 g/dL (ref 12.0–15.0)
MCH: 27.3 pg (ref 26.0–34.0)
MCHC: 33 g/dL (ref 30.0–36.0)
MCV: 82.9 fL (ref 80.0–100.0)
Platelets: 297 10*3/uL (ref 150–400)
RBC: 4.39 MIL/uL (ref 3.87–5.11)
RDW: 13.4 % (ref 11.5–15.5)
WBC: 7 10*3/uL (ref 4.0–10.5)
nRBC: 0 % (ref 0.0–0.2)

## 2021-05-12 LAB — I-STAT BETA HCG BLOOD, ED (MC, WL, AP ONLY): I-stat hCG, quantitative: <5 m[IU]/mL (ref <5)

## 2021-05-12 MED ORDER — ALBUTEROL SULFATE HFA 108 (90 BASE) MCG/ACT IN AERS: 2.0000 | INHALATION_SPRAY | RESPIRATORY_TRACT | Status: DC | PRN

## 2021-05-12 MED ORDER — HYDROCODONE-ACETAMINOPHEN 5-325 MG PO TABS: 1.0000 | ORAL_TABLET | Freq: Four times a day (QID) | ORAL | 0 refills | Status: AC | PRN

## 2021-05-12 NOTE — Discharge Instructions (Addendum)
Use an incentive spirometer at least once per hour while awake to ensure that you are taking deep breaths.  We advise 600 mg ibuprofen every 6 hours for management of pain.  You may supplement this with Norco as prescribed if pain is severe.  Do not drive or drink alcohol after taking this medication as it may make you drowsy and impair your judgment.  We advise follow-up with a primary care doctor for further assessment of your injuries.  Return for new or concerning symptoms.
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
## 2021-05-12 NOTE — ED Notes (Addendum)
Pt brought back to room with no c-collar on  Pt has not been to XR yet-- order clicked off by mistake  Only has complaints of Left sided rib pain at this time

## 2021-07-05 DEATH — deceased

## 2022-04-09 ENCOUNTER — Encounter: Payer: Self-pay | Admitting: *Deleted
